# Patient Record
Sex: Female | Born: 1940 | Race: White | Hispanic: No | Marital: Married | Smoking: Never smoker
Health system: Southern US, Community
[De-identification: ages and names within clinical notes are randomized; demographics above are authoritative.]

## PROBLEM LIST (undated history)

## (undated) DIAGNOSIS — K635 Polyp of colon: Secondary | ICD-10-CM

## (undated) DIAGNOSIS — R030 Elevated blood-pressure reading, without diagnosis of hypertension: Secondary | ICD-10-CM

## (undated) DIAGNOSIS — M6530 Trigger finger, unspecified finger: Secondary | ICD-10-CM

## (undated) DIAGNOSIS — D369 Benign neoplasm, unspecified site: Secondary | ICD-10-CM

## (undated) DIAGNOSIS — IMO0001 Reserved for inherently not codable concepts without codable children: Secondary | ICD-10-CM

## (undated) DIAGNOSIS — K5790 Diverticulosis of intestine, part unspecified, without perforation or abscess without bleeding: Secondary | ICD-10-CM

## (undated) DIAGNOSIS — N390 Urinary tract infection, site not specified: Secondary | ICD-10-CM

## (undated) DIAGNOSIS — N6019 Diffuse cystic mastopathy of unspecified breast: Secondary | ICD-10-CM

## (undated) DIAGNOSIS — J189 Pneumonia, unspecified organism: Secondary | ICD-10-CM

## (undated) HISTORY — DX: Elevated blood-pressure reading, without diagnosis of hypertension: R03.0

## (undated) HISTORY — DX: Pneumonia, unspecified organism: J18.9

## (undated) HISTORY — DX: Diverticulosis of intestine, part unspecified, without perforation or abscess without bleeding: K57.90

## (undated) HISTORY — DX: Urinary tract infection, site not specified: N39.0

## (undated) HISTORY — PX: REFRACTIVE SURGERY: SHX103

## (undated) HISTORY — DX: Polyp of colon: K63.5

## (undated) HISTORY — PX: TUBAL LIGATION: SHX77

## (undated) HISTORY — DX: Trigger finger, unspecified finger: M65.30

## (undated) HISTORY — DX: Benign neoplasm, unspecified site: D36.9

## (undated) HISTORY — DX: Diffuse cystic mastopathy of unspecified breast: N60.19

## (undated) HISTORY — PX: APPENDECTOMY: SHX54

## (undated) HISTORY — PX: CATARACT EXTRACTION: SUR2

## (undated) HISTORY — DX: Reserved for inherently not codable concepts without codable children: IMO0001

---
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
## 1979-03-23 HISTORY — PX: WISDOM TOOTH EXTRACTION: SHX21

## 1998-10-06 ENCOUNTER — Ambulatory Visit (HOSPITAL_COMMUNITY): Admission: RE | Admit: 1998-10-06 | Discharge: 1998-10-06 | Payer: Self-pay | Admitting: Internal Medicine

## 1998-10-06 ENCOUNTER — Encounter: Payer: Self-pay | Admitting: Internal Medicine

## 1999-04-15 ENCOUNTER — Encounter: Admission: RE | Admit: 1999-04-15 | Discharge: 1999-04-15 | Payer: Self-pay | Admitting: Internal Medicine

## 1999-04-15 ENCOUNTER — Encounter: Payer: Self-pay | Admitting: Internal Medicine

## 2000-04-15 ENCOUNTER — Encounter: Payer: Self-pay | Admitting: Internal Medicine

## 2000-04-15 ENCOUNTER — Encounter: Admission: RE | Admit: 2000-04-15 | Discharge: 2000-04-15 | Payer: Self-pay | Admitting: Internal Medicine

## 2000-04-18 ENCOUNTER — Other Ambulatory Visit: Admission: RE | Admit: 2000-04-18 | Discharge: 2000-04-18 | Payer: Self-pay | Admitting: Obstetrics and Gynecology

## 2000-04-19 ENCOUNTER — Encounter: Payer: Self-pay | Admitting: Internal Medicine

## 2000-04-19 ENCOUNTER — Encounter: Admission: RE | Admit: 2000-04-19 | Discharge: 2000-04-19 | Payer: Self-pay | Admitting: Internal Medicine

## 2001-01-19 ENCOUNTER — Emergency Department (HOSPITAL_COMMUNITY): Admission: EM | Admit: 2001-01-19 | Discharge: 2001-01-20 | Payer: Self-pay | Admitting: Emergency Medicine

## 2002-07-12 ENCOUNTER — Other Ambulatory Visit: Admission: RE | Admit: 2002-07-12 | Discharge: 2002-07-12 | Payer: Self-pay | Admitting: Gynecology

## 2002-08-21 DIAGNOSIS — K635 Polyp of colon: Secondary | ICD-10-CM

## 2002-08-21 HISTORY — DX: Polyp of colon: K63.5

## 2003-08-07 ENCOUNTER — Other Ambulatory Visit: Admission: RE | Admit: 2003-08-07 | Discharge: 2003-08-07 | Payer: Self-pay | Admitting: Gynecology

## 2004-03-22 HISTORY — PX: EXPLORATORY LAPAROTOMY: SUR591

## 2004-07-07 ENCOUNTER — Ambulatory Visit: Payer: Self-pay | Admitting: Internal Medicine

## 2004-07-14 ENCOUNTER — Ambulatory Visit: Payer: Self-pay | Admitting: Internal Medicine

## 2004-08-11 ENCOUNTER — Other Ambulatory Visit: Admission: RE | Admit: 2004-08-11 | Discharge: 2004-08-11 | Payer: Self-pay | Admitting: Gynecology

## 2005-01-13 ENCOUNTER — Ambulatory Visit: Payer: Self-pay | Admitting: Internal Medicine

## 2005-01-22 ENCOUNTER — Ambulatory Visit: Payer: Self-pay | Admitting: Internal Medicine

## 2005-01-24 ENCOUNTER — Ambulatory Visit: Payer: Self-pay | Admitting: Physical Medicine & Rehabilitation

## 2005-01-24 ENCOUNTER — Inpatient Hospital Stay (HOSPITAL_COMMUNITY): Admission: EM | Admit: 2005-01-24 | Discharge: 2005-01-29 | Payer: Self-pay | Admitting: Emergency Medicine

## 2005-01-29 ENCOUNTER — Encounter (HOSPITAL_COMMUNITY)
Admission: RE | Admit: 2005-01-29 | Discharge: 2005-02-03 | Payer: Self-pay | Admitting: Physical Medicine & Rehabilitation

## 2005-01-29 ENCOUNTER — Inpatient Hospital Stay
Admission: RE | Admit: 2005-01-29 | Discharge: 2005-02-09 | Payer: Self-pay | Admitting: Physical Medicine & Rehabilitation

## 2005-03-25 ENCOUNTER — Ambulatory Visit: Payer: Self-pay | Admitting: Internal Medicine

## 2005-06-14 ENCOUNTER — Ambulatory Visit: Payer: Self-pay | Admitting: Internal Medicine

## 2005-08-12 ENCOUNTER — Other Ambulatory Visit: Admission: RE | Admit: 2005-08-12 | Discharge: 2005-08-12 | Payer: Self-pay | Admitting: Gynecology

## 2005-12-14 ENCOUNTER — Ambulatory Visit: Payer: Self-pay | Admitting: Internal Medicine

## 2006-03-04 ENCOUNTER — Ambulatory Visit: Payer: Self-pay | Admitting: Internal Medicine

## 2006-03-10 ENCOUNTER — Ambulatory Visit: Payer: Self-pay | Admitting: Internal Medicine

## 2006-03-22 HISTORY — PX: COLONOSCOPY: SHX174

## 2006-08-03 IMAGING — CT CT ABDOMEN W/O CM
2 of 4 series · 17 of 46 positions shown, 19 images · IV contrast (agent unspecified)
Comparison: 01/24/05.

CLINICAL DATA: Follow-up right lower quadrant pain.  Hematoma.  
 ABDOMEN CT WITHOUT CONTRAST:
TECHNIQUE: Multidetector CT imaging of the abdomen was performed following the standard protocol without IV contrast.
TECHNIQUE: Multidetector CT imaging of the pelvis was performed following the standard protocol without IV contrast.

[Series 2: abd/pelv w/o 5.0 b31f st · axial · non-contrast · 0.78mm/px · z∈[-430,-45]mm · 14 of 85 slices shown, 16 images]
[im 4/85  soft-tissue]
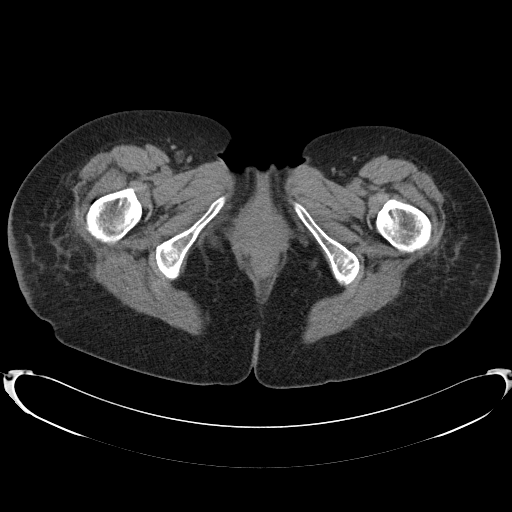
[im 4/85  bone]
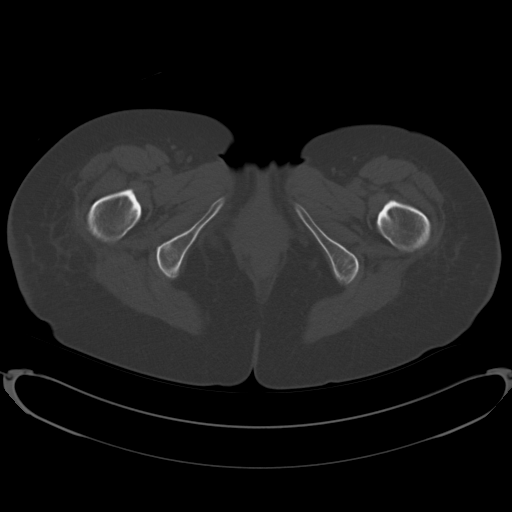
[im 10/85  soft-tissue]
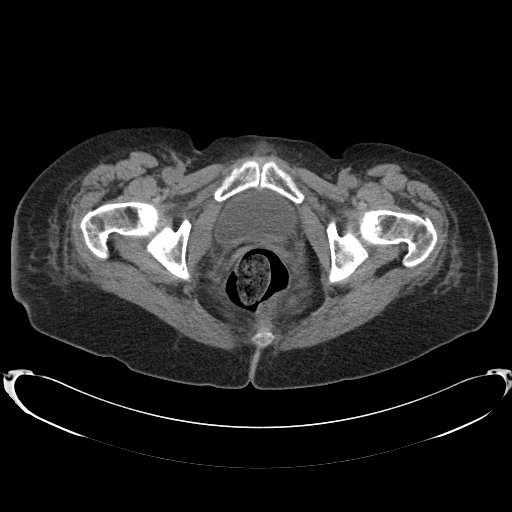
[im 16/85  soft-tissue]
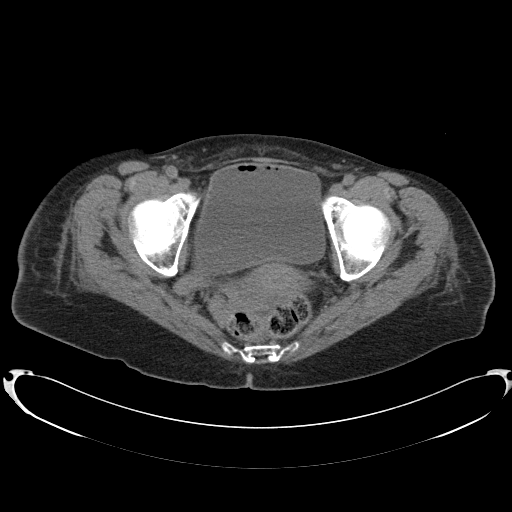
[im 22/85  soft-tissue]
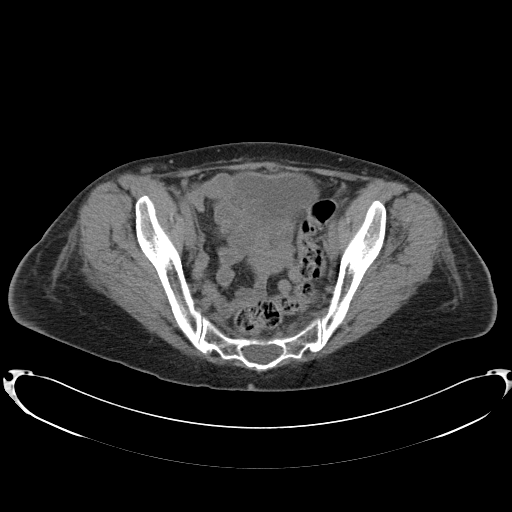
[im 29/85  soft-tissue]
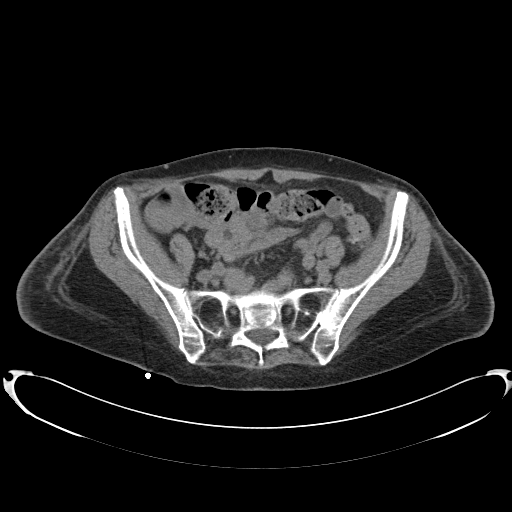
[im 35/85  soft-tissue]
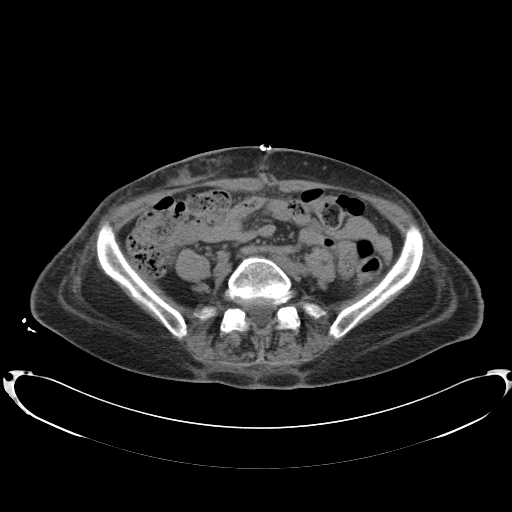
[im 41/85  soft-tissue]
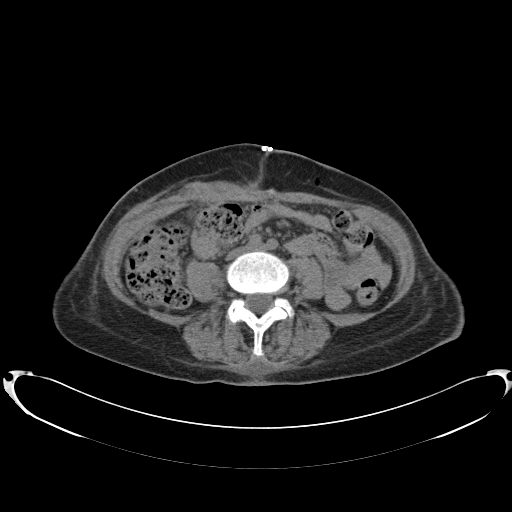
[im 44/85  soft-tissue]
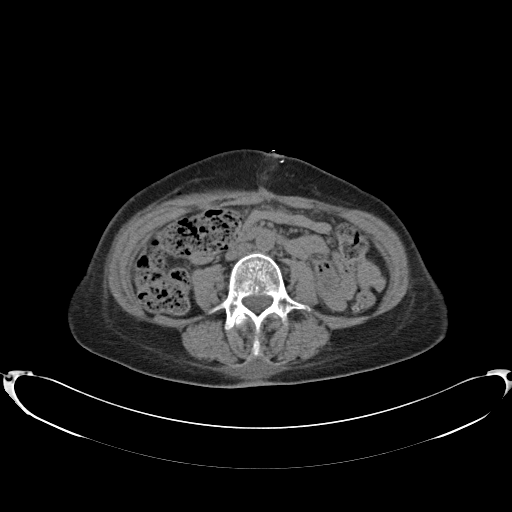
[im 50/85  soft-tissue]
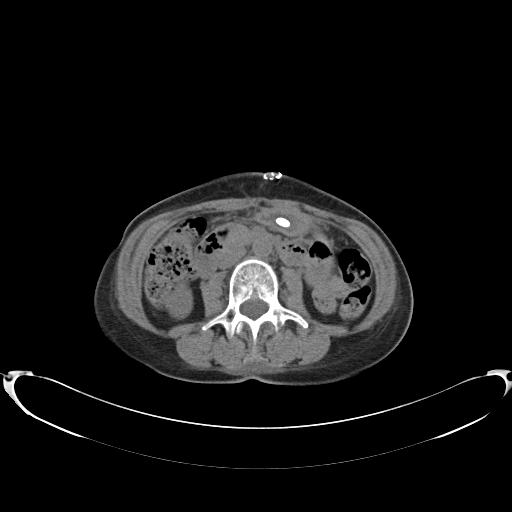
[im 50/85  bone]
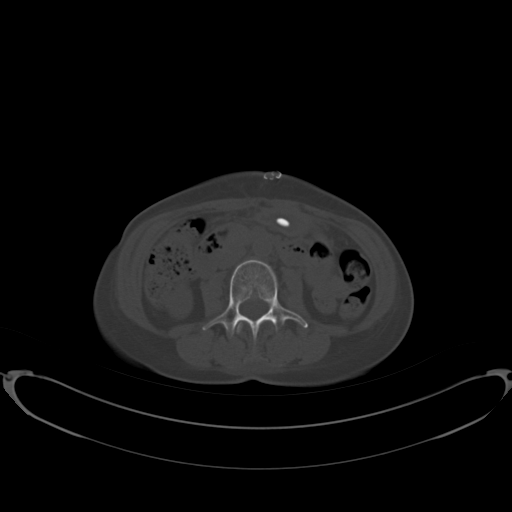
[im 57/85  soft-tissue]
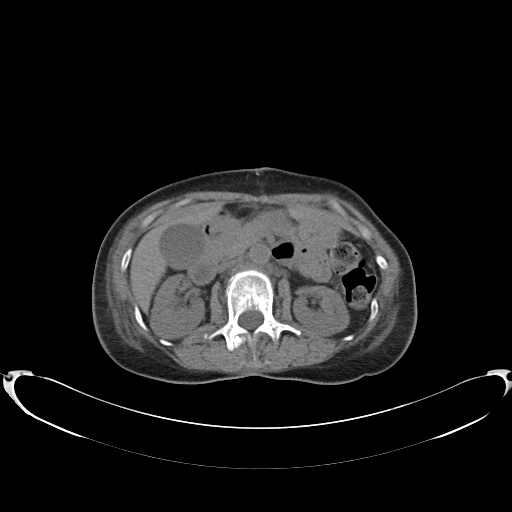
[im 63/85  soft-tissue]
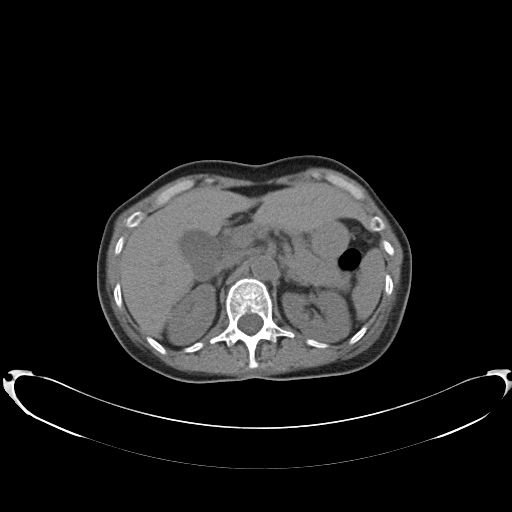
[im 69/85  soft-tissue]
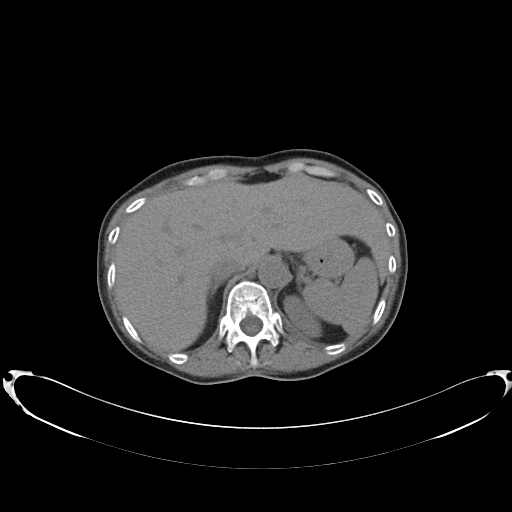
[im 75/85  soft-tissue]
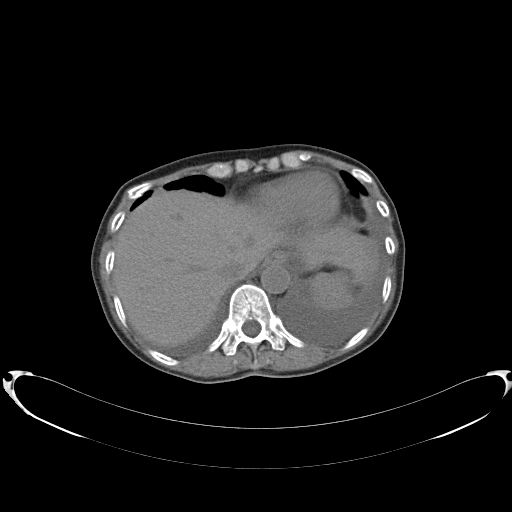
[im 81/85  soft-tissue]
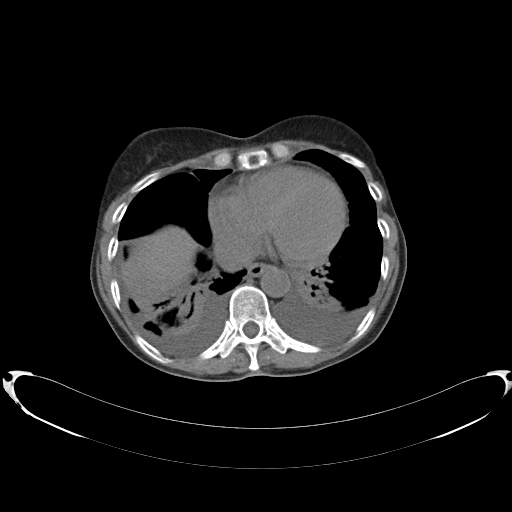

[Series 4: abd/pelv w/o 2.0 spo cor st · coronal · non-contrast · 0.83mm/px · 3 of 84 slices shown]
[im 28/84  soft-tissue]
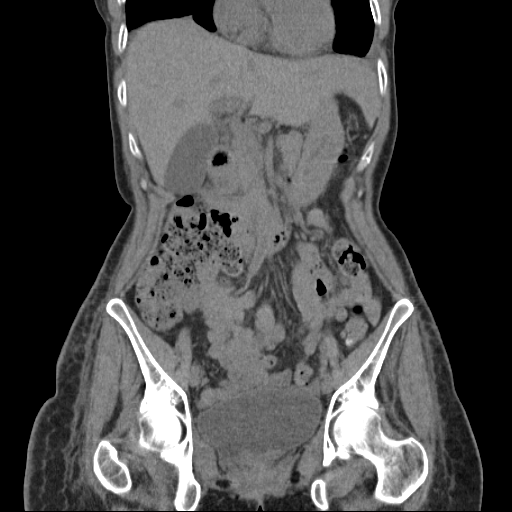
[im 37/84  soft-tissue]
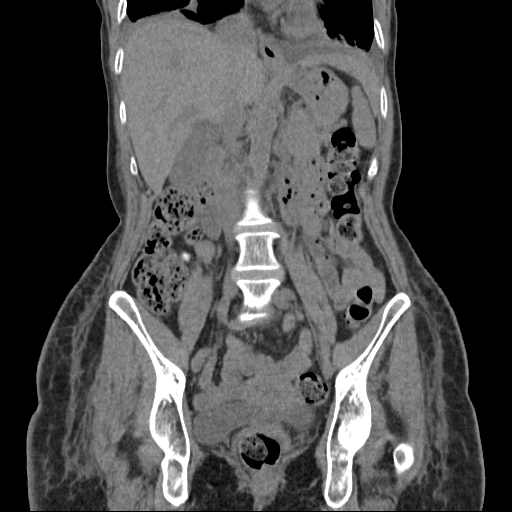
[im 47/84  soft-tissue]
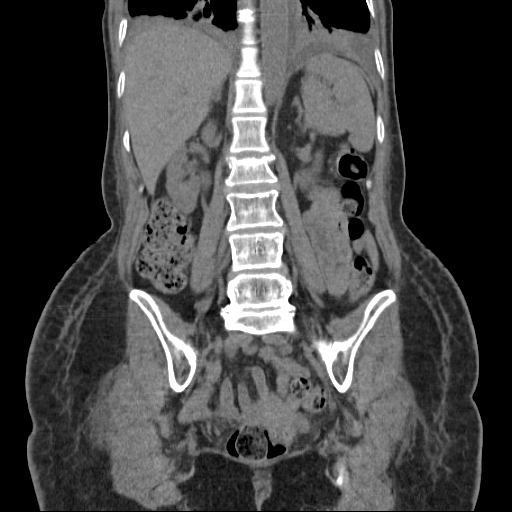

[17 of 46 positions shown; findings below may reference images not displayed]

FINDINGS: Bibasilar atelectasis and bilateral pleural effusions are present.  The right middle lobe nodule on image 4 is stable.  
 There is a small cyst in the anterior right lobe of the liver that is unchanged.  The liver is otherwise unremarkable.  The spleen, adrenal glands, gallbladder, pancreas are within normal limits.  A small hypodensity in the medial left kidney on image 26 is stable.  In the abdomen there is no evidence of free fluid.  There is no free intraperitoneal gas.  Postoperative changes at the midline are present.  Thickening of right abdominal wall musculature is present compatible with contusion.  Overlying subcutaneous hematoma has improved.
IMPRESSION: 1.  Right abdominal wall muscular contusion is again present.  Overlying hematoma has improved.  
 2.  No free intraperitoneal fluid or air.  
 3.  Postoperative changes. 
 4.  Bilateral effusions. 
 5.  Stable right middle lobe nodule.  
 PELVIS CT WITHOUT CONTRAST:
FINDINGS: Negative free fluid.  Negative abnormal adenopathy.  The uterus and adnexa are within normal limits.  Air is present in the bladder.  Hematoma overlying the right lower quadrant abdominal wall musculature has improved.  Dilatation of the CSF space in the sacrum is present.  This is probably a variant of Tarlov cyst.
IMPRESSION: Improving hematoma overlying the right lower quadrant abdominal wall.

## 2006-08-09 ENCOUNTER — Ambulatory Visit: Payer: Self-pay | Admitting: Internal Medicine

## 2006-08-11 ENCOUNTER — Ambulatory Visit: Payer: Self-pay | Admitting: Gastroenterology

## 2006-08-30 ENCOUNTER — Ambulatory Visit: Payer: Self-pay | Admitting: Gastroenterology

## 2006-08-30 DIAGNOSIS — K5790 Diverticulosis of intestine, part unspecified, without perforation or abscess without bleeding: Secondary | ICD-10-CM

## 2006-08-30 HISTORY — DX: Diverticulosis of intestine, part unspecified, without perforation or abscess without bleeding: K57.90

## 2006-09-01 ENCOUNTER — Ambulatory Visit: Payer: Self-pay | Admitting: Internal Medicine

## 2006-12-22 ENCOUNTER — Ambulatory Visit: Payer: Self-pay | Admitting: Internal Medicine

## 2006-12-22 LAB — CONVERTED CEMR LAB
Bilirubin Urine: NEGATIVE
Crystals: NEGATIVE
Hemoglobin, Urine: NEGATIVE
Ketones, ur: NEGATIVE mg/dL
Mucus, UA: NEGATIVE
Nitrite: POSITIVE — AB
Specific Gravity, Urine: <=1.005 (ref 1.000–1.03)
Total Protein, Urine: NEGATIVE mg/dL
Urine Glucose: NEGATIVE mg/dL
Urobilinogen, UA: 0.2 (ref 0.0–1.0)
pH: 7 (ref 5.0–8.0)

## 2007-01-04 ENCOUNTER — Encounter: Payer: Self-pay | Admitting: Internal Medicine

## 2007-01-04 DIAGNOSIS — M810 Age-related osteoporosis without current pathological fracture: Secondary | ICD-10-CM | POA: Insufficient documentation

## 2007-01-26 ENCOUNTER — Encounter: Payer: Self-pay | Admitting: Internal Medicine

## 2007-01-26 ENCOUNTER — Ambulatory Visit: Payer: Self-pay | Admitting: Family Medicine

## 2007-02-06 ENCOUNTER — Ambulatory Visit: Payer: Self-pay | Admitting: Internal Medicine

## 2007-02-06 DIAGNOSIS — N3090 Cystitis, unspecified without hematuria: Secondary | ICD-10-CM | POA: Insufficient documentation

## 2007-02-06 DIAGNOSIS — D485 Neoplasm of uncertain behavior of skin: Secondary | ICD-10-CM

## 2007-02-06 DIAGNOSIS — M545 Low back pain, unspecified: Secondary | ICD-10-CM | POA: Insufficient documentation

## 2007-02-27 ENCOUNTER — Ambulatory Visit: Payer: Self-pay | Admitting: Internal Medicine

## 2007-04-04 ENCOUNTER — Ambulatory Visit: Payer: Self-pay | Admitting: Internal Medicine

## 2007-04-05 LAB — CONVERTED CEMR LAB
Bilirubin Urine: NEGATIVE
Hemoglobin, Urine: NEGATIVE
Ketones, ur: NEGATIVE mg/dL
Leukocytes, UA: NEGATIVE
Nitrite: NEGATIVE
Specific Gravity, Urine: <=1.005 (ref 1.000–1.03)
Total Protein, Urine: NEGATIVE mg/dL
Urine Glucose: NEGATIVE mg/dL
Urobilinogen, UA: 0.2 (ref 0.0–1.0)
pH: 6 (ref 5.0–8.0)

## 2007-06-27 ENCOUNTER — Ambulatory Visit: Payer: Self-pay | Admitting: Internal Medicine

## 2007-08-08 ENCOUNTER — Ambulatory Visit: Payer: Self-pay | Admitting: Internal Medicine

## 2007-08-08 DIAGNOSIS — R030 Elevated blood-pressure reading, without diagnosis of hypertension: Secondary | ICD-10-CM

## 2007-08-15 ENCOUNTER — Other Ambulatory Visit: Admission: RE | Admit: 2007-08-15 | Discharge: 2007-08-15 | Payer: Self-pay | Admitting: Gynecology

## 2007-08-15 ENCOUNTER — Encounter: Payer: Self-pay | Admitting: Internal Medicine

## 2008-01-30 ENCOUNTER — Ambulatory Visit: Payer: Self-pay | Admitting: Internal Medicine

## 2008-01-30 LAB — CONVERTED CEMR LAB
ALT: 19 units/L (ref 0–35)
AST: 22 units/L (ref 0–37)
Albumin: 4 g/dL (ref 3.5–5.2)
Alkaline Phosphatase: 45 units/L (ref 39–117)
BUN: 11 mg/dL (ref 6–23)
Bacteria, UA: NEGATIVE
Basophils Absolute: 0 10*3/uL (ref 0.0–0.1)
Basophils Relative: 0.5 % (ref 0.0–3.0)
Bilirubin Urine: NEGATIVE
Bilirubin, Direct: 0.1 mg/dL (ref 0.0–0.3)
CO2: 30 meq/L (ref 19–32)
Calcium: 9.3 mg/dL (ref 8.4–10.5)
Chloride: 106 meq/L (ref 96–112)
Cholesterol: 155 mg/dL (ref 0–200)
Creatinine, Ser: 0.7 mg/dL (ref 0.4–1.2)
Crystals: NEGATIVE
Eosinophils Absolute: 0.1 10*3/uL (ref 0.0–0.7)
Eosinophils Relative: 1.7 % (ref 0.0–5.0)
GFR calc Af Amer: 107 mL/min
GFR calc non Af Amer: 89 mL/min
Glucose, Bld: 88 mg/dL (ref 70–99)
HCT: 39.7 % (ref 36.0–46.0)
HDL: 63.9 mg/dL (ref >39.0)
Hemoglobin, Urine: NEGATIVE
Hemoglobin: 13.7 g/dL (ref 12.0–15.0)
Ketones, ur: NEGATIVE mg/dL
LDL Cholesterol: 81 mg/dL (ref 0–99)
Lymphocytes Relative: 30 % (ref 12.0–46.0)
MCHC: 34.4 g/dL (ref 30.0–36.0)
MCV: 89.5 fL (ref 78.0–100.0)
Monocytes Absolute: 0.6 10*3/uL (ref 0.1–1.0)
Monocytes Relative: 10.7 % (ref 3.0–12.0)
Mucus, UA: NEGATIVE
Neutro Abs: 3 10*3/uL (ref 1.4–7.7)
Neutrophils Relative %: 57.1 % (ref 43.0–77.0)
Nitrite: NEGATIVE
Platelets: 244 10*3/uL (ref 150–400)
Potassium: 4.5 meq/L (ref 3.5–5.1)
RBC / HPF: NONE SEEN
RBC: 4.43 M/uL (ref 3.87–5.11)
RDW: 11.9 % (ref 11.5–14.6)
Sodium: 144 meq/L (ref 135–145)
Specific Gravity, Urine: <=1.005 (ref 1.000–1.03)
TSH: 3.12 microintl units/mL (ref 0.35–5.50)
Total Bilirubin: 0.8 mg/dL (ref 0.3–1.2)
Total CHOL/HDL Ratio: 2.4
Total Protein, Urine: NEGATIVE mg/dL
Total Protein: 6.3 g/dL (ref 6.0–8.3)
Triglycerides: 52 mg/dL (ref 0–149)
Urine Glucose: NEGATIVE mg/dL
Urobilinogen, UA: 0.2 (ref 0.0–1.0)
VLDL: 10 mg/dL (ref 0–40)
WBC: 5.3 10*3/uL (ref 4.5–10.5)
pH: 6.5 (ref 5.0–8.0)

## 2008-02-06 ENCOUNTER — Ambulatory Visit: Payer: Self-pay | Admitting: Internal Medicine

## 2008-06-20 ENCOUNTER — Encounter: Payer: Self-pay | Admitting: Internal Medicine

## 2008-08-06 ENCOUNTER — Ambulatory Visit: Payer: Self-pay | Admitting: Internal Medicine

## 2008-08-26 ENCOUNTER — Encounter: Payer: Self-pay | Admitting: Internal Medicine

## 2009-02-04 ENCOUNTER — Ambulatory Visit: Payer: Self-pay | Admitting: Internal Medicine

## 2009-03-04 ENCOUNTER — Ambulatory Visit: Payer: Self-pay | Admitting: Internal Medicine

## 2009-03-04 ENCOUNTER — Encounter: Payer: Self-pay | Admitting: Internal Medicine

## 2009-08-04 ENCOUNTER — Ambulatory Visit: Payer: Self-pay | Admitting: Internal Medicine

## 2009-08-04 LAB — CONVERTED CEMR LAB
ALT: 23 units/L (ref 0–35)
AST: 26 units/L (ref 0–37)
Albumin: 4.2 g/dL (ref 3.5–5.2)
Alkaline Phosphatase: 55 units/L (ref 39–117)
BUN: 13 mg/dL (ref 6–23)
Basophils Absolute: 0 10*3/uL (ref 0.0–0.1)
Basophils Relative: 0.4 % (ref 0.0–3.0)
Bilirubin Urine: NEGATIVE
Bilirubin, Direct: 0.1 mg/dL (ref 0.0–0.3)
CO2: 32 meq/L (ref 19–32)
Calcium: 9.4 mg/dL (ref 8.4–10.5)
Chloride: 103 meq/L (ref 96–112)
Cholesterol: 187 mg/dL (ref 0–200)
Creatinine, Ser: 0.7 mg/dL (ref 0.4–1.2)
Eosinophils Absolute: 0.2 10*3/uL (ref 0.0–0.7)
Eosinophils Relative: 2.3 % (ref 0.0–5.0)
GFR calc non Af Amer: 88.19 mL/min (ref >60)
Glucose, Bld: 94 mg/dL (ref 70–99)
HCT: 41.5 % (ref 36.0–46.0)
Hemoglobin, Urine: NEGATIVE
Ketones, ur: NEGATIVE mg/dL
LDL Cholesterol: 92 mg/dL (ref 0–99)
Lymphocytes Relative: 27.7 % (ref 12.0–46.0)
Lymphs Abs: 2 10*3/uL (ref 0.7–4.0)
MCHC: 34 g/dL (ref 30.0–36.0)
MCV: 89.8 fL (ref 78.0–100.0)
Monocytes Absolute: 0.7 10*3/uL (ref 0.1–1.0)
Monocytes Relative: 10.2 % (ref 3.0–12.0)
Neutro Abs: 4.2 10*3/uL (ref 1.4–7.7)
Neutrophils Relative %: 59.4 % (ref 43.0–77.0)
Nitrite: NEGATIVE
Platelets: 255 10*3/uL (ref 150.0–400.0)
Potassium: 4.4 meq/L (ref 3.5–5.1)
RBC: 4.63 M/uL (ref 3.87–5.11)
RDW: 13.7 % (ref 11.5–14.6)
Sodium: 142 meq/L (ref 135–145)
Specific Gravity, Urine: 1.01 (ref 1.000–1.030)
TSH: 4.41 microintl units/mL (ref 0.35–5.50)
Total Bilirubin: 0.7 mg/dL (ref 0.3–1.2)
Total CHOL/HDL Ratio: 2
Total Protein: 6.5 g/dL (ref 6.0–8.3)
Triglycerides: 79 mg/dL (ref 0.0–149.0)
Urine Glucose: NEGATIVE mg/dL
Urobilinogen, UA: 0.2 (ref 0.0–1.0)
VLDL: 15.8 mg/dL (ref 0.0–40.0)
Vitamin B-12: 846 pg/mL (ref 211–911)
WBC: 7.1 10*3/uL (ref 4.5–10.5)
pH: 7 (ref 5.0–8.0)

## 2009-08-11 ENCOUNTER — Ambulatory Visit: Payer: Self-pay | Admitting: Internal Medicine

## 2009-08-26 ENCOUNTER — Encounter: Payer: Self-pay | Admitting: Internal Medicine

## 2009-08-26 ENCOUNTER — Ambulatory Visit: Payer: Self-pay | Admitting: Gynecology

## 2009-08-26 ENCOUNTER — Other Ambulatory Visit: Admission: RE | Admit: 2009-08-26 | Discharge: 2009-08-26 | Payer: Self-pay | Admitting: Gynecology

## 2009-08-29 ENCOUNTER — Encounter: Payer: Self-pay | Admitting: Internal Medicine

## 2010-02-03 ENCOUNTER — Ambulatory Visit: Payer: Self-pay | Admitting: Internal Medicine

## 2010-02-04 LAB — CONVERTED CEMR LAB
ALT: 21 units/L (ref 0–35)
AST: 24 units/L (ref 0–37)
Albumin: 4.1 g/dL (ref 3.5–5.2)
Alkaline Phosphatase: 54 units/L (ref 39–117)
BUN: 14 mg/dL (ref 6–23)
Basophils Absolute: 0 10*3/uL (ref 0.0–0.1)
Basophils Relative: 0.8 % (ref 0.0–3.0)
Bilirubin Urine: NEGATIVE
Bilirubin, Direct: 0.1 mg/dL (ref 0.0–0.3)
CO2: 32 meq/L (ref 19–32)
Calcium: 9.7 mg/dL (ref 8.4–10.5)
Chloride: 104 meq/L (ref 96–112)
Cholesterol: 185 mg/dL (ref 0–200)
Creatinine, Ser: 0.7 mg/dL (ref 0.4–1.2)
Eosinophils Absolute: 0.2 10*3/uL (ref 0.0–0.7)
Eosinophils Relative: 3 % (ref 0.0–5.0)
GFR calc non Af Amer: 82.59 mL/min (ref >60)
Glucose, Bld: 99 mg/dL (ref 70–99)
HCT: 41.8 % (ref 36.0–46.0)
HDL: 72.9 mg/dL (ref >39.00)
Hemoglobin, Urine: NEGATIVE
Hemoglobin: 14.3 g/dL (ref 12.0–15.0)
Ketones, ur: NEGATIVE mg/dL
LDL Cholesterol: 98 mg/dL (ref 0–99)
Leukocytes, UA: NEGATIVE
Lymphocytes Relative: 32.6 % (ref 12.0–46.0)
Lymphs Abs: 1.7 10*3/uL (ref 0.7–4.0)
MCHC: 34.3 g/dL (ref 30.0–36.0)
MCV: 91 fL (ref 78.0–100.0)
Monocytes Absolute: 0.5 10*3/uL (ref 0.1–1.0)
Monocytes Relative: 9.5 % (ref 3.0–12.0)
Neutro Abs: 2.8 10*3/uL (ref 1.4–7.7)
Neutrophils Relative %: 54.1 % (ref 43.0–77.0)
Nitrite: NEGATIVE
Platelets: 265 10*3/uL (ref 150.0–400.0)
Potassium: 6 meq/L — ABNORMAL HIGH (ref 3.5–5.1)
RBC: 4.59 M/uL (ref 3.87–5.11)
RDW: 12.9 % (ref 11.5–14.6)
Sodium: 142 meq/L (ref 135–145)
Specific Gravity, Urine: <=1.005 (ref 1.000–1.030)
TSH: 3.95 microintl units/mL (ref 0.35–5.50)
Total Bilirubin: 0.7 mg/dL (ref 0.3–1.2)
Total CHOL/HDL Ratio: 3
Total Protein, Urine: NEGATIVE mg/dL
Total Protein: 6.3 g/dL (ref 6.0–8.3)
Triglycerides: 71 mg/dL (ref 0.0–149.0)
Urine Glucose: NEGATIVE mg/dL
Urobilinogen, UA: 0.2 (ref 0.0–1.0)
VLDL: 14.2 mg/dL (ref 0.0–40.0)
WBC: 5.3 10*3/uL (ref 4.5–10.5)
pH: 7 (ref 5.0–8.0)

## 2010-02-17 ENCOUNTER — Ambulatory Visit: Payer: Self-pay | Admitting: Internal Medicine

## 2010-04-19 LAB — CONVERTED CEMR LAB
Bilirubin Urine: NEGATIVE
Crystals: NEGATIVE
Ketones, ur: NEGATIVE mg/dL
Mucus, UA: NEGATIVE
Nitrite: POSITIVE — AB
Specific Gravity, Urine: 1.01 (ref 1.000–1.03)
Total Protein, Urine: NEGATIVE mg/dL
Urine Glucose: NEGATIVE mg/dL
Urobilinogen, UA: 0.2 (ref 0.0–1.0)
pH: 6 (ref 5.0–8.0)
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
## 2010-04-21 NOTE — Letter (Signed)
Summary: [facility]   Imported By: Sherian Rein 09/24/2009 11:29:47  _____________________________________________________________________  External Attachment:    Type:   Image     Comment:   External Document
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
## 2010-06-24 ENCOUNTER — Ambulatory Visit: Payer: Self-pay | Admitting: Internal Medicine

## 2010-08-04 NOTE — Assessment & Plan Note (Signed)
[facility]                           PRIMARY CARE OFFICE NOTE   NAME:Staffa, DULA HAVLIK                      MRN:          [id number]  DATE:09/01/2006                            DOB:          11/30/1940    PROCEDURE:  Skin biopsy.   INDICATIONS FOR PROCEDURE:  Moles with irregular color on the left  anterior shin distally.   The risks including incomplete procedure, scar formation, infection,  bleeding as well as the benefits were explained to the patient in  detail. She agreed to proceed. She was placed in the decubitus position.   Mole #1 measuring 11 x 9 mm was prepped with dye and alcohol and  injected with 1 mL of 2% lidocaine with epinephrine. The margins were  marked with a skin marker. Shave biopsy was performed. Specimens sent to  the lab. The wound was aggressively treated with the hyfrecator beyond  the markings, tolerated well, complications none. A Band-Aid with  antibiotic ointment applied. Wound instructions provided. Specimen sent  to the lab.   Mole #2 measuring 11 x 3 mm below mole #1 was removed in identical  fashion, tolerated well, complications none. Specimen sent to the lab.     Georgina Quint. Plotnikov, MD  Electronically Signed    AVP/MedQ  DD: 09/02/2006  DT: 09/02/2006  Job #: [id number]
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
## 2010-08-18 ENCOUNTER — Other Ambulatory Visit (INDEPENDENT_AMBULATORY_CARE_PROVIDER_SITE_OTHER): Payer: 59

## 2010-08-18 ENCOUNTER — Other Ambulatory Visit: Payer: Self-pay | Admitting: Internal Medicine

## 2010-08-18 DIAGNOSIS — Z0000 Encounter for general adult medical examination without abnormal findings: Secondary | ICD-10-CM

## 2010-08-18 LAB — URINALYSIS
Ketones, ur: NEGATIVE
Specific Gravity, Urine: <=1.005 (ref 1.000–1.030)
Total Protein, Urine: NEGATIVE
Urine Glucose: NEGATIVE
pH: 5.5 (ref 5.0–8.0)

## 2010-08-18 LAB — CBC WITH DIFFERENTIAL/PLATELET
Basophils Relative: 0.4 % (ref 0.0–3.0)
Eosinophils Absolute: 0.2 10*3/uL (ref 0.0–0.7)
MCHC: 34 g/dL (ref 30.0–36.0)
MCV: 91.1 fl (ref 78.0–100.0)
Monocytes Absolute: 0.5 10*3/uL (ref 0.1–1.0)
Neutrophils Relative %: 64.1 % (ref 43.0–77.0)
Platelets: 240 10*3/uL (ref 150.0–400.0)

## 2010-08-18 LAB — HEPATIC FUNCTION PANEL
ALT: 20 U/L (ref 0–35)
Alkaline Phosphatase: 53 U/L (ref 39–117)
Bilirubin, Direct: 0.1 mg/dL (ref 0.0–0.3)
Total Protein: 6 g/dL (ref 6.0–8.3)

## 2010-08-18 LAB — BASIC METABOLIC PANEL
Calcium: 9.4 mg/dL (ref 8.4–10.5)
Chloride: 105 mEq/L (ref 96–112)
Creatinine, Ser: 0.7 mg/dL (ref 0.4–1.2)

## 2010-08-18 LAB — LIPID PANEL
Cholesterol: 159 mg/dL (ref 0–200)
Triglycerides: 64 mg/dL (ref 0.0–149.0)

## 2010-08-21 ENCOUNTER — Encounter: Payer: Self-pay | Admitting: Internal Medicine

## 2010-08-24 ENCOUNTER — Encounter: Payer: Self-pay | Admitting: Internal Medicine

## 2010-08-24 ENCOUNTER — Ambulatory Visit (INDEPENDENT_AMBULATORY_CARE_PROVIDER_SITE_OTHER): Payer: 59 | Admitting: Internal Medicine

## 2010-08-24 VITALS — BP 158/98 | HR 92 | Temp 98.6°F | Resp 16 | Ht 61.5 in | Wt 99.0 lb

## 2010-08-24 DIAGNOSIS — L570 Actinic keratosis: Secondary | ICD-10-CM | POA: Insufficient documentation

## 2010-08-24 DIAGNOSIS — Z0000 Encounter for general adult medical examination without abnormal findings: Secondary | ICD-10-CM

## 2010-08-24 DIAGNOSIS — Z136 Encounter for screening for cardiovascular disorders: Secondary | ICD-10-CM

## 2010-08-24 MED ORDER — RALOXIFENE HCL 60 MG PO TABS: 60.0000 mg | ORAL_TABLET | Freq: Every day | ORAL | Status: DC

## 2010-08-24 NOTE — Assessment & Plan Note (Addendum)
The patient is here for annual Medicare wellness examination and management of other chronic and acute problems.   The risk factors are reflected in the social history.  The roster of all physicians providing medical care to patient - is listed in the Snapshot section of the chart.  Activities of daily living:  The patient is 100% inedpendent in all ADLs: dressing, toileting, feeding as well as independent mobility  Home safety : The patient has smoke detectors in the home. They wear seatbelts.No firearms at home ( firearms are present in the home, kept in a safe fashion). There is no violence in the home.   There is no risks for hepatitis, STDs or HIV. There is no   history of blood transfusion. They have no travel history to infectious disease endemic areas of the world.  The patient has (has not) seen their dentist in the last six month. They have (not) seen their eye doctor in the last year. They deny (admit to) any hearing difficulty and have not had audiologic testing in the last year.  They do not  have excessive sun exposure. Discussed the need for sun protection: hats, long sleeves and use of sunscreen if there is significant sun exposure.   Diet: the importance of a healthy diet is discussed. They do have a healthy (unhealthy-high fat/fast food) diet.  The patient has a regular exercise program: _____20__ , _min___duration, __3___per week.  The benefits of regular aerobic exercise were discussed.  Depression screen: there are no signs or vegative symptoms of depression- irritability, change in appetite, anhedonia, sadness/tearfullness.  Cognitive assessment: the patient manages all their financial and personal affairs and is actively engaged. They could relate day,date,year and events; recalled 3/3 objects at 3 minutes; performed clock-face test normally.  The following portions of the patient's history were reviewed and updated as appropriate: allergies, current medications, past  family history, past medical history,  past surgical history, past social history  and problem list.  Vision, hearing, body mass index were assessed and reviewed.   During the course of the visit the patient was educated and counseled about appropriate screening and preventive services including : fall prevention , diabetes screening, nutrition counseling, colorectal cancer screening, and recommended immunizations.

## 2010-08-24 NOTE — Progress Notes (Signed)
  Subjective:    Patient ID: Joy Thompson, female    DOB: 1940/08/29, 70 y.o.   MRN: [id number]  HPI   The patient is here for a wellness exam. The patient has been doing well overall without major physical or psychological issues going on lately. Review of Systems  Constitutional: Negative.  Negative for fever, chills, diaphoresis, activity change, appetite change, fatigue and unexpected weight change.  HENT: Negative for hearing loss, ear pain, nosebleeds, congestion, sore throat, facial swelling, rhinorrhea, sneezing, mouth sores, trouble swallowing, neck pain, neck stiffness, postnasal drip, sinus pressure and tinnitus.   Eyes: Negative for pain, discharge, redness, itching and visual disturbance.  Respiratory: Negative for cough, chest tightness, shortness of breath, wheezing and stridor.   Cardiovascular: Negative for chest pain, palpitations and leg swelling.  Gastrointestinal: Negative for nausea, diarrhea, constipation, blood in stool, abdominal distention, anal bleeding and rectal pain.  Genitourinary: Negative for dysuria, urgency, frequency, hematuria, flank pain, vaginal bleeding, vaginal discharge, difficulty urinating, genital sores and pelvic pain.  Musculoskeletal: Negative for back pain, joint swelling, arthralgias and gait problem.  Skin: Negative.  Negative for rash.  Neurological: Negative for dizziness, tremors, seizures, syncope, speech difficulty, weakness, numbness and headaches.  Hematological: Negative for adenopathy. Does not bruise/bleed easily.  Psychiatric/Behavioral: Negative for suicidal ideas, behavioral problems, sleep disturbance, dysphoric mood and decreased concentration. The patient is not nervous/anxious.        Objective:   Physical Exam  Constitutional: She appears well-developed and well-nourished. No distress.       Thin, in NAD  HENT:  Head: Normocephalic.  Right Ear: External ear normal.  Left Ear: External ear normal.  Nose: Nose normal.   Mouth/Throat: Oropharynx is clear and moist.  Eyes: Conjunctivae are normal. Pupils are equal, round, and reactive to light. Right eye exhibits no discharge. Left eye exhibits no discharge.  Neck: Normal range of motion. Neck supple. No JVD present. No tracheal deviation present. No thyromegaly present.  Cardiovascular: Normal rate, regular rhythm and normal heart sounds.   Pulmonary/Chest: No stridor. No respiratory distress. She has no wheezes.  Abdominal: Soft. Bowel sounds are normal. She exhibits no distension and no mass. There is no tenderness. There is no rebound and no guarding.  Musculoskeletal: She exhibits no edema and no tenderness.  Lymphadenopathy:    She has no cervical adenopathy.  Neurological: She displays normal reflexes. No cranial nerve deficit. She exhibits normal muscle tone. Coordination normal.  Skin: No rash noted. No erythema.  Psychiatric: She has a normal mood and affect. Her behavior is normal. Judgment and thought content normal.  AK on chest and R thigh and r wrist      Lab Results  Component Value Date   WBC 5.9 08/18/2010   HGB 13.4 08/18/2010   HCT 39.5 08/18/2010   PLT 240.0 08/18/2010   CHOL 159 08/18/2010   TRIG 64.0 08/18/2010   HDL 72.60 08/18/2010   ALT 20 08/18/2010   AST 24 08/18/2010   NA 142 08/18/2010   K 4.3 08/18/2010   CL 105 08/18/2010   CREATININE 0.7 08/18/2010   BUN 12 08/18/2010   CO2 32 08/18/2010   TSH 3.47 08/18/2010     Assessment & Plan:

## 2010-09-09 ENCOUNTER — Encounter: Payer: Self-pay | Admitting: Internal Medicine

## 2010-09-13 ENCOUNTER — Encounter: Payer: Self-pay | Admitting: Internal Medicine

## 2011-02-23 ENCOUNTER — Encounter: Payer: Self-pay | Admitting: Internal Medicine

## 2011-02-23 ENCOUNTER — Ambulatory Visit (INDEPENDENT_AMBULATORY_CARE_PROVIDER_SITE_OTHER): Payer: 59 | Admitting: Internal Medicine

## 2011-02-23 DIAGNOSIS — L570 Actinic keratosis: Secondary | ICD-10-CM

## 2011-02-23 DIAGNOSIS — R030 Elevated blood-pressure reading, without diagnosis of hypertension: Secondary | ICD-10-CM

## 2011-02-23 DIAGNOSIS — M24549 Contracture, unspecified hand: Secondary | ICD-10-CM | POA: Insufficient documentation

## 2011-02-23 NOTE — Progress Notes (Signed)
  Subjective:    Patient ID: Joy Thompson, female    DOB: Mar 30, 1940, 70 y.o.   MRN: [id number]  HPI  F/u osteoporosis C/o R 5th finger bent and lumpy C/o a lesion R arm  Review of Systems  Constitutional: Negative for chills, activity change, appetite change, fatigue and unexpected weight change.  HENT: Negative for congestion, mouth sores and sinus pressure.   Eyes: Negative for visual disturbance.  Respiratory: Negative for cough and chest tightness.   Gastrointestinal: Negative for nausea and abdominal pain.  Genitourinary: Negative for frequency, difficulty urinating and vaginal pain.  Musculoskeletal: Negative for back pain and gait problem.  Skin: Negative for pallor and rash.       A spot R arm  Neurological: Negative for dizziness, tremors, weakness, numbness and headaches.  Psychiatric/Behavioral: Negative for confusion and sleep disturbance.       Objective:   Physical Exam  Constitutional: She appears well-developed and well-nourished. No distress.  HENT:  Head: Normocephalic.  Right Ear: External ear normal.  Left Ear: External ear normal.  Nose: Nose normal.  Mouth/Throat: Oropharynx is clear and moist.  Eyes: Conjunctivae are normal. Pupils are equal, round, and reactive to light. Right eye exhibits no discharge. Left eye exhibits no discharge.  Neck: Normal range of motion. Neck supple. No JVD present. No tracheal deviation present. No thyromegaly present.  Cardiovascular: Normal rate, regular rhythm and normal heart sounds.   Pulmonary/Chest: No stridor. No respiratory distress. She has no wheezes.  Abdominal: Soft. Bowel sounds are normal. She exhibits no distension and no mass. There is no tenderness. There is no rebound and no guarding.  Musculoskeletal: She exhibits no edema and no tenderness.       Contracture 5th digit  Lymphadenopathy:    She has no cervical adenopathy.  Neurological: She displays normal reflexes. No cranial nerve deficit. She  exhibits normal muscle tone. Coordination normal.  Skin: Rash (AK R arm) noted. No erythema.  Psychiatric: She has a normal mood and affect. Her behavior is normal. Judgment and thought content normal.     Procedure Note :     Procedure : Cryosurgery   Indication:  Actinic keratosis(es)   Risks including unsuccessful procedure , bleeding, infection, bruising, scar, a need for a repeat  procedure and others were explained to the patient in detail as well as the benefits. Informed consent was obtained verbally.    1 lesion(s)  on  R arm  was/were treated with liquid nitrogen on a Q-tip in a usual fasion . Band-Aid was applied and antibiotic ointment was given for a later use.   Tolerated well. Complications none.   Postprocedure instructions :     Keep the wounds clean. You can wash them with liquid soap and water. Pat dry with gauze or a Kleenex tissue  Before applying antibiotic ointment and a Band-Aid.   You need to report immediately  if  any signs of infection develop.          Assessment & Plan:

## 2011-02-23 NOTE — Patient Instructions (Signed)
Postprocedure instructions :     Keep the wounds clean. You can wash them with liquid soap and water. Pat dry with gauze or a Kleenex tissue  Before applying antibiotic ointment and a Band-Aid.   You need to report immediately  if  any signs of infection develop.    Can try Vit E 400 u a day

## 2011-02-23 NOTE — Assessment & Plan Note (Signed)
Procedure Note :     Procedure : Cryosurgery   Indication:    Actinic keratosis(es) R arm x 1   Risks including unsuccessful procedure , bleeding, infection, bruising, scar, a need for a repeat  procedure and others were explained to the patient in detail as well as the benefits. Informed consent was obtained verbally.    1 lesion(s)  on    R arm was/were treated with liquid nitrogen on a Q-tip in a usual fasion . Band-Aid was applied and antibiotic ointment was given for a later use.   Tolerated well. Complications none.   Postprocedure instructions :     Keep the wounds clean. You can wash them with liquid soap and water. Pat dry with gauze or a Kleenex tissue  Before applying antibiotic ointment and a Band-Aid.   You need to report immediately  if  any signs of infection develop.

## 2011-02-24 NOTE — Assessment & Plan Note (Signed)
Will inject if worse

## 2011-02-24 NOTE — Assessment & Plan Note (Signed)
Normal BP at home.

## 2011-08-11 ENCOUNTER — Other Ambulatory Visit (INDEPENDENT_AMBULATORY_CARE_PROVIDER_SITE_OTHER): Payer: 59

## 2011-08-11 ENCOUNTER — Other Ambulatory Visit: Payer: Self-pay | Admitting: *Deleted

## 2011-08-11 DIAGNOSIS — Z0000 Encounter for general adult medical examination without abnormal findings: Secondary | ICD-10-CM

## 2011-08-11 LAB — LIPID PANEL
HDL: 68.5 mg/dL (ref >39.00)
VLDL: 11 mg/dL (ref 0.0–40.0)

## 2011-08-11 LAB — HEPATIC FUNCTION PANEL
AST: 23 U/L (ref 0–37)
Alkaline Phosphatase: 47 U/L (ref 39–117)
Bilirubin, Direct: 0.1 mg/dL (ref 0.0–0.3)
Total Bilirubin: 0.5 mg/dL (ref 0.3–1.2)

## 2011-08-11 LAB — CBC WITH DIFFERENTIAL/PLATELET
Basophils Absolute: 0 10*3/uL (ref 0.0–0.1)
Eosinophils Absolute: 0.1 10*3/uL (ref 0.0–0.7)
Eosinophils Relative: 2.5 % (ref 0.0–5.0)
MCV: 90.6 fl (ref 78.0–100.0)
Monocytes Absolute: 0.6 10*3/uL (ref 0.1–1.0)
Neutrophils Relative %: 59.1 % (ref 43.0–77.0)
Platelets: 251 10*3/uL (ref 150.0–400.0)
WBC: 5.6 10*3/uL (ref 4.5–10.5)

## 2011-08-11 LAB — URINALYSIS, ROUTINE W REFLEX MICROSCOPIC
Bilirubin Urine: NEGATIVE
Ketones, ur: NEGATIVE
Total Protein, Urine: NEGATIVE
pH: 6.5 (ref 5.0–8.0)

## 2011-08-11 LAB — BASIC METABOLIC PANEL
GFR: 102.77 mL/min (ref >60.00)
Potassium: 4.2 mEq/L (ref 3.5–5.1)
Sodium: 142 mEq/L (ref 135–145)

## 2011-08-11 LAB — TSH: TSH: 4.52 u[IU]/mL (ref 0.35–5.50)

## 2011-08-24 ENCOUNTER — Ambulatory Visit (INDEPENDENT_AMBULATORY_CARE_PROVIDER_SITE_OTHER): Payer: 59 | Admitting: Internal Medicine

## 2011-08-24 ENCOUNTER — Encounter: Payer: Self-pay | Admitting: Internal Medicine

## 2011-08-24 VITALS — BP 160/90 | HR 88 | Temp 98.3°F | Resp 16 | Ht 62.0 in | Wt 102.0 lb

## 2011-08-24 DIAGNOSIS — Z0000 Encounter for general adult medical examination without abnormal findings: Secondary | ICD-10-CM

## 2011-08-24 DIAGNOSIS — M810 Age-related osteoporosis without current pathological fracture: Secondary | ICD-10-CM

## 2011-08-24 DIAGNOSIS — Z136 Encounter for screening for cardiovascular disorders: Secondary | ICD-10-CM

## 2011-08-24 MED ORDER — RALOXIFENE HCL 60 MG PO TABS: 60.0000 mg | ORAL_TABLET | Freq: Every day | ORAL | Status: DC

## 2011-08-24 NOTE — Assessment & Plan Note (Addendum)
Continue with current prescription therapy as reflected on the Med list. BDS next year

## 2011-08-24 NOTE — Assessment & Plan Note (Signed)

## 2011-08-24 NOTE — Progress Notes (Signed)
Patient ID: Joy Thompson, female   DOB: [birth year], 71 y.o.   MRN: [id number]  Subjective:    Patient ID: Joy Thompson, female    DOB: 19-Oct-1940, 71 y.o.   MRN: [id number]  HPI   The patient is here for a wellness exam. The patient has been doing well overall without major physical or psychological issues going on lately.  BP Readings from Last 3 Encounters:  08/24/11 160/90  02/23/11 150/90  08/24/10 158/98   Wt Readings from Last 3 Encounters:  08/24/11 102 lb (46.267 kg)  02/23/11 102 lb (46.267 kg)  08/24/10 99 lb (44.906 kg)      Review of Systems  Constitutional: Negative.  Negative for fever, chills, diaphoresis, activity change, appetite change, fatigue and unexpected weight change.  HENT: Negative for hearing loss, ear pain, nosebleeds, congestion, sore throat, facial swelling, rhinorrhea, sneezing, mouth sores, trouble swallowing, neck pain, neck stiffness, postnasal drip, sinus pressure and tinnitus.   Eyes: Negative for pain, discharge, redness, itching and visual disturbance.  Respiratory: Negative for cough, chest tightness, shortness of breath, wheezing and stridor.   Cardiovascular: Negative for chest pain, palpitations and leg swelling.  Gastrointestinal: Negative for nausea, diarrhea, constipation, blood in stool, abdominal distention, anal bleeding and rectal pain.  Genitourinary: Negative for dysuria, urgency, frequency, hematuria, flank pain, vaginal bleeding, vaginal discharge, difficulty urinating, genital sores and pelvic pain.  Musculoskeletal: Negative for back pain, joint swelling, arthralgias and gait problem.  Skin: Negative.  Negative for rash.  Neurological: Negative for dizziness, tremors, seizures, syncope, speech difficulty, weakness, numbness and headaches.  Hematological: Negative for adenopathy. Does not bruise/bleed easily.  Psychiatric/Behavioral: Negative for suicidal ideas, behavioral problems, sleep disturbance, dysphoric mood and  decreased concentration. The patient is not nervous/anxious.        Objective:   Physical Exam  Constitutional: She appears well-developed and well-nourished. No distress.       Thin, in NAD  HENT:  Head: Normocephalic.  Right Ear: External ear normal.  Left Ear: External ear normal.  Nose: Nose normal.  Mouth/Throat: Oropharynx is clear and moist.  Eyes: Conjunctivae are normal. Pupils are equal, round, and reactive to light. Right eye exhibits no discharge. Left eye exhibits no discharge.  Neck: Normal range of motion. Neck supple. No JVD present. No tracheal deviation present. No thyromegaly present.  Cardiovascular: Normal rate, regular rhythm and normal heart sounds.   Pulmonary/Chest: No stridor. No respiratory distress. She has no wheezes.  Abdominal: Soft. Bowel sounds are normal. She exhibits no distension and no mass. There is no tenderness. There is no rebound and no guarding.  Musculoskeletal: She exhibits no edema and no tenderness.  Lymphadenopathy:    She has no cervical adenopathy.  Neurological: She displays normal reflexes. No cranial nerve deficit. She exhibits normal muscle tone. Coordination normal.  Skin: No rash noted. No erythema.  Psychiatric: She has a normal mood and affect. Her behavior is normal. Judgment and thought content normal.  AK on chest and R thigh and r wrist      Lab Results  Component Value Date   WBC 5.6 08/11/2011   HGB 13.5 08/11/2011   HCT 40.8 08/11/2011   PLT 251.0 08/11/2011   CHOL 163 08/11/2011   TRIG 55.0 08/11/2011   HDL 68.50 08/11/2011   ALT 20 08/11/2011   AST 23 08/11/2011   NA 142 08/11/2011   K 4.2 08/11/2011   CL 104 08/11/2011   CREATININE 0.6 08/11/2011   BUN 14 08/11/2011  CO2 30 08/11/2011   TSH 4.52 08/11/2011     Assessment & Plan:

## 2011-08-24 NOTE — Patient Instructions (Signed)
Use over-the-counter  "cold" medicines  such as  "Advil cold",  "Mucinex" or" Mucinex D"  for cough and congestion.    "Afrin" nasal spray for nasal congestion as directed instead. Use" Delsym" or" Robitussin" cough syrup varietis for cough.    "Common cold" symptoms are usually triggered by a virus.  The antibiotics are usually not necessary. On average, a" viral cold" illness would take 4-7 days to resolve.

## 2011-09-15 ENCOUNTER — Encounter: Payer: Self-pay | Admitting: Internal Medicine

## 2011-12-01 ENCOUNTER — Telehealth: Payer: Self-pay | Admitting: *Deleted

## 2011-12-01 ENCOUNTER — Encounter: Payer: Self-pay | Admitting: Gynecology

## 2011-12-01 ENCOUNTER — Ambulatory Visit (INDEPENDENT_AMBULATORY_CARE_PROVIDER_SITE_OTHER): Payer: Medicare Other | Admitting: Gynecology

## 2011-12-01 VITALS — BP 142/90 | Ht 61.5 in | Wt 101.0 lb

## 2011-12-01 DIAGNOSIS — Z780 Asymptomatic menopausal state: Secondary | ICD-10-CM

## 2011-12-01 DIAGNOSIS — M810 Age-related osteoporosis without current pathological fracture: Secondary | ICD-10-CM

## 2011-12-01 DIAGNOSIS — R011 Cardiac murmur, unspecified: Secondary | ICD-10-CM

## 2011-12-01 NOTE — Patient Instructions (Signed)
Will call you with appointment with cardiologist. Remember you need bone density study this year. Please send [place] the hemocult stool cards in the mail.
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
## 2011-12-01 NOTE — Telephone Encounter (Signed)
Appointment with Dr.Lean 12/24/11 @ 10:45 am. Left message for pt to call.

## 2011-12-01 NOTE — Telephone Encounter (Signed)
Message copied by Aura Camps on Wed Dec 01, 2011 11:16 AM ------      Message from: Ok Edwards      Created: Wed Dec 01, 2011 11:04 AM       Victorino Dike, this patient needs an appointment with [facility] cardiology as a result of incidental finding at time of physical exam today whereby she was found to have a right parasternal systolic ejection murmur grade 2/6 with splitting of the second heart sound. Thank you

## 2011-12-01 NOTE — Telephone Encounter (Signed)
Pt informed with the below note. Dr.McLean is correct name of doctor.

## 2011-12-02 ENCOUNTER — Telehealth: Payer: Self-pay | Admitting: *Deleted

## 2011-12-02 NOTE — Telephone Encounter (Signed)
Pt was seen yesterday for her annual and no pap was done, new Pap smear screening guidelines discussed. Pt would like to come back and have pap done, pt said insurance will pay for it if coded correctly. She also was given a hemoccult card, pt asked if certain foods should be avoided prior to taking sample? Please advise

## 2011-12-02 NOTE — Telephone Encounter (Signed)
Pt informed with the below note. 

## 2011-12-02 NOTE — Telephone Encounter (Signed)
Explain  to patient that these are the guidelines but if she wants to come in we can do her Pap smear

## 2011-12-06 ENCOUNTER — Ambulatory Visit (INDEPENDENT_AMBULATORY_CARE_PROVIDER_SITE_OTHER): Payer: Medicare Other | Admitting: Gynecology

## 2011-12-06 ENCOUNTER — Encounter: Payer: Self-pay | Admitting: Gynecology

## 2011-12-06 ENCOUNTER — Other Ambulatory Visit (HOSPITAL_COMMUNITY)
Admission: RE | Admit: 2011-12-06 | Discharge: 2011-12-06 | Disposition: A | Discharge status: Home / Self Care | Payer: Medicare Other | Source: Ambulatory Visit | Attending: Gynecology | Admitting: Gynecology

## 2011-12-06 DIAGNOSIS — Z124 Encounter for screening for malignant neoplasm of cervix: Secondary | ICD-10-CM

## 2011-12-06 DIAGNOSIS — Z1151 Encounter for screening for human papillomavirus (HPV): Secondary | ICD-10-CM | POA: Insufficient documentation

## 2011-12-06 DIAGNOSIS — Z01419 Encounter for gynecological examination (general) (routine) without abnormal findings: Secondary | ICD-10-CM | POA: Insufficient documentation

## 2011-12-06 NOTE — Progress Notes (Signed)
The patient is a 71 year old who was seen the office on September 11 for gynecological exam see previous entry outlining specific details. Patient with normal gynecological examination with the exception of mild vaginal atrophy for which patient is asymptomatic. We had discussed the new Pap smear screening guidelines that states that after the age of 25 patient did not need Pap smear providing that it has been 20 years of no prior history of high-grade dysplasia. She follows into these guidelines but presented to the office today for Pap smear regardless and states that she has to pay for that she will do so. Pap smear was done today. Her last Pap smear was normal in 2011.

## 2011-12-08 ENCOUNTER — Other Ambulatory Visit: Payer: Self-pay | Admitting: Anesthesiology

## 2011-12-08 DIAGNOSIS — Z1211 Encounter for screening for malignant neoplasm of colon: Secondary | ICD-10-CM

## 2011-12-13 ENCOUNTER — Other Ambulatory Visit: Payer: Self-pay | Admitting: Gynecology

## 2011-12-13 DIAGNOSIS — Z1211 Encounter for screening for malignant neoplasm of colon: Secondary | ICD-10-CM

## 2011-12-24 ENCOUNTER — Encounter: Payer: Self-pay | Admitting: Cardiology

## 2011-12-24 ENCOUNTER — Ambulatory Visit (INDEPENDENT_AMBULATORY_CARE_PROVIDER_SITE_OTHER): Payer: Medicare Other | Admitting: Cardiology

## 2011-12-24 VITALS — BP 173/84 | HR 78 | Ht 62.0 in | Wt 102.5 lb

## 2011-12-24 DIAGNOSIS — R030 Elevated blood-pressure reading, without diagnosis of hypertension: Secondary | ICD-10-CM

## 2011-12-24 DIAGNOSIS — R011 Cardiac murmur, unspecified: Secondary | ICD-10-CM

## 2011-12-24 NOTE — Patient Instructions (Addendum)
Your physician recommends that you schedule a follow-up appointment  As needed with Dr. Shirlee Latch  Your physician has requested that you have an echocardiogram. Echocardiography is a painless test that uses sound waves to create images of your heart. It provides your doctor with information about the size and shape of your heart and how well your heart's chambers and valves are working. This procedure takes approximately one hour. There are no restrictions for this procedure.

## 2011-12-27 DIAGNOSIS — R011 Cardiac murmur, unspecified: Secondary | ICD-10-CM | POA: Insufficient documentation

## 2011-12-27 NOTE — Progress Notes (Signed)
Patient ID: Joy Thompson, female   DOB: 1941/01/04, 71 y.o.   MRN: [id number] PCP: Dr. Posey Rea  71 yo with minimal past medical history was referred for evaluation of murmur.  She has no known cardiac problems.  She did have a stress nuclear in 2002 for atypical chest pain that was normal per her report.  She denies chest pain.  She walks 30-60 minutes a day for exercise and denies exertional dyspnea.  No syncope or lightheadedness.  No palpitations.  She was told by her OB-GYN that she had a heart murmur recently.  This had not been heard in the past.  Also of note, her BP is elevated today but runs in the 100s-120s when she checks on her home cuff.   ECG: NSR, short PR interval, LAFB, Qs in V1 and V2  Labs (5/13): TSH normal, LDL 84, HDL 69  PMH: 1. Low back pain 2. Osteoporosis 3. Nuclear stress test in 2002 was normal per her report.  4. Murmur  SH: Married, nonsmoker, homemaker.   FH: Mother with HTN.   No CAD or congenital heart disease.   ROS: All systems reviewed and negative except as per HPI.   Current Outpatient Prescriptions  Medication Sig Dispense Refill  . aspirin 81 MG EC tablet Take 81 mg by mouth daily.        . calcium-vitamin D (OSCAL WITH D 500-200) 500-200 MG-UNIT per tablet Take 1.5 tablets by mouth.       . Cholecalciferol 1000 UNITS tablet Take 1,000 Units by mouth daily.        . fish oil-omega-3 fatty acids 1000 MG capsule Take 1 g by mouth daily.        . Multiple Vitamins-Minerals (MULTIVITAMIN,TX-MINERALS) tablet Take 1 tablet by mouth daily.        . raloxifene (EVISTA) 60 MG tablet Take 1 tablet (60 mg total) by mouth daily.  90 tablet  3    BP 173/84  Pulse 78  Ht 5\' 2"  (1.575 m)  Wt 102 lb 8 oz (46.494 kg)  BMI 18.75 kg/m2 General: NAD Neck: No JVD, no thyromegaly or thyroid nodule.  Lungs: Clear to auscultation bilaterally with normal respiratory effort. CV: Nondisplaced PMI.  Heart regular S1/S2, +S4, 1/6 systolic murmur RUSB and LLSB.  No  peripheral edema.  No carotid bruit.  Normal pedal pulses.  Abdomen: Soft, nontender, no hepatosplenomegaly, no distention.  Skin: Intact without lesions or rashes.  Neurologic: Alert and oriented x 3.  Psych: Normal affect. Extremities: No clubbing or cyanosis.  HEENT: Normal.   Assessment/Plan:  1. Murmur: Patient has an outflow-type, very soft systolic murmur.  This may be a benign flow murmur or perhaps some aortic sclerosis. I will get an echocardiogram to formerly evaluate.  2. High blood pressure: BP is high today.  This may be due to anxiety but she does appear to have an S4.  I have asked her to keep track of her BP at home for Dr. Posey Rea and to bring her BP monitor in for calibration.   Rockland Kotarski [employer]

## 2012-01-05 ENCOUNTER — Ambulatory Visit (HOSPITAL_COMMUNITY): Payer: Medicare Other | Attending: Cardiology

## 2012-01-05 DIAGNOSIS — R011 Cardiac murmur, unspecified: Secondary | ICD-10-CM | POA: Insufficient documentation

## 2012-01-05 DIAGNOSIS — I080 Rheumatic disorders of both mitral and aortic valves: Secondary | ICD-10-CM | POA: Insufficient documentation

## 2012-01-05 DIAGNOSIS — I379 Nonrheumatic pulmonary valve disorder, unspecified: Secondary | ICD-10-CM | POA: Insufficient documentation

## 2012-01-05 DIAGNOSIS — I369 Nonrheumatic tricuspid valve disorder, unspecified: Secondary | ICD-10-CM | POA: Insufficient documentation

## 2012-01-05 NOTE — Progress Notes (Signed)
Echocardiogram performed.  

## 2012-02-23 ENCOUNTER — Ambulatory Visit (INDEPENDENT_AMBULATORY_CARE_PROVIDER_SITE_OTHER): Payer: 59 | Admitting: Internal Medicine

## 2012-02-23 ENCOUNTER — Encounter: Payer: Self-pay | Admitting: Internal Medicine

## 2012-02-23 VITALS — BP 170/88 | HR 88 | Temp 99.0°F | Resp 16 | Wt 100.0 lb

## 2012-02-23 DIAGNOSIS — M545 Low back pain: Secondary | ICD-10-CM

## 2012-02-23 DIAGNOSIS — R011 Cardiac murmur, unspecified: Secondary | ICD-10-CM

## 2012-02-23 DIAGNOSIS — R030 Elevated blood-pressure reading, without diagnosis of hypertension: Secondary | ICD-10-CM

## 2012-02-23 DIAGNOSIS — M810 Age-related osteoporosis without current pathological fracture: Secondary | ICD-10-CM

## 2012-02-23 NOTE — Assessment & Plan Note (Signed)
Resolved

## 2012-02-23 NOTE — Progress Notes (Signed)
  Subjective:    HPI  The patient has been doing well overall without major physical or psychological issues going on lately. BP is nl at home  BP Readings from Last 3 Encounters:  02/23/12 170/88  12/24/11 173/84  12/01/11 142/90   Wt Readings from Last 3 Encounters:  02/23/12 100 lb (45.36 kg)  12/24/11 102 lb 8 oz (46.494 kg)  12/01/11 101 lb (45.813 kg)      Review of Systems  Constitutional: Negative.  Negative for fever, chills, diaphoresis, activity change, appetite change, fatigue and unexpected weight change.  HENT: Negative for hearing loss, ear pain, nosebleeds, congestion, sore throat, facial swelling, rhinorrhea, sneezing, mouth sores, trouble swallowing, neck pain, neck stiffness, postnasal drip, sinus pressure and tinnitus.   Eyes: Negative for pain, discharge, redness, itching and visual disturbance.  Respiratory: Negative for cough, chest tightness, shortness of breath, wheezing and stridor.   Cardiovascular: Negative for chest pain, palpitations and leg swelling.  Gastrointestinal: Negative for nausea, diarrhea, constipation, blood in stool, abdominal distention, anal bleeding and rectal pain.  Genitourinary: Negative for dysuria, urgency, frequency, hematuria, flank pain, vaginal bleeding, vaginal discharge, difficulty urinating, genital sores and pelvic pain.  Musculoskeletal: Negative for back pain, joint swelling, arthralgias and gait problem.  Skin: Negative.  Negative for rash.  Neurological: Negative for dizziness, tremors, seizures, syncope, speech difficulty, weakness, numbness and headaches.  Hematological: Negative for adenopathy. Does not bruise/bleed easily.  Psychiatric/Behavioral: Negative for suicidal ideas, behavioral problems, sleep disturbance, dysphoric mood and decreased concentration. The patient is not nervous/anxious.        Objective:   Physical Exam  Constitutional: She appears well-developed and well-nourished. No distress.   Thin, in NAD  HENT:  Head: Normocephalic.  Right Ear: External ear normal.  Left Ear: External ear normal.  Nose: Nose normal.  Mouth/Throat: Oropharynx is clear and moist.  Eyes: Conjunctivae normal are normal. Pupils are equal, round, and reactive to light. Right eye exhibits no discharge. Left eye exhibits no discharge.  Neck: Normal range of motion. Neck supple. No JVD present. No tracheal deviation present. No thyromegaly present.  Cardiovascular: Normal rate, regular rhythm and normal heart sounds.   Pulmonary/Chest: No stridor. No respiratory distress. She has no wheezes.  Abdominal: Soft. Bowel sounds are normal. She exhibits no distension and no mass. There is no tenderness. There is no rebound and no guarding.  Musculoskeletal: She exhibits no edema and no tenderness.  Lymphadenopathy:    She has no cervical adenopathy.  Neurological: She displays normal reflexes. No cranial nerve deficit. She exhibits normal muscle tone. Coordination normal.  Skin: No rash noted. No erythema.  Psychiatric: She has a normal mood and affect. Her behavior is normal. Judgment and thought content normal.        Lab Results  Component Value Date   WBC 5.6 08/11/2011   HGB 13.5 08/11/2011   HCT 40.8 08/11/2011   PLT 251.0 08/11/2011   CHOL 163 08/11/2011   TRIG 55.0 08/11/2011   HDL 68.50 08/11/2011   ALT 20 08/11/2011   AST 23 08/11/2011   NA 142 08/11/2011   K 4.2 08/11/2011   CL 104 08/11/2011   CREATININE 0.6 08/11/2011   BUN 14 08/11/2011   CO2 30 08/11/2011   TSH 4.52 08/11/2011     Assessment & Plan:

## 2012-02-23 NOTE — Assessment & Plan Note (Signed)
Normal BP at home.

## 2012-02-23 NOTE — Assessment & Plan Note (Signed)
Her ECHO was good

## 2012-02-23 NOTE — Assessment & Plan Note (Signed)
On Evista 

## 2012-06-28 ENCOUNTER — Telehealth: Payer: Self-pay | Admitting: Internal Medicine

## 2012-06-28 DIAGNOSIS — E785 Hyperlipidemia, unspecified: Secondary | ICD-10-CM

## 2012-06-28 DIAGNOSIS — Z0000 Encounter for general adult medical examination without abnormal findings: Secondary | ICD-10-CM

## 2012-06-28 NOTE — Telephone Encounter (Signed)
Labs for next appt.

## 2012-08-10 ENCOUNTER — Other Ambulatory Visit (INDEPENDENT_AMBULATORY_CARE_PROVIDER_SITE_OTHER): Payer: 59

## 2012-08-10 DIAGNOSIS — E785 Hyperlipidemia, unspecified: Secondary | ICD-10-CM

## 2012-08-10 DIAGNOSIS — Z0000 Encounter for general adult medical examination without abnormal findings: Secondary | ICD-10-CM

## 2012-08-10 LAB — URINALYSIS
Bilirubin Urine: NEGATIVE
Hgb urine dipstick: NEGATIVE
Total Protein, Urine: NEGATIVE
Urine Glucose: NEGATIVE
Urobilinogen, UA: 0.2 (ref 0.0–1.0)

## 2012-08-10 LAB — CBC WITH DIFFERENTIAL/PLATELET
Basophils Relative: 0.8 % (ref 0.0–3.0)
Eosinophils Relative: 3.6 % (ref 0.0–5.0)
Hemoglobin: 13.3 g/dL (ref 12.0–15.0)
Lymphocytes Relative: 35.4 % (ref 12.0–46.0)
Monocytes Relative: 10.3 % (ref 3.0–12.0)
Neutro Abs: 2.6 10*3/uL (ref 1.4–7.7)
RBC: 4.44 Mil/uL (ref 3.87–5.11)

## 2012-08-10 LAB — LIPID PANEL
HDL: 68.7 mg/dL (ref >39.00)
LDL Cholesterol: 84 mg/dL (ref 0–99)
Total CHOL/HDL Ratio: 2
VLDL: 17.6 mg/dL (ref 0.0–40.0)

## 2012-08-10 LAB — BASIC METABOLIC PANEL
CO2: 30 mEq/L (ref 19–32)
Calcium: 9 mg/dL (ref 8.4–10.5)
GFR: 90.4 mL/min (ref >60.00)
Potassium: 4.2 mEq/L (ref 3.5–5.1)
Sodium: 141 mEq/L (ref 135–145)

## 2012-08-10 LAB — HEPATIC FUNCTION PANEL
ALT: 21 U/L (ref 0–35)
Total Bilirubin: 0.4 mg/dL (ref 0.3–1.2)

## 2012-08-10 LAB — TSH: TSH: 3.9 u[IU]/mL (ref 0.35–5.50)

## 2012-08-23 ENCOUNTER — Ambulatory Visit (INDEPENDENT_AMBULATORY_CARE_PROVIDER_SITE_OTHER): Payer: 59 | Admitting: Internal Medicine

## 2012-08-23 ENCOUNTER — Encounter: Payer: Self-pay | Admitting: Internal Medicine

## 2012-08-23 VITALS — BP 160/90 | HR 80 | Temp 98.4°F | Resp 16 | Wt 98.0 lb

## 2012-08-23 DIAGNOSIS — M810 Age-related osteoporosis without current pathological fracture: Secondary | ICD-10-CM

## 2012-08-23 DIAGNOSIS — E785 Hyperlipidemia, unspecified: Secondary | ICD-10-CM

## 2012-08-23 DIAGNOSIS — R030 Elevated blood-pressure reading, without diagnosis of hypertension: Secondary | ICD-10-CM

## 2012-08-23 MED ORDER — RALOXIFENE HCL 60 MG PO TABS: 60.0000 mg | ORAL_TABLET | Freq: Every day | ORAL | Status: DC

## 2012-08-23 NOTE — Progress Notes (Signed)
   Subjective:    HPI  The patient has been doing well overall without major physical or psychological issues going on lately.   BP is nl at home  BP Readings from Last 3 Encounters:  08/23/12 160/90  02/23/12 170/88  12/24/11 173/84   Wt Readings from Last 3 Encounters:  08/23/12 98 lb (44.453 kg)  02/23/12 100 lb (45.36 kg)  12/24/11 102 lb 8 oz (46.494 kg)      Review of Systems  Constitutional: Negative.  Negative for fever, chills, diaphoresis, activity change, appetite change, fatigue and unexpected weight change.  HENT: Negative for hearing loss, ear pain, nosebleeds, congestion, sore throat, facial swelling, rhinorrhea, sneezing, mouth sores, trouble swallowing, neck pain, neck stiffness, postnasal drip, sinus pressure and tinnitus.   Eyes: Negative for pain, discharge, redness, itching and visual disturbance.  Respiratory: Negative for cough, chest tightness, shortness of breath, wheezing and stridor.   Cardiovascular: Negative for chest pain, palpitations and leg swelling.  Gastrointestinal: Negative for nausea, diarrhea, constipation, blood in stool, abdominal distention, anal bleeding and rectal pain.  Genitourinary: Negative for dysuria, urgency, frequency, hematuria, flank pain, vaginal bleeding, vaginal discharge, difficulty urinating, genital sores and pelvic pain.  Musculoskeletal: Negative for back pain, joint swelling, arthralgias and gait problem.  Skin: Negative.  Negative for rash.  Neurological: Negative for dizziness, tremors, seizures, syncope, speech difficulty, weakness, numbness and headaches.  Hematological: Negative for adenopathy. Does not bruise/bleed easily.  Psychiatric/Behavioral: Negative for suicidal ideas, behavioral problems, sleep disturbance, dysphoric mood and decreased concentration. The patient is not nervous/anxious.        Objective:   Physical Exam  Constitutional: She appears well-developed and well-nourished. No distress.   Thin, in NAD  HENT:  Head: Normocephalic.  Right Ear: External ear normal.  Left Ear: External ear normal.  Nose: Nose normal.  Mouth/Throat: Oropharynx is clear and moist.  Eyes: Conjunctivae are normal. Pupils are equal, round, and reactive to light. Right eye exhibits no discharge. Left eye exhibits no discharge.  Neck: Normal range of motion. Neck supple. No JVD present. No tracheal deviation present. No thyromegaly present.  Cardiovascular: Normal rate, regular rhythm and normal heart sounds.   Pulmonary/Chest: No stridor. No respiratory distress. She has no wheezes.  Abdominal: Soft. Bowel sounds are normal. She exhibits no distension and no mass. There is no tenderness. There is no rebound and no guarding.  Musculoskeletal: She exhibits no edema and no tenderness.  Lymphadenopathy:    She has no cervical adenopathy.  Neurological: She displays normal reflexes. No cranial nerve deficit. She exhibits normal muscle tone. Coordination normal.  Skin: No rash noted. No erythema.  Psychiatric: She has a normal mood and affect. Her behavior is normal. Judgment and thought content normal.        Lab Results  Component Value Date   WBC 5.3 08/10/2012   HGB 13.3 08/10/2012   HCT 39.4 08/10/2012   PLT 246.0 08/10/2012   CHOL 170 08/10/2012   TRIG 88.0 08/10/2012   HDL 68.70 08/10/2012   ALT 21 08/10/2012   AST 19 08/10/2012   NA 141 08/10/2012   K 4.2 08/10/2012   CL 105 08/10/2012   CREATININE 0.7 08/10/2012   BUN 11 08/10/2012   CO2 30 08/10/2012   TSH 3.90 08/10/2012     Assessment & Plan:

## 2012-08-23 NOTE — Assessment & Plan Note (Signed)
Nl BP at home 

## 2012-08-23 NOTE — Assessment & Plan Note (Signed)
Continue with current prescription therapy as reflected on the Med list.  

## 2012-10-06 ENCOUNTER — Encounter: Payer: Self-pay | Admitting: Internal Medicine

## 2013-02-08 ENCOUNTER — Other Ambulatory Visit (INDEPENDENT_AMBULATORY_CARE_PROVIDER_SITE_OTHER): Payer: 59

## 2013-02-08 DIAGNOSIS — R030 Elevated blood-pressure reading, without diagnosis of hypertension: Secondary | ICD-10-CM

## 2013-02-08 DIAGNOSIS — M810 Age-related osteoporosis without current pathological fracture: Secondary | ICD-10-CM

## 2013-02-08 DIAGNOSIS — E785 Hyperlipidemia, unspecified: Secondary | ICD-10-CM

## 2013-02-08 LAB — CBC WITH DIFFERENTIAL/PLATELET
Eosinophils Absolute: 0.2 10*3/uL (ref 0.0–0.7)
Lymphocytes Relative: 32.1 % (ref 12.0–46.0)
MCHC: 33.8 g/dL (ref 30.0–36.0)
MCV: 88.4 fl (ref 78.0–100.0)
Monocytes Absolute: 0.6 10*3/uL (ref 0.1–1.0)
Neutrophils Relative %: 52.5 % (ref 43.0–77.0)
Platelets: 271 10*3/uL (ref 150.0–400.0)
RBC: 4.47 Mil/uL (ref 3.87–5.11)
WBC: 5.3 10*3/uL (ref 4.5–10.5)

## 2013-02-08 LAB — BASIC METABOLIC PANEL
BUN: 6 mg/dL (ref 6–23)
CO2: 30 mEq/L (ref 19–32)
Calcium: 9.3 mg/dL (ref 8.4–10.5)
Chloride: 104 mEq/L (ref 96–112)
Creatinine, Ser: 0.6 mg/dL (ref 0.4–1.2)
GFR: 98.6 mL/min (ref >60.00)
Potassium: 4.2 mEq/L (ref 3.5–5.1)

## 2013-02-08 LAB — URINALYSIS, ROUTINE W REFLEX MICROSCOPIC
Hgb urine dipstick: NEGATIVE
Ketones, ur: NEGATIVE
Nitrite: NEGATIVE
RBC / HPF: NONE SEEN (ref 0–?)
Specific Gravity, Urine: <=1.005 (ref 1.000–1.030)
Urobilinogen, UA: 0.2 (ref 0.0–1.0)

## 2013-02-08 LAB — HEPATIC FUNCTION PANEL
ALT: 18 U/L (ref 0–35)
AST: 24 U/L (ref 0–37)
Albumin: 3.8 g/dL (ref 3.5–5.2)
Alkaline Phosphatase: 48 U/L (ref 39–117)
Total Protein: 6.3 g/dL (ref 6.0–8.3)

## 2013-02-22 ENCOUNTER — Ambulatory Visit (INDEPENDENT_AMBULATORY_CARE_PROVIDER_SITE_OTHER): Payer: Medicare Other | Admitting: Internal Medicine

## 2013-02-22 ENCOUNTER — Encounter: Payer: Self-pay | Admitting: Internal Medicine

## 2013-02-22 VITALS — BP 170/98 | HR 80 | Temp 99.0°F | Resp 16 | Wt 102.0 lb

## 2013-02-22 DIAGNOSIS — M24541 Contracture, right hand: Secondary | ICD-10-CM

## 2013-02-22 DIAGNOSIS — M545 Low back pain: Secondary | ICD-10-CM

## 2013-02-22 DIAGNOSIS — M6530 Trigger finger, unspecified finger: Secondary | ICD-10-CM

## 2013-02-22 DIAGNOSIS — Z23 Encounter for immunization: Secondary | ICD-10-CM

## 2013-02-22 DIAGNOSIS — M24549 Contracture, unspecified hand: Secondary | ICD-10-CM

## 2013-02-22 DIAGNOSIS — M810 Age-related osteoporosis without current pathological fracture: Secondary | ICD-10-CM

## 2013-02-22 NOTE — Assessment & Plan Note (Signed)
Doing well 

## 2013-02-22 NOTE — Assessment & Plan Note (Signed)
Worse Dr Katrinka Blazing

## 2013-02-22 NOTE — Progress Notes (Signed)
Patient ID: Joy Thompson, female   DOB: 04-Jul-1940, 72 y.o.   MRN: [id number]   Subjective:    HPI  The patient has been doing well overall without major physical or psychological issues going on lately.   BP is nl at home  BP Readings from Last 3 Encounters:  02/22/13 170/98  08/23/12 160/90  02/23/12 170/88   Wt Readings from Last 3 Encounters:  02/22/13 102 lb (46.267 kg)  08/23/12 98 lb (44.453 kg)  02/23/12 100 lb (45.36 kg)      Review of Systems  Constitutional: Negative.  Negative for fever, chills, diaphoresis, activity change, appetite change, fatigue and unexpected weight change.  HENT: Negative for congestion, ear pain, facial swelling, hearing loss, mouth sores, nosebleeds, postnasal drip, rhinorrhea, sinus pressure, sneezing, sore throat, tinnitus and trouble swallowing.   Eyes: Negative for pain, discharge, redness, itching and visual disturbance.  Respiratory: Negative for cough, chest tightness, shortness of breath, wheezing and stridor.   Cardiovascular: Negative for chest pain, palpitations and leg swelling.  Gastrointestinal: Negative for nausea, diarrhea, constipation, blood in stool, abdominal distention, anal bleeding and rectal pain.  Genitourinary: Negative for dysuria, urgency, frequency, hematuria, flank pain, vaginal bleeding, vaginal discharge, difficulty urinating, genital sores and pelvic pain.  Musculoskeletal: Negative for arthralgias, back pain, gait problem, joint swelling, neck pain and neck stiffness.  Skin: Negative.  Negative for rash.  Neurological: Negative for dizziness, tremors, seizures, syncope, speech difficulty, weakness, numbness and headaches.  Hematological: Negative for adenopathy. Does not bruise/bleed easily.  Psychiatric/Behavioral: Negative for suicidal ideas, behavioral problems, sleep disturbance, dysphoric mood and decreased concentration. The patient is not nervous/anxious.        Objective:   Physical Exam   Constitutional: She appears well-developed and well-nourished. No distress.  Thin, in NAD  HENT:  Head: Normocephalic.  Right Ear: External ear normal.  Left Ear: External ear normal.  Nose: Nose normal.  Mouth/Throat: Oropharynx is clear and moist.  Eyes: Conjunctivae are normal. Pupils are equal, round, and reactive to light. Right eye exhibits no discharge. Left eye exhibits no discharge.  Neck: Normal range of motion. Neck supple. No JVD present. No tracheal deviation present. No thyromegaly present.  Cardiovascular: Normal rate, regular rhythm and normal heart sounds.   Pulmonary/Chest: No stridor. No respiratory distress. She has no wheezes.  Abdominal: Soft. Bowel sounds are normal. She exhibits no distension and no mass. There is no tenderness. There is no rebound and no guarding.  Musculoskeletal: She exhibits no edema and no tenderness.  Lymphadenopathy:    She has no cervical adenopathy.  Neurological: She displays normal reflexes. No cranial nerve deficit. She exhibits normal muscle tone. Coordination normal.  Skin: No rash noted. No erythema.  Psychiatric: She has a normal mood and affect. Her behavior is normal. Judgment and thought content normal.        Lab Results  Component Value Date   WBC 5.3 02/08/2013   HGB 13.3 02/08/2013   HCT 39.5 02/08/2013   PLT 271.0 02/08/2013   CHOL 170 08/10/2012   TRIG 88.0 08/10/2012   HDL 68.70 08/10/2012   ALT 18 02/08/2013   AST 24 02/08/2013   NA 141 02/08/2013   K 4.2 02/08/2013   CL 104 02/08/2013   CREATININE 0.6 02/08/2013   BUN 6 02/08/2013   CO2 30 02/08/2013   TSH 3.78 02/08/2013     Assessment & Plan:

## 2013-02-22 NOTE — Assessment & Plan Note (Signed)
Continue with current prescription therapy as reflected on the Med list.  

## 2013-02-22 NOTE — Progress Notes (Signed)
Pre visit review using our clinic review tool, if applicable. No additional management support is needed unless otherwise documented below in the visit note. 

## 2013-02-22 NOTE — Assessment & Plan Note (Signed)
12/14 R [address] Katrinka Blazing

## 2013-02-26 ENCOUNTER — Other Ambulatory Visit (INDEPENDENT_AMBULATORY_CARE_PROVIDER_SITE_OTHER): Payer: 59

## 2013-02-26 ENCOUNTER — Ambulatory Visit (INDEPENDENT_AMBULATORY_CARE_PROVIDER_SITE_OTHER): Payer: Medicare Other | Admitting: Family Medicine

## 2013-02-26 ENCOUNTER — Encounter: Payer: Self-pay | Admitting: Family Medicine

## 2013-02-26 VITALS — BP 136/84 | HR 81 | Wt 104.0 lb

## 2013-02-26 DIAGNOSIS — M79609 Pain in unspecified limb: Secondary | ICD-10-CM

## 2013-02-26 DIAGNOSIS — M79641 Pain in right hand: Secondary | ICD-10-CM

## 2013-02-26 DIAGNOSIS — M24541 Contracture, right hand: Secondary | ICD-10-CM

## 2013-02-26 DIAGNOSIS — M24549 Contracture, unspecified hand: Secondary | ICD-10-CM

## 2013-02-26 NOTE — Progress Notes (Signed)
Pre-visit discussion using our clinic review tool. No additional management support is needed unless otherwise documented below in the visit note.  

## 2013-02-26 NOTE — Assessment & Plan Note (Signed)
Patient does have a contracture of the lateral aspect of her pinky finger. I unfortunately do think this is a lateral slip. With patient being in a mallet finger deformity of the PIP. If patient does not improve in the next 2 weeks with this we will consider evaluation by a hand surgeon. Patient will follow up again in 2 weeks. Patient was also given home exercise program.

## 2013-02-26 NOTE — Patient Instructions (Signed)
Good to meet you both.  Do exercises daily.  Ice 20 minutes can help.  I want to see you again in 2 weeks.

## 2013-02-26 NOTE — Progress Notes (Signed)
  I'm seeing this patient by the request  of:  Sonda Primes, MD  CC: right trigger finger.   HPI: Patient is a very pleasant 72 year old female who is coming in with an acquired trigger finger for multiple months possibly years. Patient does not remember any specific injury. Patient states that it seems to be harder and harder to extend her right little finger. Patient denies any pain or any numbness in this area. Patient was sent here for evaluation to see if there is anything else that can be done. Patient does state from time to time she does catch it when she is wearing gloves or reaching for something from time to time but no significant affect on her daily activities. Patient has not tried anything at home for this at the time. Patient states that the frustration is approximately 3/10 in severity.   Past medical, surgical, family and social history reviewed. Medications reviewed all in the electronic medical record.   Review of Systems: No headache, visual changes, nausea, vomiting, diarrhea, constipation, dizziness, abdominal pain, skin rash, fevers, chills, night sweats, weight loss, swollen lymph nodes, body aches, joint swelling, muscle aches, chest pain, shortness of breath, mood changes.   Objective:    Blood pressure 136/84, pulse 81, weight 104 lb (47.174 kg), SpO2 98.00%.   General: No apparent distress alert and oriented x3 mood and affect normal, dressed appropriately.  HEENT: Pupils equal, extraocular movements intact Respiratory: Patient's speak in full sentences and does not appear short of breath Cardiovascular: No lower extremity edema, non tender, no erythema Skin: Warm dry intact with no signs of infection or rash on extremities or on axial skeleton. Abdomen: Soft nontender Neuro: Cranial nerves II through XII are intact, neurovascularly intact in all extremities with 2+ DTRs and 2+ pulses. Lymph: No lymphadenopathy of posterior or anterior cervical chain or axillae  bilaterally.  Gait normal with good balance and coordination.  MSK: Non tender with full range of motion and good stability and symmetric strength and tone of shoulders, elbows, wrist, hip, knee and ankles bilaterally.  HEENT exam shows the patient does have a nodule on the right pinky finger at the PIP on the ulnar aspect. This is mildly laterally displaced. This is fairly firm to touch. Patient can have her finger extended all the way if necessary. Patient cannot do this actively though. Nontender on exam and neurovascularly intact distally.  Limited muscular skeletal ultrasound was performed and interpreted by Antoine Primas, M Limited ultrasound of the right finger shows the patient does have a nodule that appears to be more of the lateral ligament. There is no disruption of the lateral plate the volar plate noted. This appears to be a lateral slip.  Impression: Chronic lateral slip  After verbal consent patient was prepped with alcohol swabs and with a 26-gauge half-inch needle was injected with 0.5 cc of 0.5% Marcaine and 0.5 cc of Kenalog 40 mg/dL. Patient tolerated the procedure well with no blood loss. Patient was prepped with a Band-Aid. Post injection instructions given patient did have some mild increased extension of the finger immediately.  Impression and Recommendations:     This case required medical decision making of moderate complexity.

## 2013-02-28 ENCOUNTER — Ambulatory Visit: Payer: 59 | Admitting: Family Medicine

## 2013-03-12 ENCOUNTER — Encounter: Payer: Self-pay | Admitting: Family Medicine

## 2013-03-12 ENCOUNTER — Ambulatory Visit (INDEPENDENT_AMBULATORY_CARE_PROVIDER_SITE_OTHER): Payer: 59 | Admitting: Family Medicine

## 2013-03-12 VITALS — BP 142/88 | HR 79

## 2013-03-12 DIAGNOSIS — M999 Biomechanical lesion, unspecified: Secondary | ICD-10-CM | POA: Insufficient documentation

## 2013-03-12 DIAGNOSIS — M6530 Trigger finger, unspecified finger: Secondary | ICD-10-CM

## 2013-03-12 DIAGNOSIS — M533 Sacrococcygeal disorders, not elsewhere classified: Secondary | ICD-10-CM

## 2013-03-12 NOTE — Assessment & Plan Note (Signed)
Patient did have injection at last visit and has made some improvement. I would like patient to continue with the exercises and I will see her again in 3 weeks. If she continues to have contracture of this finger we will do another injection. Patient is going to start doing splinting at night which could be also beneficial. Once again see patient in 3 weeks

## 2013-03-12 NOTE — Assessment & Plan Note (Signed)
Decision today to treat with OMT was based on Physical Exam  After verbal consent patient was treated with ME techniques in sacral areas  Patient tolerated the procedure well with improvement in symptoms  Patient given exercises, stretches and lifestyle modifications  See medications in patient instructions if given  Patient will follow up in 3 weeks

## 2013-03-12 NOTE — Progress Notes (Signed)
CC: right trigger finger followup   HPI: Patient is following up for an acquired trigger finger. Patient did have an injection at last visit 3 weeks ago. Patient has been doing some home exercises on a regular basis. There was a concern for a lateral slip. Patient states that she is approximately 80% better. Patient has been doing the exercises on a regular basis. Patient denies any swelling or any numbness. Overall patient is doing significantly better.  Patient though is having a new problem of low back pain after getting out of car 3 days ago. Patient does not range of injury but is having right-sided very constant back pain. Patient rates is about 6/10. Denies any radiation of the leg any numbness. Patient states it hurts more with rotation or with palpation over the right SI joint. There had pain like this before. States that it seems to begin minorly better.  Past medical, surgical, family and social history reviewed. Medications reviewed all in the electronic medical record.   Review of Systems: No headache, visual changes, nausea, vomiting, diarrhea, constipation, dizziness, abdominal pain, skin rash, fevers, chills, night sweats, weight loss, swollen lymph nodes, body aches, joint swelling, muscle aches, chest pain, shortness of breath, mood changes.   Objective:    Blood pressure 142/88, pulse 79, SpO2 92.00%.   General: No apparent distress alert and oriented x3 mood and affect normal, dressed appropriately.  HEENT: Pupils equal, extraocular movements intact Respiratory: Patient's speak in full sentences and does not appear short of breath Cardiovascular: No lower extremity edema, non tender, no erythema Skin: Warm dry intact with no signs of infection or rash on extremities or on axial skeleton. Abdomen: Soft nontender Neuro: Cranial nerves II through XII are intact, neurovascularly intact in all extremities with 2+ DTRs and 2+ pulses. Lymph: No lymphadenopathy of posterior or  anterior cervical chain or axillae bilaterally.  Gait normal with good balance and coordination.  MSK: Non tender with full range of motion and good stability and symmetric strength and tone of shoulders, elbows, wrist, hip, knee and ankles bilaterally.  HEENT exam shows the patient does have a nodule on the right pinky finger at the PIP on the ulnar aspect. This is significantly decreased in size from prior exam. This is mildly laterally displaced. This is fairly firm to touch. Patient can have her finger extended all the way if necessary. Patient has improvement in extension actively. Nontender on exam and neurovascularly intact distally. Back exam shows the patient does have positive Pearlean Brownie test on the right side. She is tender to palpation over the right SI joint. Negative straight leg test and neurovascularly intact of the lower extremities with 2+ DTRs symmetric.  Osteopathic findings sacrum right a right    Impression and Recommendations:     This case required medical decision making of moderate complexity.

## 2013-03-12 NOTE — Assessment & Plan Note (Signed)
Patient did have osteopathic manipulation a day and did do very well. Home exercise program was given. Discussed icing protocol. Patient come back again in 3 weeks for further evaluation. Patient had near complete resolution of pain after manipulation.

## 2013-03-12 NOTE — Patient Instructions (Signed)
Good to see you Keep it up your exercises Try splinting it at night Come back again in 3 weeks and we may do another injection.   Sacroiliac Joint Mobilization and Rehab 1. Work on pretzel stretching, shoulder back and leg draped in front. 3-5 sets, 30 sec.. 2. hip abductor rotations. standing, hip flexion and rotation outward then inward. 3 sets, 15 reps. when can do comfortably, add ankle weights starting at 2 pounds.  3. cross over stretching - shoulder back to ground, same side leg crossover. 3-5 sets for 30 min..  4. rolling up and back knees to chest and rocking. 5. sacral tilt - 5 sets, hold for 5-10 seconds

## 2013-03-12 NOTE — Progress Notes (Signed)
Pre-visit discussion using our clinic review tool. No additional management support is needed unless otherwise documented below in the visit note.  

## 2013-04-09 ENCOUNTER — Ambulatory Visit (INDEPENDENT_AMBULATORY_CARE_PROVIDER_SITE_OTHER): Payer: 59 | Admitting: Family Medicine

## 2013-04-09 ENCOUNTER — Encounter: Payer: Self-pay | Admitting: Family Medicine

## 2013-04-09 VITALS — BP 110/62 | HR 81 | Temp 98.3°F | Resp 16 | Wt 101.1 lb

## 2013-04-09 DIAGNOSIS — M6530 Trigger finger, unspecified finger: Secondary | ICD-10-CM

## 2013-04-09 NOTE — Assessment & Plan Note (Signed)
Continues to improve at this time. Patient given a new brace to wear at night. Patient will try this intervention as well as massage for the next 4 weeks. Patient returns having any more of the nodule I would like to do an injection.

## 2013-04-09 NOTE — Progress Notes (Signed)
CC: right trigger finger followup   HPI: Patient is following up for an acquired trigger finger. Patient did have an injection at last visit 6 weeks ago. Patient continues to improve. Patient states that the bump is still there and his triggering that the pain is much better. Patient denies any numbness and states that she is having more increase strength. Patient is happy with the results so far. Patient is splinting at night. Patient would like some mild more increased range of motion.   Past medical, surgical, family and social history reviewed. Medications reviewed all in the electronic medical record.   Review of Systems: No headache, visual changes, nausea, vomiting, diarrhea, constipation, dizziness, abdominal pain, skin rash, fevers, chills, night sweats, weight loss, swollen lymph nodes, body aches, joint swelling, muscle aches, chest pain, shortness of breath, mood changes.   Objective:    Blood pressure 110/62, pulse 81, temperature 98.3 F (36.8 C), temperature source Oral, resp. rate 16, weight 101 lb 1.9 oz (45.868 kg), SpO2 94.00%.   General: No apparent distress alert and oriented x3 mood and affect normal, dressed appropriately.  HEENT: Pupils equal, extraocular movements intact Respiratory: Patient's speak in full sentences and does not appear short of breath Cardiovascular: No lower extremity edema, non tender, no erythema Skin: Warm dry intact with no signs of infection or rash on extremities or on axial skeleton. Abdomen: Soft nontender Neuro: Cranial nerves II through XII are intact, neurovascularly intact in all extremities with 2+ DTRs and 2+ pulses. Lymph: No lymphadenopathy of posterior or anterior cervical chain or axillae bilaterally.  Gait normal with good balance and coordination.  MSK: Non tender with full range of motion and good stability and symmetric strength and tone of shoulders, elbows, wrist, hip, knee and ankles bilaterally.  Hand exam shows the patient  does have a nodule on the right pinky finger at the PIP on the ulnar aspect. -sized nodule is approximately the same as previous exam.  This is mildly laterally displaced. This is fairly firm to touch. Patient can have her finger extended all the way if necessary. Patient has improvement in extension actively.   Impression and Recommendations:     This case required medical decision making of moderate complexity.

## 2013-04-09 NOTE — Patient Instructions (Signed)
Very good to see you Continue your exercsies 3 times a  week.  Wear the brace at night.  Lets have you come back in 3 weeks.  If not better we will DEFINITELY do the injection.

## 2013-04-09 NOTE — Progress Notes (Signed)
Pre-visit discussion using our clinic review tool. No additional management support is needed unless otherwise documented below in the visit note.  

## 2013-04-30 ENCOUNTER — Ambulatory Visit: Payer: 59 | Admitting: Family Medicine

## 2013-05-01 ENCOUNTER — Ambulatory Visit (INDEPENDENT_AMBULATORY_CARE_PROVIDER_SITE_OTHER): Payer: 59 | Admitting: Family Medicine

## 2013-05-01 ENCOUNTER — Encounter: Payer: Self-pay | Admitting: Family Medicine

## 2013-05-01 VITALS — BP 116/64 | HR 78 | Temp 98.7°F | Resp 16 | Wt 101.2 lb

## 2013-05-01 DIAGNOSIS — M6530 Trigger finger, unspecified finger: Secondary | ICD-10-CM

## 2013-05-01 NOTE — Progress Notes (Signed)
Pre-visit discussion using our clinic review tool. No additional management support is needed unless otherwise documented below in the visit note.  

## 2013-05-01 NOTE — Progress Notes (Signed)
CC: right trigger finger followup   HPI: Patient is following up for an acquired trigger finger. Patient did have an injection at last visit 10 weeks ago. Patient states that the bump still remains but does have some mild triggering of the finger. Patient like to be able to extend her finger fully. Patient is not completely satisfied that is significantly better than she was prior to intervention. She continues to do the exercises fairly well.   Past medical, surgical, family and social history reviewed. Medications reviewed all in the electronic medical record.   Review of Systems: No headache, visual changes, nausea, vomiting, diarrhea, constipation, dizziness, abdominal pain, skin rash, fevers, chills, night sweats, weight loss, swollen lymph nodes, body aches, joint swelling, muscle aches, chest pain, shortness of breath, mood changes.   Objective:    Blood pressure 116/64, pulse 78, temperature 98.7 F (37.1 C), temperature source Oral, resp. rate 16, weight 101 lb 3.2 oz (45.904 kg), SpO2 98.00%.   General: No apparent distress alert and oriented x3 mood and affect normal, dressed appropriately.  HEENT: Pupils equal, extraocular movements intact Respiratory: Patient's speak in full sentences and does not appear short of breath Cardiovascular: No lower extremity edema, non tender, no erythema Skin: Warm dry intact with no signs of infection or rash on extremities or on axial skeleton. Abdomen: Soft nontender Neuro: Cranial nerves II through XII are intact, neurovascularly intact in all extremities with 2+ DTRs and 2+ pulses. Lymph: No lymphadenopathy of posterior or anterior cervical chain or axillae bilaterally.  Gait normal with good balance and coordination.  MSK: Non tender with full range of motion and good stability and symmetric strength and tone of shoulders, elbows, wrist, hip, knee and ankles bilaterally.  Hand exam shows the patient does have a nodule on the right pinky finger  at the PIP on the ulnar aspect. -sized nodule is approximately the same as previous exam. Patient does still lacking the last 10 of extension of the PIP joint the  This is mildly laterally displaced. This is fairly firm to touch. Patient can have her finger extended all the way if necessary.   After verbal consent patient was prepped with alcohol swabs and with a 25-gauge 1 inch needle patient was injected with 0.5 cc of 0.5% Marcaine as well as 0.5 cc of 40 mg/dL Kenalog. Patient tolerated the procedure well and did have some mild manipulation to allow for further extension and it was put into brace. Post injection instructions given.     Impression and Recommendations:     This case required medical decision making of moderate complexity. Spent greater than 25 minutes with patient face-to-face and had greater than 50% of counseling including as described above in assessment and plan.

## 2013-05-01 NOTE — Assessment & Plan Note (Signed)
Patient did have another injection today. We will have her wear the brace for the next 48 hours and then where the extension brace at night for another week. Patient will come back and see me again in 3 weeks for further evaluation. Discussed icing and continue the exercises on a fairly regular basis.

## 2013-05-01 NOTE — Patient Instructions (Signed)
Good to see you Wear brace next 48 hours.  Then wear brace only at night for next week Continue exercises starting in 2 days Come back again in 3 weeks.

## 2013-05-14 ENCOUNTER — Encounter: Payer: Self-pay | Admitting: Internal Medicine

## 2013-05-14 ENCOUNTER — Ambulatory Visit (INDEPENDENT_AMBULATORY_CARE_PROVIDER_SITE_OTHER): Payer: 59 | Admitting: Internal Medicine

## 2013-05-14 ENCOUNTER — Ambulatory Visit (INDEPENDENT_AMBULATORY_CARE_PROVIDER_SITE_OTHER)
Admission: RE | Admit: 2013-05-14 | Discharge: 2013-05-14 | Disposition: A | Discharge status: Home / Self Care | Payer: 59 | Source: Ambulatory Visit | Attending: Internal Medicine | Admitting: Internal Medicine

## 2013-05-14 VITALS — BP 130/88 | HR 88 | Temp 98.1°F | Resp 16 | Ht 62.0 in | Wt 99.5 lb

## 2013-05-14 DIAGNOSIS — R059 Cough, unspecified: Secondary | ICD-10-CM | POA: Insufficient documentation

## 2013-05-14 DIAGNOSIS — J209 Acute bronchitis, unspecified: Secondary | ICD-10-CM

## 2013-05-14 DIAGNOSIS — R05 Cough: Secondary | ICD-10-CM

## 2013-05-14 DIAGNOSIS — J189 Pneumonia, unspecified organism: Secondary | ICD-10-CM

## 2013-05-14 DIAGNOSIS — H6990 Unspecified Eustachian tube disorder, unspecified ear: Secondary | ICD-10-CM

## 2013-05-14 HISTORY — DX: Pneumonia, unspecified organism: J18.9

## 2013-05-14 MED ORDER — METHYLPREDNISOLONE 4 MG PO KIT
PACK | ORAL | Status: DC
Start: 2013-05-14 — End: 2013-08-23

## 2013-05-14 MED ORDER — PROMETHAZINE-DM 6.25-15 MG/5ML PO SYRP: 5.0000 mL | ORAL_SOLUTION | Freq: Four times a day (QID) | ORAL | Status: DC | PRN

## 2013-05-14 MED ORDER — CEFUROXIME AXETIL 500 MG PO TABS: 500.0000 mg | ORAL_TABLET | Freq: Two times a day (BID) | ORAL | Status: DC

## 2013-05-14 NOTE — Patient Instructions (Signed)
Acute Bronchitis Bronchitis is inflammation of the airways that extend from the windpipe into the lungs (bronchi). The inflammation often causes mucus to develop. This leads to a cough, which is the most common symptom of bronchitis.  In acute bronchitis, the condition usually develops suddenly and goes away over time, usually in a couple weeks. Smoking, allergies, and asthma can make bronchitis worse. Repeated episodes of bronchitis may cause further lung problems.  CAUSES Acute bronchitis is most often caused by the same virus that causes a cold. The virus can spread from person to person (contagious).  SIGNS AND SYMPTOMS   Cough.   Fever.   Coughing up mucus.   Body aches.   Chest congestion.   Chills.   Shortness of breath.   Sore throat.  DIAGNOSIS  Acute bronchitis is usually diagnosed through a physical exam. Tests, such as chest X-rays, are sometimes done to rule out other conditions.  TREATMENT  Acute bronchitis usually goes away in a couple weeks. Often times, no medical treatment is necessary. Medicines are sometimes given for relief of fever or cough. Antibiotics are usually not needed but may be prescribed in certain situations. In some cases, an inhaler may be recommended to help reduce shortness of breath and control the cough. A cool mist vaporizer may also be used to help thin bronchial secretions and make it easier to clear the chest.  HOME CARE INSTRUCTIONS  Get plenty of rest.   Drink enough fluids to keep your urine clear or pale yellow (unless you have a medical condition that requires fluid restriction). Increasing fluids may help thin your secretions and will prevent dehydration.   Only take over-the-counter or prescription medicines as directed by your health care provider.   Avoid smoking and secondhand smoke. Exposure to cigarette smoke or irritating chemicals will make bronchitis worse. If you are a smoker, consider using nicotine gum or skin  patches to help control withdrawal symptoms. Quitting smoking will help your lungs heal faster.   Reduce the chances of another bout of acute bronchitis by washing your hands frequently, avoiding people with cold symptoms, and trying not to touch your hands to your mouth, nose, or eyes.   Follow up with your health care provider as directed.  SEEK MEDICAL CARE IF: Your symptoms do not improve after 1 week of treatment.  SEEK IMMEDIATE MEDICAL CARE IF:  You develop an increased fever or chills.   You have chest pain.   You have severe shortness of breath.  You have bloody sputum.   You develop dehydration.  You develop fainting.  You develop repeated vomiting.  You develop a severe headache. MAKE SURE YOU:   Understand these instructions.  Will watch your condition.  Will get help right away if you are not doing well or get worse. Document Released: 04/15/2004 Document Revised: 11/08/2012 Document Reviewed: 08/29/2012 [employer] Patient Information 2014 [employer]  

## 2013-05-14 NOTE — Progress Notes (Signed)
Pre visit review using our clinic review tool, if applicable. No additional management support is needed unless otherwise documented below in the visit note. 

## 2013-05-15 NOTE — Assessment & Plan Note (Signed)
CXR shows a nodule She will take ceftin and will recheck in 2-3 weeks Will consider getting a CT done depending on the response to ceftin

## 2013-05-15 NOTE — Progress Notes (Signed)
Subjective:    Patient ID: Joy Thompson, female    DOB: [birth year], 73 y.o.   MRN: [id number]  Cough This is a recurrent problem. Episode onset: for 2 weeks. The problem has been gradually worsening. The cough is productive of purulent sputum. Associated symptoms include chills, ear congestion, ear pain ("popping and pain in both ears"), nasal congestion, postnasal drip, rhinorrhea and sweats. Pertinent negatives include no chest pain, fever, headaches, heartburn, hemoptysis, myalgias, rash, sore throat, shortness of breath, weight loss or wheezing. Nothing aggravates the symptoms. She has tried OTC cough suppressant for the symptoms. The treatment provided no relief. Her past medical history is significant for bronchitis. There is no history of asthma, bronchiectasis, COPD, emphysema, environmental allergies or pneumonia.      Review of Systems  Constitutional: Positive for chills. Negative for fever, weight loss, diaphoresis, activity change, appetite change, fatigue and unexpected weight change.  HENT: Positive for ear pain ("popping and pain in both ears"), postnasal drip and rhinorrhea. Negative for facial swelling, nosebleeds, sinus pressure, sore throat and trouble swallowing.   Eyes: Negative.   Respiratory: Positive for cough. Negative for apnea, hemoptysis, choking, chest tightness, shortness of breath and wheezing.   Cardiovascular: Negative.  Negative for chest pain, palpitations and leg swelling.  Gastrointestinal: Negative.  Negative for heartburn, nausea, vomiting, abdominal pain, diarrhea and constipation.  Endocrine: Negative.   Genitourinary: Negative.   Musculoskeletal: Negative.  Negative for myalgias.  Skin: Negative.  Negative for rash.  Allergic/Immunologic: Negative.  Negative for environmental allergies.  Neurological: Negative.  Negative for headaches.  Hematological: Negative.  Negative for adenopathy. Does not bruise/bleed easily.  Psychiatric/Behavioral:  Negative.        Objective:   Physical Exam  Vitals reviewed. Constitutional: She is oriented to person, place, and time. She appears well-developed and well-nourished.  Non-toxic appearance. She does not have a sickly appearance. She does not appear ill. No distress.  HENT:  Right Ear: Hearing, tympanic membrane, external ear and ear canal normal.  Left Ear: Hearing, tympanic membrane, external ear and ear canal normal.  Nose: Mucosal edema and rhinorrhea present. No nose lacerations, sinus tenderness, nasal deformity, septal deviation or nasal septal hematoma. No epistaxis.  No foreign bodies. Right sinus exhibits no maxillary sinus tenderness and no frontal sinus tenderness. Left sinus exhibits no maxillary sinus tenderness and no frontal sinus tenderness.  Mouth/Throat: Oropharynx is clear and moist and mucous membranes are normal. Mucous membranes are not pale, not dry and not cyanotic. No oral lesions. No trismus in the jaw. No uvula swelling. No oropharyngeal exudate, posterior oropharyngeal edema, posterior oropharyngeal erythema or tonsillar abscesses.  Eyes: Conjunctivae are normal. Right eye exhibits no discharge. Left eye exhibits no discharge. No scleral icterus.  Neck: Normal range of motion. Neck supple. No JVD present. No tracheal deviation present. No thyromegaly present.  Cardiovascular: Normal rate, regular rhythm, normal heart sounds and intact distal pulses.  Exam reveals no gallop and no friction rub.   No murmur heard. Pulmonary/Chest: Effort normal. No accessory muscle usage or stridor. Not tachypneic. No respiratory distress. She has no decreased breath sounds. She has no wheezes. She has no rhonchi. She has rales in the right lower field and the left lower field.  There are soft dry rales in both bases  Abdominal: Soft. Bowel sounds are normal. She exhibits no distension and no mass. There is no tenderness. There is no rebound and no guarding.  Musculoskeletal: Normal  range of motion. She exhibits no  edema and no tenderness.  Lymphadenopathy:    She has no cervical adenopathy.  Neurological: She is oriented to person, place, and time.  Skin: Skin is warm and dry. No rash noted. She is not diaphoretic. No erythema. No pallor.          Assessment & Plan:

## 2013-05-15 NOTE — Assessment & Plan Note (Signed)
Her CXR shows a nodule that could infectious so I have asked to take ceftin She will try phen-dm for the cough

## 2013-05-15 NOTE — Assessment & Plan Note (Signed)
She has a lot of symptoms related to this so I have asked her to take a medrol dose pak

## 2013-05-22 ENCOUNTER — Encounter: Payer: Self-pay | Admitting: Family Medicine

## 2013-05-22 ENCOUNTER — Ambulatory Visit (INDEPENDENT_AMBULATORY_CARE_PROVIDER_SITE_OTHER): Payer: 59 | Admitting: Family Medicine

## 2013-05-22 VITALS — BP 110/66 | HR 79 | Temp 97.8°F | Resp 12 | Wt 101.1 lb

## 2013-05-22 DIAGNOSIS — M6530 Trigger finger, unspecified finger: Secondary | ICD-10-CM

## 2013-05-22 NOTE — Progress Notes (Signed)
CC: right trigger finger followup   HPI: Patient is following up for an acquired trigger finger. Patient did have another injection at last followup. Patient states that she did get another bout 25% better. Patient states that she is able to wear a glove without any discomfort and is able to get her hand in and out of her pocket without any catching. Overall she is happy with results and is not having any pain. Patient also can move it a significant amount more which she is excited about. No new symptoms.  Past medical, surgical, family and social history reviewed. Medications reviewed all in the electronic medical record.   Review of Systems: No headache, visual changes, nausea, vomiting, diarrhea, constipation, dizziness, abdominal pain, skin rash, fevers, chills, night sweats, weight loss, swollen lymph nodes, body aches, joint swelling, muscle aches, chest pain, shortness of breath, mood changes.   Objective:    Blood pressure 110/66, pulse 79, temperature 97.8 F (36.6 C), temperature source Oral, resp. rate 12, weight 101 lb 1.3 oz (45.85 kg), SpO2 98.00%.   General: No apparent distress alert and oriented x3 mood and affect normal, dressed appropriately.  HEENT: Pupils equal, extraocular movements intact Respiratory: Patient's speak in full sentences and does not appear short of breath Cardiovascular: No lower extremity edema, non tender, no erythema Skin: Warm dry intact with no signs of infection or rash on extremities or on axial skeleton. Abdomen: Soft nontender Neuro: Cranial nerves II through XII are intact, neurovascularly intact in all extremities with 2+ DTRs and 2+ pulses. Lymph: No lymphadenopathy of posterior or anterior cervical chain or axillae bilaterally.  Gait normal with good balance and coordination.  MSK: Non tender with full range of motion and good stability and symmetric strength and tone of shoulders, elbows, wrist, hip, knee and ankles bilaterally.  Hand exam  shows the patient does have a nodule on the right pinky finger at the PIP on the ulnar aspect.nodule has decreased in size again. Patient does still lacking the last 8 of extension of the PIP joint.. Patient can have her finger extended all the way if necessary.      Impression and Recommendations:

## 2013-05-22 NOTE — Assessment & Plan Note (Signed)
Patient is happy with the results now been another 3 weeks out from last injection. Patient will no longer wear the splint but has any discomfort she'll go back to be conservative approach with a home exercises and splinting at night. Patient can followup on an as-needed basis.

## 2013-05-22 NOTE — Progress Notes (Signed)
Pre visit review using our clinic review tool, if applicable. No additional management support is needed unless otherwise documented below in the visit note. 

## 2013-05-22 NOTE — Patient Instructions (Signed)
Good to see you You are doing great Come back when you need me.  Verbal instructions given.

## 2013-05-24 ENCOUNTER — Encounter: Payer: Self-pay | Admitting: Internal Medicine

## 2013-05-24 ENCOUNTER — Ambulatory Visit (INDEPENDENT_AMBULATORY_CARE_PROVIDER_SITE_OTHER): Payer: 59 | Admitting: Internal Medicine

## 2013-05-24 VITALS — BP 148/82 | HR 80 | Temp 98.5°F | Resp 16 | Wt 102.0 lb

## 2013-05-24 DIAGNOSIS — R918 Other nonspecific abnormal finding of lung field: Secondary | ICD-10-CM

## 2013-05-24 DIAGNOSIS — J209 Acute bronchitis, unspecified: Secondary | ICD-10-CM

## 2013-05-24 DIAGNOSIS — R9389 Abnormal findings on diagnostic imaging of other specified body structures: Secondary | ICD-10-CM

## 2013-05-24 DIAGNOSIS — Z0000 Encounter for general adult medical examination without abnormal findings: Secondary | ICD-10-CM

## 2013-05-24 NOTE — Progress Notes (Signed)
   Subjective:    HPI  F/u URI - better and abn CXR The patient has been doing well overall without major physical or psychological issues going on lately.   BP is nl at home  BP Readings from Last 3 Encounters:  05/24/13 148/82  05/22/13 110/66  05/14/13 130/88   Wt Readings from Last 3 Encounters:  05/24/13 102 lb (46.267 kg)  05/22/13 101 lb 1.3 oz (45.85 kg)  05/14/13 99 lb 8 oz (45.133 kg)      Review of Systems  Constitutional: Negative.  Negative for fever, chills, diaphoresis, activity change, appetite change, fatigue and unexpected weight change.  HENT: Negative for congestion, ear pain, facial swelling, hearing loss, mouth sores, nosebleeds, postnasal drip, rhinorrhea, sinus pressure, sneezing, sore throat, tinnitus and trouble swallowing.   Eyes: Negative for pain, discharge, redness, itching and visual disturbance.  Respiratory: Negative for cough, chest tightness, shortness of breath, wheezing and stridor.   Cardiovascular: Negative for chest pain, palpitations and leg swelling.  Gastrointestinal: Negative for nausea, diarrhea, constipation, blood in stool, abdominal distention, anal bleeding and rectal pain.  Genitourinary: Negative for dysuria, urgency, frequency, hematuria, flank pain, vaginal bleeding, vaginal discharge, difficulty urinating, genital sores and pelvic pain.  Musculoskeletal: Negative for arthralgias, back pain, gait problem, joint swelling, neck pain and neck stiffness.  Skin: Negative.  Negative for rash.  Neurological: Negative for dizziness, tremors, seizures, syncope, speech difficulty, weakness, numbness and headaches.  Hematological: Negative for adenopathy. Does not bruise/bleed easily.  Psychiatric/Behavioral: Negative for suicidal ideas, behavioral problems, sleep disturbance, dysphoric mood and decreased concentration. The patient is not nervous/anxious.        Objective:   Physical Exam  Constitutional: She appears well-developed  and well-nourished. No distress.  Thin, in NAD  HENT:  Head: Normocephalic.  Right Ear: External ear normal.  Left Ear: External ear normal.  Nose: Nose normal.  Mouth/Throat: Oropharynx is clear and moist.  Eyes: Conjunctivae are normal. Pupils are equal, round, and reactive to light. Right eye exhibits no discharge. Left eye exhibits no discharge.  Neck: Normal range of motion. Neck supple. No JVD present. No tracheal deviation present. No thyromegaly present.  Cardiovascular: Normal rate, regular rhythm and normal heart sounds.   Pulmonary/Chest: No stridor. No respiratory distress. She has no wheezes.  Abdominal: Soft. Bowel sounds are normal. She exhibits no distension and no mass. There is no tenderness. There is no rebound and no guarding.  Musculoskeletal: She exhibits no edema and no tenderness.  Lymphadenopathy:    She has no cervical adenopathy.  Neurological: She displays normal reflexes. No cranial nerve deficit. She exhibits normal muscle tone. Coordination normal.  Skin: No rash noted. No erythema.  Psychiatric: She has a normal mood and affect. Her behavior is normal. Judgment and thought content normal.  LLL - rhonchi  CXR     Lab Results  Component Value Date   WBC 5.3 02/08/2013   HGB 13.3 02/08/2013   HCT 39.5 02/08/2013   PLT 271.0 02/08/2013   CHOL 170 08/10/2012   TRIG 88.0 08/10/2012   HDL 68.70 08/10/2012   ALT 18 02/08/2013   AST 24 02/08/2013   NA 141 02/08/2013   K 4.2 02/08/2013   CL 104 02/08/2013   CREATININE 0.6 02/08/2013   BUN 6 02/08/2013   CO2 30 02/08/2013   TSH 3.78 02/08/2013     Assessment & Plan:

## 2013-05-24 NOTE — Assessment & Plan Note (Signed)
05/14/12 CXR IMPRESSION:  Nodular density within the right lung base differential  considerations are confluence of normal structures, atelectasis or  possibly possibly an infiltrate, a pulmonary nodule cannot be  excluded. Considering the patient's history repeat surveillance  evaluation status post appropriate therapeutic regimen is  recommended.  Electronically Signed  By: Margaree Mackintosh M.D.  On: 05/14/2013 16:06  CXR in 6 wks

## 2013-05-24 NOTE — Assessment & Plan Note (Signed)
Better  Finish Rx 

## 2013-05-24 NOTE — Progress Notes (Signed)
Pre visit review using our clinic review tool, if applicable. No additional management support is needed unless otherwise documented below in the visit note. 

## 2013-06-11 ENCOUNTER — Ambulatory Visit (INDEPENDENT_AMBULATORY_CARE_PROVIDER_SITE_OTHER)
Admission: RE | Admit: 2013-06-11 | Discharge: 2013-06-11 | Disposition: A | Discharge status: Home / Self Care | Payer: 59 | Source: Ambulatory Visit | Attending: Internal Medicine | Admitting: Internal Medicine

## 2013-06-11 ENCOUNTER — Other Ambulatory Visit: Payer: Self-pay | Admitting: Internal Medicine

## 2013-06-11 ENCOUNTER — Other Ambulatory Visit (INDEPENDENT_AMBULATORY_CARE_PROVIDER_SITE_OTHER): Payer: Medicare Other

## 2013-06-11 DIAGNOSIS — N390 Urinary tract infection, site not specified: Secondary | ICD-10-CM

## 2013-06-11 DIAGNOSIS — R918 Other nonspecific abnormal finding of lung field: Secondary | ICD-10-CM

## 2013-06-11 DIAGNOSIS — R9389 Abnormal findings on diagnostic imaging of other specified body structures: Secondary | ICD-10-CM

## 2013-06-11 DIAGNOSIS — Z0000 Encounter for general adult medical examination without abnormal findings: Secondary | ICD-10-CM

## 2013-06-11 DIAGNOSIS — E785 Hyperlipidemia, unspecified: Secondary | ICD-10-CM

## 2013-06-11 DIAGNOSIS — J209 Acute bronchitis, unspecified: Secondary | ICD-10-CM

## 2013-06-11 HISTORY — DX: Urinary tract infection, site not specified: N39.0

## 2013-06-11 LAB — URINALYSIS, ROUTINE W REFLEX MICROSCOPIC
Bilirubin Urine: NEGATIVE
HGB URINE DIPSTICK: NEGATIVE
Ketones, ur: NEGATIVE
Nitrite: POSITIVE — AB
Specific Gravity, Urine: <=1.005 — AB (ref 1.000–1.030)
Total Protein, Urine: NEGATIVE
UROBILINOGEN UA: 0.2 (ref 0.0–1.0)
Urine Glucose: NEGATIVE
pH: 6 (ref 5.0–8.0)

## 2013-06-11 MED ORDER — CIPROFLOXACIN HCL 250 MG PO TABS: 250.0000 mg | ORAL_TABLET | Freq: Two times a day (BID) | ORAL | Status: DC

## 2013-08-07 ENCOUNTER — Telehealth: Payer: Self-pay | Admitting: Internal Medicine

## 2013-08-07 ENCOUNTER — Other Ambulatory Visit (INDEPENDENT_AMBULATORY_CARE_PROVIDER_SITE_OTHER): Payer: Medicare Other

## 2013-08-07 DIAGNOSIS — R7989 Other specified abnormal findings of blood chemistry: Secondary | ICD-10-CM

## 2013-08-07 DIAGNOSIS — E785 Hyperlipidemia, unspecified: Secondary | ICD-10-CM

## 2013-08-07 DIAGNOSIS — J209 Acute bronchitis, unspecified: Secondary | ICD-10-CM

## 2013-08-07 DIAGNOSIS — Z0000 Encounter for general adult medical examination without abnormal findings: Secondary | ICD-10-CM

## 2013-08-07 DIAGNOSIS — R9389 Abnormal findings on diagnostic imaging of other specified body structures: Secondary | ICD-10-CM

## 2013-08-07 DIAGNOSIS — R918 Other nonspecific abnormal finding of lung field: Secondary | ICD-10-CM

## 2013-08-07 LAB — URINALYSIS, ROUTINE W REFLEX MICROSCOPIC
Bilirubin Urine: NEGATIVE
Hgb urine dipstick: NEGATIVE
Ketones, ur: NEGATIVE
Nitrite: NEGATIVE
PH: 7 (ref 5.0–8.0)
RBC / HPF: NONE SEEN (ref 0–?)
Specific Gravity, Urine: <=1.005 — AB (ref 1.000–1.030)
Total Protein, Urine: NEGATIVE
Urine Glucose: NEGATIVE
Urobilinogen, UA: 0.2 (ref 0.0–1.0)

## 2013-08-07 LAB — CBC WITH DIFFERENTIAL/PLATELET
BASOS ABS: 0 10*3/uL (ref 0.0–0.1)
Basophils Relative: 0.7 % (ref 0.0–3.0)
EOS ABS: 0.2 10*3/uL (ref 0.0–0.7)
Eosinophils Relative: 3.1 % (ref 0.0–5.0)
HCT: 40.3 % (ref 36.0–46.0)
Hemoglobin: 13.4 g/dL (ref 12.0–15.0)
LYMPHS PCT: 30.4 % (ref 12.0–46.0)
Lymphs Abs: 1.6 10*3/uL (ref 0.7–4.0)
MCHC: 33.3 g/dL (ref 30.0–36.0)
MCV: 89.5 fl (ref 78.0–100.0)
Monocytes Absolute: 0.4 10*3/uL (ref 0.1–1.0)
Monocytes Relative: 8.5 % (ref 3.0–12.0)
NEUTROS PCT: 57.3 % (ref 43.0–77.0)
Neutro Abs: 2.9 10*3/uL (ref 1.4–7.7)
Platelets: 264 10*3/uL (ref 150.0–400.0)
RBC: 4.5 Mil/uL (ref 3.87–5.11)
RDW: 13.9 % (ref 11.5–15.5)
WBC: 5.1 10*3/uL (ref 4.0–10.5)

## 2013-08-07 LAB — HEPATIC FUNCTION PANEL
ALT: 19 U/L (ref 0–35)
AST: 24 U/L (ref 0–37)
Albumin: 4 g/dL (ref 3.5–5.2)
Alkaline Phosphatase: 47 U/L (ref 39–117)
BILIRUBIN DIRECT: 0.1 mg/dL (ref 0.0–0.3)
BILIRUBIN TOTAL: 0.8 mg/dL (ref 0.2–1.2)
TOTAL PROTEIN: 6.9 g/dL (ref 6.0–8.3)

## 2013-08-07 LAB — LIPID PANEL
CHOL/HDL RATIO: 2
Cholesterol: 183 mg/dL (ref 0–200)
HDL: 76.1 mg/dL (ref >39.00)
LDL CALC: 92 mg/dL (ref 0–99)
Triglycerides: 74 mg/dL (ref 0.0–149.0)
VLDL: 14.8 mg/dL (ref 0.0–40.0)

## 2013-08-07 LAB — BASIC METABOLIC PANEL
BUN: 9 mg/dL (ref 6–23)
CALCIUM: 9.1 mg/dL (ref 8.4–10.5)
CO2: 30 meq/L (ref 19–32)
CREATININE: 0.7 mg/dL (ref 0.4–1.2)
Chloride: 104 mEq/L (ref 96–112)
GFR: 90.15 mL/min (ref >60.00)
GLUCOSE: 92 mg/dL (ref 70–99)
Potassium: 3.5 mEq/L (ref 3.5–5.1)
Sodium: 141 mEq/L (ref 135–145)

## 2013-08-07 LAB — TSH: TSH: 4.8 u[IU]/mL — ABNORMAL HIGH (ref 0.35–4.50)

## 2013-08-07 NOTE — Telephone Encounter (Signed)
Patient went down stairs to have lab work.  Lab needs an order put in for urine test.

## 2013-08-07 NOTE — Telephone Encounter (Signed)
UA order entered

## 2013-08-23 ENCOUNTER — Encounter: Payer: Self-pay | Admitting: Internal Medicine

## 2013-08-23 ENCOUNTER — Ambulatory Visit (INDEPENDENT_AMBULATORY_CARE_PROVIDER_SITE_OTHER): Payer: Medicare Other | Admitting: Internal Medicine

## 2013-08-23 VITALS — BP 180/82 | HR 72 | Temp 98.5°F | Resp 16 | Wt 103.0 lb

## 2013-08-23 DIAGNOSIS — Z0000 Encounter for general adult medical examination without abnormal findings: Secondary | ICD-10-CM

## 2013-08-23 DIAGNOSIS — E785 Hyperlipidemia, unspecified: Secondary | ICD-10-CM

## 2013-08-23 DIAGNOSIS — R918 Other nonspecific abnormal finding of lung field: Secondary | ICD-10-CM

## 2013-08-23 DIAGNOSIS — M810 Age-related osteoporosis without current pathological fracture: Secondary | ICD-10-CM

## 2013-08-23 DIAGNOSIS — R030 Elevated blood-pressure reading, without diagnosis of hypertension: Secondary | ICD-10-CM

## 2013-08-23 DIAGNOSIS — R7989 Other specified abnormal findings of blood chemistry: Secondary | ICD-10-CM | POA: Insufficient documentation

## 2013-08-23 DIAGNOSIS — M545 Low back pain, unspecified: Secondary | ICD-10-CM

## 2013-08-23 DIAGNOSIS — R9389 Abnormal findings on diagnostic imaging of other specified body structures: Secondary | ICD-10-CM

## 2013-08-23 DIAGNOSIS — R946 Abnormal results of thyroid function studies: Secondary | ICD-10-CM

## 2013-08-23 MED ORDER — RALOXIFENE HCL 60 MG PO TABS: 60.0000 mg | ORAL_TABLET | Freq: Every day | ORAL | Status: DC

## 2013-08-23 NOTE — Assessment & Plan Note (Signed)
Abn resolved

## 2013-08-23 NOTE — Assessment & Plan Note (Signed)
Resolved

## 2013-08-23 NOTE — Patient Instructions (Signed)
Normal BP<130/85 

## 2013-08-23 NOTE — Assessment & Plan Note (Signed)
Normal BP at home.

## 2013-08-23 NOTE — Progress Notes (Signed)
Subjective:    HPI  F/u  abn CXR The patient has been doing well overall without major physical or psychological issues going on lately.   BP is nl at home - 127/71  BP Readings from Last 3 Encounters:  08/23/13 180/82  05/24/13 148/82  05/22/13 110/66   Wt Readings from Last 3 Encounters:  08/23/13 103 lb (46.72 kg)  05/24/13 102 lb (46.267 kg)  05/22/13 101 lb 1.3 oz (45.85 kg)      Review of Systems  Constitutional: Negative.  Negative for fever, chills, diaphoresis, activity change, appetite change, fatigue and unexpected weight change.  HENT: Negative for congestion, ear pain, facial swelling, hearing loss, mouth sores, nosebleeds, postnasal drip, rhinorrhea, sinus pressure, sneezing, sore throat, tinnitus and trouble swallowing.   Eyes: Negative for pain, discharge, redness, itching and visual disturbance.  Respiratory: Negative for cough, chest tightness, shortness of breath, wheezing and stridor.   Cardiovascular: Negative for chest pain, palpitations and leg swelling.  Gastrointestinal: Negative for nausea, diarrhea, constipation, blood in stool, abdominal distention, anal bleeding and rectal pain.  Genitourinary: Negative for dysuria, urgency, frequency, hematuria, flank pain, vaginal bleeding, vaginal discharge, difficulty urinating, genital sores and pelvic pain.  Musculoskeletal: Negative for arthralgias, back pain, gait problem, joint swelling, neck pain and neck stiffness.  Skin: Negative.  Negative for rash.  Neurological: Negative for dizziness, tremors, seizures, syncope, speech difficulty, weakness, numbness and headaches.  Hematological: Negative for adenopathy. Does not bruise/bleed easily.  Psychiatric/Behavioral: Negative for suicidal ideas, behavioral problems, sleep disturbance, dysphoric mood and decreased concentration. The patient is not nervous/anxious.        Objective:   Physical Exam  Constitutional: She appears well-developed and  well-nourished. No distress.  Thin, in NAD  HENT:  Head: Normocephalic.  Right Ear: External ear normal.  Left Ear: External ear normal.  Nose: Nose normal.  Mouth/Throat: Oropharynx is clear and moist.  Eyes: Conjunctivae are normal. Pupils are equal, round, and reactive to light. Right eye exhibits no discharge. Left eye exhibits no discharge.  Neck: Normal range of motion. Neck supple. No JVD present. No tracheal deviation present. No thyromegaly present.  Cardiovascular: Normal rate, regular rhythm and normal heart sounds.   Pulmonary/Chest: No stridor. No respiratory distress. She has no wheezes.  Abdominal: Soft. Bowel sounds are normal. She exhibits no distension and no mass. There is no tenderness. There is no rebound and no guarding.  Musculoskeletal: She exhibits no edema and no tenderness.  Lymphadenopathy:    She has no cervical adenopathy.  Neurological: She displays normal reflexes. No cranial nerve deficit. She exhibits normal muscle tone. Coordination normal.  Skin: No rash noted. No erythema.  Psychiatric: She has a normal mood and affect. Her behavior is normal. Judgment and thought content normal.    CXR:  IMPRESSION:  No acute cardiopulmonary findings.  The nodular density in the right lung base is no longer identified.  New vague density in the left lung is likely a focus of atelectasis.  Electronically Signed  By: Kalman Jewels M.D.  On: 06/11/2013 09:05      Lab Results  Component Value Date   WBC 5.1 08/07/2013   HGB 13.4 08/07/2013   HCT 40.3 08/07/2013   PLT 264.0 08/07/2013   CHOL 183 08/07/2013   TRIG 74.0 08/07/2013   HDL 76.10 08/07/2013   ALT 19 08/07/2013   AST 24 08/07/2013   NA 141 08/07/2013   K 3.5 08/07/2013   CL 104 08/07/2013   CREATININE 0.7  08/07/2013   BUN 9 08/07/2013   CO2 30 08/07/2013   TSH 4.80* 08/07/2013     Assessment & Plan:

## 2013-08-23 NOTE — Assessment & Plan Note (Signed)
Repeat TSH and FT4

## 2013-08-23 NOTE — Assessment & Plan Note (Signed)

## 2013-08-23 NOTE — Progress Notes (Signed)
Pre visit review using our clinic review tool, if applicable. No additional management support is needed unless otherwise documented below in the visit note. 

## 2013-08-29 LAB — COLOGUARD

## 2013-09-17 ENCOUNTER — Encounter: Payer: Self-pay | Admitting: Internal Medicine

## 2013-09-25 ENCOUNTER — Other Ambulatory Visit (INDEPENDENT_AMBULATORY_CARE_PROVIDER_SITE_OTHER): Payer: Medicare Other

## 2013-09-25 DIAGNOSIS — R7989 Other specified abnormal findings of blood chemistry: Secondary | ICD-10-CM

## 2013-09-25 DIAGNOSIS — E785 Hyperlipidemia, unspecified: Secondary | ICD-10-CM

## 2013-09-25 LAB — TSH: TSH: 5.37 u[IU]/mL — AB (ref 0.35–4.50)

## 2013-09-25 LAB — T4, FREE: Free T4: 0.83 ng/dL (ref 0.60–1.60)

## 2013-09-26 ENCOUNTER — Encounter: Payer: Self-pay | Admitting: Internal Medicine

## 2013-10-25 ENCOUNTER — Encounter: Payer: Self-pay | Admitting: Internal Medicine

## 2013-11-23 ENCOUNTER — Encounter: Payer: Self-pay | Admitting: Internal Medicine

## 2013-11-23 ENCOUNTER — Ambulatory Visit (INDEPENDENT_AMBULATORY_CARE_PROVIDER_SITE_OTHER): Payer: Self-pay | Admitting: Internal Medicine

## 2013-11-23 VITALS — BP 148/82 | HR 84 | Ht 61.5 in | Wt 101.2 lb

## 2013-11-23 DIAGNOSIS — Z1211 Encounter for screening for malignant neoplasm of colon: Secondary | ICD-10-CM

## 2013-11-23 NOTE — Progress Notes (Signed)
Patient ID: Joy Thompson, female   DOB: Sep 04, 1940, 73 y.o.   MRN: [id number]       The patient is here with her husband discuss repeating a colonoscopy. She had a negative colonoscopy 2004 she had a right-sided colon polyp removed but it turned out to be a benign colonic mucosa polyp. She had repeat colonoscopy June 2008 with no polyps. Her prior gastroenterologist recommended a repeat colonoscopy in 7 years which is around now. She has no active symptoms. She actually did a Cologuard test in June but there was insufficient quantity.  1. Special screening for malignant neoplasms, colon      She does not technically need a colonoscopy at this point based upon her history. She had a Cologuard test done, we will reactivate that and she will resubmit it is reasonable to do one of those at this point. If positive would proceed with a diagnostic colonoscopy. If that is negative we will consider repeating a colonoscopy or Cologuard in 3 years, a June 2018 colonoscopy recall will be placed. All this was explained to the patient and she and her husband were satisfied with the explanation plans.  The patient was not charged for this visit to discuss screening.  I appreciate the opportunity to care for this patient. CC: Walker Kehr, MD

## 2013-11-23 NOTE — Patient Instructions (Addendum)
The cologuard  Kit will be re-shipped to you in 3-7 days via [employer].  I just reordered it from where Dr. Alain Marion had done it originally and she didn't send in enough stool.  You are in our system for a colonoscopy recall for June 2018.  I appreciate the opportunity to care for you.

## 2013-12-04 LAB — COLOGUARD

## 2013-12-13 ENCOUNTER — Telehealth: Payer: Self-pay | Admitting: *Deleted

## 2013-12-13 NOTE — Telephone Encounter (Signed)
Did she re-do the test? I do not have the 2nd one Thx

## 2013-12-13 NOTE — Telephone Encounter (Signed)
Left msg on triage requesting colo-guard screening results...Joy Thompson

## 2013-12-14 NOTE — Telephone Encounter (Signed)
Called pt no answer LMOM RTC.../lmb 

## 2013-12-14 NOTE — Telephone Encounter (Signed)
Called pt again she stated she did have the test done a second time on 12/03/13. Called [facility] spoke with rep the fax went to the wrong fax #. Gave her md correct fax #. She also gave me a verbal stating the results was NEGATIVE, but also faxing copy of the report to md for his records. Called pt back inform her the results was NEGATIVE.../LMB

## 2013-12-17 ENCOUNTER — Telehealth: Payer: Self-pay | Admitting: Internal Medicine

## 2013-12-17 NOTE — Telephone Encounter (Signed)
Received 2 pages from [employer], sent to Dr. Alain Marion. 12/17/13/ss.

## 2013-12-24 NOTE — Telephone Encounter (Signed)
I do not have the new report. Do we have it?  Thx

## 2013-12-24 NOTE — Telephone Encounter (Signed)
Yes was faxed on 12/14/13 was on [place] desk will place on md desk...Joy Thompson

## 2013-12-24 NOTE — Telephone Encounter (Signed)
It is negative (good!). Thx

## 2013-12-31 ENCOUNTER — Encounter: Payer: Self-pay | Admitting: Internal Medicine

## 2014-01-21 ENCOUNTER — Encounter: Payer: Self-pay | Admitting: Internal Medicine

## 2014-02-12 ENCOUNTER — Encounter: Payer: Self-pay | Admitting: Gynecology

## 2014-02-12 ENCOUNTER — Other Ambulatory Visit (HOSPITAL_COMMUNITY)
Admission: RE | Admit: 2014-02-12 | Discharge: 2014-02-12 | Disposition: A | Discharge status: Home / Self Care | Payer: Medicare Other | Source: Ambulatory Visit | Attending: Gynecology | Admitting: Gynecology

## 2014-02-12 ENCOUNTER — Ambulatory Visit (INDEPENDENT_AMBULATORY_CARE_PROVIDER_SITE_OTHER): Payer: Medicare Other | Admitting: Gynecology

## 2014-02-12 VITALS — BP 142/92 | Ht 61.25 in | Wt 103.0 lb

## 2014-02-12 DIAGNOSIS — Z124 Encounter for screening for malignant neoplasm of cervix: Secondary | ICD-10-CM | POA: Insufficient documentation

## 2014-02-12 DIAGNOSIS — N950 Postmenopausal bleeding: Secondary | ICD-10-CM

## 2014-02-12 DIAGNOSIS — M8580 Other specified disorders of bone density and structure, unspecified site: Secondary | ICD-10-CM

## 2014-02-12 DIAGNOSIS — N952 Postmenopausal atrophic vaginitis: Secondary | ICD-10-CM | POA: Insufficient documentation
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
## 2014-02-18 LAB — CYTOLOGY - PAP

## 2014-02-22 ENCOUNTER — Encounter: Payer: Self-pay | Admitting: Internal Medicine

## 2014-02-22 ENCOUNTER — Ambulatory Visit (INDEPENDENT_AMBULATORY_CARE_PROVIDER_SITE_OTHER): Payer: Medicare Other | Admitting: Internal Medicine

## 2014-02-22 ENCOUNTER — Other Ambulatory Visit (INDEPENDENT_AMBULATORY_CARE_PROVIDER_SITE_OTHER): Payer: Medicare Other

## 2014-02-22 ENCOUNTER — Telehealth: Payer: Self-pay | Admitting: Internal Medicine

## 2014-02-22 VITALS — BP 160/94 | HR 101 | Temp 98.7°F | Wt 103.0 lb

## 2014-02-22 DIAGNOSIS — R9389 Abnormal findings on diagnostic imaging of other specified body structures: Secondary | ICD-10-CM

## 2014-02-22 DIAGNOSIS — M810 Age-related osteoporosis without current pathological fracture: Secondary | ICD-10-CM

## 2014-02-22 DIAGNOSIS — R938 Abnormal findings on diagnostic imaging of other specified body structures: Secondary | ICD-10-CM

## 2014-02-22 DIAGNOSIS — R7989 Other specified abnormal findings of blood chemistry: Secondary | ICD-10-CM

## 2014-02-22 DIAGNOSIS — R030 Elevated blood-pressure reading, without diagnosis of hypertension: Secondary | ICD-10-CM

## 2014-02-22 DIAGNOSIS — Z79899 Other long term (current) drug therapy: Secondary | ICD-10-CM

## 2014-02-22 NOTE — Assessment & Plan Note (Signed)
Continue with current prescription therapy as reflected on the Med list.  

## 2014-02-22 NOTE — Telephone Encounter (Signed)
Pt needs orders for labs in June 2016 wellness , pt states insurance will pay.

## 2014-02-22 NOTE — Assessment & Plan Note (Signed)
TSH 

## 2014-02-22 NOTE — Progress Notes (Signed)
Subjective:    HPI  F/u  abn CXR The patient has been doing well overall without major physical or psychological issues going on lately.   BP is nl at home - 127/71  BP Readings from Last 3 Encounters:  02/22/14 160/94  02/12/14 142/92  11/23/13 148/82   Wt Readings from Last 3 Encounters:  02/22/14 103 lb (46.72 kg)  02/12/14 103 lb (46.72 kg)  11/23/13 101 lb 4 oz (45.927 kg)      Review of Systems  Constitutional: Negative.  Negative for fever, chills, diaphoresis, activity change, appetite change, fatigue and unexpected weight change.  HENT: Negative for congestion, ear pain, facial swelling, hearing loss, mouth sores, nosebleeds, postnasal drip, rhinorrhea, sinus pressure, sneezing, sore throat, tinnitus and trouble swallowing.   Eyes: Negative for pain, discharge, redness, itching and visual disturbance.  Respiratory: Negative for cough, chest tightness, shortness of breath, wheezing and stridor.   Cardiovascular: Negative for chest pain, palpitations and leg swelling.  Gastrointestinal: Negative for nausea, diarrhea, constipation, blood in stool, abdominal distention, anal bleeding and rectal pain.  Genitourinary: Negative for dysuria, urgency, frequency, hematuria, flank pain, vaginal bleeding, vaginal discharge, difficulty urinating, genital sores and pelvic pain.  Musculoskeletal: Negative for back pain, joint swelling, arthralgias, gait problem, neck pain and neck stiffness.  Skin: Negative.  Negative for rash.  Neurological: Negative for dizziness, tremors, seizures, syncope, speech difficulty, weakness, numbness and headaches.  Hematological: Negative for adenopathy. Does not bruise/bleed easily.  Psychiatric/Behavioral: Negative for suicidal ideas, behavioral problems, sleep disturbance, dysphoric mood and decreased concentration. The patient is not nervous/anxious.        Objective:   Physical Exam  Constitutional: She appears well-developed. No distress.   HENT:  Head: Normocephalic.  Right Ear: External ear normal.  Left Ear: External ear normal.  Nose: Nose normal.  Mouth/Throat: Oropharynx is clear and moist.  Eyes: Conjunctivae are normal. Pupils are equal, round, and reactive to light. Right eye exhibits no discharge. Left eye exhibits no discharge.  Neck: Normal range of motion. Neck supple. No JVD present. No tracheal deviation present. No thyromegaly present.  Cardiovascular: Normal rate, regular rhythm and normal heart sounds.   Pulmonary/Chest: No stridor. No respiratory distress. She has no wheezes.  Abdominal: Soft. Bowel sounds are normal. She exhibits no distension and no mass. There is no tenderness. There is no rebound and no guarding.  Musculoskeletal: She exhibits no edema or tenderness.  Lymphadenopathy:    She has no cervical adenopathy.  Neurological: She displays normal reflexes. No cranial nerve deficit. She exhibits normal muscle tone. Coordination normal.  Skin: No rash noted. No erythema.  Psychiatric: She has a normal mood and affect. Her behavior is normal. Judgment and thought content normal.    CXR:  IMPRESSION:  No acute cardiopulmonary findings.  The nodular density in the right lung base is no longer identified.  New vague density in the left lung is likely a focus of atelectasis.  Electronically Signed  By: Kalman Jewels M.D.  On: 06/11/2013 09:05   CXR 06/11/13 IMPRESSION: No acute cardiopulmonary findings.     Lab Results  Component Value Date   WBC 5.1 08/07/2013   HGB 13.4 08/07/2013   HCT 40.3 08/07/2013   PLT 264.0 08/07/2013   CHOL 183 08/07/2013   TRIG 74.0 08/07/2013   HDL 76.10 08/07/2013   ALT 19 08/07/2013   AST 24 08/07/2013   NA 141 08/07/2013   K 3.5 08/07/2013   CL 104 08/07/2013   CREATININE  0.7 08/07/2013   BUN 9 08/07/2013   CO2 30 08/07/2013   TSH 5.37* 09/25/2013     Assessment & Plan:

## 2014-02-22 NOTE — Assessment & Plan Note (Signed)
BP is good at home 

## 2014-02-22 NOTE — Progress Notes (Signed)
Pre visit review using our clinic review tool, if applicable. No additional management support is needed unless otherwise documented below in the visit note. 

## 2014-02-22 NOTE — Assessment & Plan Note (Signed)
The last CXR was nl  IMPRESSION: No acute cardiopulmonary findings.

## 2014-02-23 LAB — T4, FREE: Free T4: 1.3 ng/dL (ref 0.60–1.60)

## 2014-02-23 LAB — VITAMIN D 25 HYDROXY (VIT D DEFICIENCY, FRACTURES): VITD: 60.22 ng/mL (ref 30.00–100.00)

## 2014-02-23 LAB — TSH: TSH: 4.97 u[IU]/mL — AB (ref 0.35–4.50)

## 2014-03-06 ENCOUNTER — Other Ambulatory Visit: Payer: Medicare Other

## 2014-03-06 ENCOUNTER — Ambulatory Visit: Payer: Medicare Other | Admitting: Gynecology

## 2014-03-13 ENCOUNTER — Other Ambulatory Visit: Payer: Self-pay | Admitting: Gynecology

## 2014-03-13 ENCOUNTER — Encounter: Payer: Self-pay | Admitting: Gynecology

## 2014-03-13 ENCOUNTER — Ambulatory Visit (INDEPENDENT_AMBULATORY_CARE_PROVIDER_SITE_OTHER): Payer: Medicare Other

## 2014-03-13 ENCOUNTER — Ambulatory Visit (INDEPENDENT_AMBULATORY_CARE_PROVIDER_SITE_OTHER): Payer: Medicare Other | Admitting: Gynecology

## 2014-03-13 VITALS — BP 132/78

## 2014-03-13 DIAGNOSIS — M810 Age-related osteoporosis without current pathological fracture: Secondary | ICD-10-CM

## 2014-03-13 DIAGNOSIS — N950 Postmenopausal bleeding: Secondary | ICD-10-CM

## 2014-03-13 DIAGNOSIS — R339 Retention of urine, unspecified: Secondary | ICD-10-CM

## 2014-03-13 DIAGNOSIS — N83319 Acquired atrophy of ovary, unspecified side: Secondary | ICD-10-CM

## 2014-03-13 DIAGNOSIS — N8331 Acquired atrophy of ovary: Secondary | ICD-10-CM

## 2014-03-13 NOTE — Progress Notes (Signed)
    Patient presented to the office today discuss her ultrasound report. Patient was seen in the office on 02/12/2014 for annual gynecological examination. During that time she had reported that a few weeks prior that while wiping she noticed a slight streak of blood in her tissue but has not occurred again. Patient is on no hormone replacement therapy. She does have history of osteoporosis when in the past she had been on Actonel and was switched over to Evista which she currently takes 60 mg daily. Patient's last bone density study was in 2010 with her lowest T score the AP spine of -2.8. She is taking calcium and vitamin D and has an active lifestyle. Her records also indicated that her last colonoscopy was normal in 2008 but she does have history of colonic polyps in 2004.Patient with no prior history of abnormal Pap smears. She's currently being followed by her primary physician Dr. Alain Marion Who is doing her blood work.  Her Pap smear last month was normal.   on that last office visit we had discussed that I want her to come in for an ultrasound to determine to her endometrial stripe thickness the endometrial stripe is greater than 5 mm we'll proceed with an endometrial biopsy. I believe the isolated event of spotting may have been from vaginal atrophy and irritation from what I saw on the exam  On last visit.   Ultrasound today: Uterus measured 5.4 x 5.4 x 3.1 centimeters endometrial stripe 4.6 mm. An endometrial stripe was avascular. Right ovary atrophic normal echo pattern. Left ovary was not seen. Excessive bowel shadowing was noted. No apparent adnexal masses were noted. 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 No recurrence. Endometrial stripe less than 5 mm. We'll continue to observe. Patient will return if she has a recurrence otherwise we'll see her in one year or when necessary.   Patient scheduled to see her PCP in  March she was reminded to  Discussed with him about scheduling her bone density study since her last was in 2010.

## 2014-05-27 ENCOUNTER — Other Ambulatory Visit: Payer: Self-pay

## 2014-05-27 ENCOUNTER — Other Ambulatory Visit (INDEPENDENT_AMBULATORY_CARE_PROVIDER_SITE_OTHER): Payer: Medicare Other

## 2014-05-27 DIAGNOSIS — M810 Age-related osteoporosis without current pathological fracture: Secondary | ICD-10-CM

## 2014-05-27 DIAGNOSIS — Z79899 Other long term (current) drug therapy: Secondary | ICD-10-CM | POA: Diagnosis not present

## 2014-05-27 DIAGNOSIS — R7989 Other specified abnormal findings of blood chemistry: Secondary | ICD-10-CM

## 2014-05-27 LAB — TSH: TSH: 5.48 u[IU]/mL — AB (ref 0.35–4.50)

## 2014-05-27 LAB — T4, FREE: FREE T4: 1.22 ng/dL (ref 0.60–1.60)

## 2014-05-28 LAB — VITAMIN D 25 HYDROXY (VIT D DEFICIENCY, FRACTURES): VITD: 57.47 ng/mL (ref 30.00–100.00)

## 2014-08-14 ENCOUNTER — Other Ambulatory Visit (INDEPENDENT_AMBULATORY_CARE_PROVIDER_SITE_OTHER): Payer: Medicare Other

## 2014-08-14 ENCOUNTER — Other Ambulatory Visit: Payer: Self-pay | Admitting: *Deleted

## 2014-08-14 DIAGNOSIS — I10 Essential (primary) hypertension: Secondary | ICD-10-CM

## 2014-08-14 DIAGNOSIS — Z79899 Other long term (current) drug therapy: Secondary | ICD-10-CM | POA: Diagnosis not present

## 2014-08-14 DIAGNOSIS — Z0000 Encounter for general adult medical examination without abnormal findings: Secondary | ICD-10-CM

## 2014-08-14 LAB — LIPID PANEL
CHOL/HDL RATIO: 2
CHOLESTEROL: 168 mg/dL (ref 0–200)
HDL: 76.3 mg/dL (ref >39.00)
LDL Cholesterol: 80 mg/dL (ref 0–99)
NonHDL: 91.7
TRIGLYCERIDES: 61 mg/dL (ref 0.0–149.0)
VLDL: 12.2 mg/dL (ref 0.0–40.0)

## 2014-08-14 LAB — CBC WITH DIFFERENTIAL/PLATELET
BASOS PCT: 0.3 % (ref 0.0–3.0)
Basophils Absolute: 0 10*3/uL (ref 0.0–0.1)
Eosinophils Absolute: 0.1 10*3/uL (ref 0.0–0.7)
Eosinophils Relative: 2.8 % (ref 0.0–5.0)
HCT: 41.7 % (ref 36.0–46.0)
HEMOGLOBIN: 14.1 g/dL (ref 12.0–15.0)
Lymphocytes Relative: 28.9 % (ref 12.0–46.0)
Lymphs Abs: 1.4 10*3/uL (ref 0.7–4.0)
MCHC: 33.8 g/dL (ref 30.0–36.0)
MCV: 88 fl (ref 78.0–100.0)
Monocytes Absolute: 0.6 10*3/uL (ref 0.1–1.0)
Monocytes Relative: 12 % (ref 3.0–12.0)
Neutro Abs: 2.8 10*3/uL (ref 1.4–7.7)
Neutrophils Relative %: 56 % (ref 43.0–77.0)
PLATELETS: 266 10*3/uL (ref 150.0–400.0)
RBC: 4.74 Mil/uL (ref 3.87–5.11)
RDW: 13.8 % (ref 11.5–15.5)
WBC: 5 10*3/uL (ref 4.0–10.5)

## 2014-08-14 LAB — HEPATIC FUNCTION PANEL
ALT: 15 U/L (ref 0–35)
AST: 22 U/L (ref 0–37)
Albumin: 4.2 g/dL (ref 3.5–5.2)
Alkaline Phosphatase: 63 U/L (ref 39–117)
Bilirubin, Direct: 0.1 mg/dL (ref 0.0–0.3)
TOTAL PROTEIN: 6.8 g/dL (ref 6.0–8.3)
Total Bilirubin: 0.5 mg/dL (ref 0.2–1.2)

## 2014-08-14 LAB — URINALYSIS, ROUTINE W REFLEX MICROSCOPIC
BILIRUBIN URINE: NEGATIVE
HGB URINE DIPSTICK: NEGATIVE
KETONES UR: NEGATIVE
Leukocytes, UA: NEGATIVE
Nitrite: NEGATIVE
Specific Gravity, Urine: <=1.005 — AB (ref 1.000–1.030)
Total Protein, Urine: NEGATIVE
URINE GLUCOSE: NEGATIVE
UROBILINOGEN UA: 0.2 (ref 0.0–1.0)
pH: 7 (ref 5.0–8.0)

## 2014-08-14 LAB — BASIC METABOLIC PANEL
BUN: 12 mg/dL (ref 6–23)
CHLORIDE: 104 meq/L (ref 96–112)
CO2: 33 mEq/L — ABNORMAL HIGH (ref 19–32)
Calcium: 10.1 mg/dL (ref 8.4–10.5)
Creatinine, Ser: 0.75 mg/dL (ref 0.40–1.20)
GFR: 80.29 mL/min (ref >60.00)
GLUCOSE: 96 mg/dL (ref 70–99)
POTASSIUM: 4.3 meq/L (ref 3.5–5.1)
Sodium: 142 mEq/L (ref 135–145)

## 2014-08-14 LAB — TSH: TSH: 4.18 u[IU]/mL (ref 0.35–4.50)

## 2014-08-28 ENCOUNTER — Encounter: Payer: Self-pay | Admitting: Internal Medicine

## 2014-08-28 ENCOUNTER — Ambulatory Visit (INDEPENDENT_AMBULATORY_CARE_PROVIDER_SITE_OTHER): Payer: Medicare Other | Admitting: Internal Medicine

## 2014-08-28 ENCOUNTER — Ambulatory Visit (INDEPENDENT_AMBULATORY_CARE_PROVIDER_SITE_OTHER)
Admission: RE | Admit: 2014-08-28 | Discharge: 2014-08-28 | Disposition: A | Discharge status: Home / Self Care | Payer: Medicare Other | Source: Ambulatory Visit | Attending: Internal Medicine | Admitting: Internal Medicine

## 2014-08-28 VITALS — BP 160/100 | HR 76 | Ht 61.5 in | Wt 102.0 lb

## 2014-08-28 DIAGNOSIS — Z23 Encounter for immunization: Secondary | ICD-10-CM

## 2014-08-28 DIAGNOSIS — Z0000 Encounter for general adult medical examination without abnormal findings: Secondary | ICD-10-CM | POA: Diagnosis not present

## 2014-08-28 DIAGNOSIS — M810 Age-related osteoporosis without current pathological fracture: Secondary | ICD-10-CM | POA: Diagnosis not present

## 2014-08-28 DIAGNOSIS — E785 Hyperlipidemia, unspecified: Secondary | ICD-10-CM

## 2014-08-28 DIAGNOSIS — R030 Elevated blood-pressure reading, without diagnosis of hypertension: Secondary | ICD-10-CM

## 2014-08-28 MED ORDER — RALOXIFENE HCL 60 MG PO TABS: 60.0000 mg | ORAL_TABLET | Freq: Every day | ORAL | Status: DC

## 2014-08-28 NOTE — Progress Notes (Signed)
Pre visit review using our clinic review tool, if applicable. No additional management support is needed unless otherwise documented below in the visit note. 

## 2014-08-28 NOTE — Assessment & Plan Note (Signed)
Dexa scan Evista

## 2014-08-28 NOTE — Assessment & Plan Note (Signed)
Here for medicare wellness/physical  Diet: heart healthy  Physical activity: not sedentary  Depression/mood screen: negative  Hearing: intact to whispered voice  Visual acuity: grossly normal, performs annual eye exam  ADLs: capable  Fall risk: none  Home safety: good  Cognitive evaluation: intact to orientation, naming, recall and repetition  EOL planning: adv directives, full code/ I agree  I have personally reviewed and have noted  1. The patient's medical, surgical and social history  2. Their use of alcohol, tobacco or illicit drugs  3. Their current medications and supplements  4. The patient's functional ability including ADL's, fall risks, home safety risks and hearing or visual impairment.  5. Diet and physical activities  6. Evidence for depression or mood disorders 7. The roster of all physicians providing medical care to patient - is listed in the Snapshot section of the chart and reviewed today.    Today patient counseled on age appropriate routine health concerns for screening and prevention, each reviewed and up to date or declined. Immunizations reviewed and up to date or declined. Labs ordered and reviewed. Risk factors for depression reviewed and negative. Hearing function and visual acuity are intact. ADLs screened and addressed as needed. Functional ability and level of safety reviewed and appropriate. Education, counseling and referrals performed based on assessed risks today. Patient provided with a copy of personalized plan for preventive services.     DT, DEXA

## 2014-08-28 NOTE — Progress Notes (Signed)
Subjective:    HPI  The patient is here for a wellness exam. The patient has been doing well overall without major physical or psychological issues going on lately.   The patient has been doing well overall without major physical or psychological issues going on lately.   BP is nl at home - 127/71  BP Readings from Last 3 Encounters:  08/28/14 160/100  03/13/14 132/78  02/22/14 160/94   Wt Readings from Last 3 Encounters:  08/28/14 102 lb (46.267 kg)  02/22/14 103 lb (46.72 kg)  02/12/14 103 lb (46.72 kg)      Review of Systems  Constitutional: Negative.  Negative for fever, chills, diaphoresis, activity change, appetite change, fatigue and unexpected weight change.  HENT: Negative for congestion, ear pain, facial swelling, hearing loss, mouth sores, nosebleeds, postnasal drip, rhinorrhea, sinus pressure, sneezing, sore throat, tinnitus and trouble swallowing.   Eyes: Negative for pain, discharge, redness, itching and visual disturbance.  Respiratory: Negative for cough, chest tightness, shortness of breath, wheezing and stridor.   Cardiovascular: Negative for chest pain, palpitations and leg swelling.  Gastrointestinal: Negative for nausea, diarrhea, constipation, blood in stool, abdominal distention, anal bleeding and rectal pain.  Genitourinary: Negative for dysuria, urgency, frequency, hematuria, flank pain, vaginal bleeding, vaginal discharge, difficulty urinating, genital sores and pelvic pain.  Musculoskeletal: Negative for back pain, joint swelling, arthralgias, gait problem, neck pain and neck stiffness.  Skin: Negative.  Negative for rash.  Neurological: Negative for dizziness, tremors, seizures, syncope, speech difficulty, weakness, numbness and headaches.  Hematological: Negative for adenopathy. Does not bruise/bleed easily.  Psychiatric/Behavioral: Negative for suicidal ideas, behavioral problems, sleep disturbance, dysphoric mood and decreased concentration. The  patient is not nervous/anxious.        Objective:   Physical Exam  Constitutional: She appears well-developed and well-nourished. No distress.  Thin, in NAD  HENT:  Head: Normocephalic.  Right Ear: External ear normal.  Left Ear: External ear normal.  Nose: Nose normal.  Mouth/Throat: Oropharynx is clear and moist.  Eyes: Conjunctivae are normal. Pupils are equal, round, and reactive to light. Right eye exhibits no discharge. Left eye exhibits no discharge.  Neck: Normal range of motion. Neck supple. No JVD present. No tracheal deviation present. No thyromegaly present.  Cardiovascular: Normal rate, regular rhythm and normal heart sounds.   Pulmonary/Chest: No stridor. No respiratory distress. She has no wheezes.  Abdominal: Soft. Bowel sounds are normal. She exhibits no distension and no mass. There is no tenderness. There is no rebound and no guarding.  Musculoskeletal: She exhibits no edema or tenderness.  Lymphadenopathy:    She has no cervical adenopathy.  Neurological: She displays normal reflexes. No cranial nerve deficit. She exhibits normal muscle tone. Coordination normal.  Skin: No rash noted. No erythema.  Psychiatric: She has a normal mood and affect. Her behavior is normal. Judgment and thought content normal.    CXR:  IMPRESSION:  No acute cardiopulmonary findings.  The nodular density in the right lung base is no longer identified.  New vague density in the left lung is likely a focus of atelectasis.  Electronically Signed  By: Kalman Jewels M.D.  On: 06/11/2013 09:05      Lab Results  Component Value Date   WBC 5.0 08/14/2014   HGB 14.1 08/14/2014   HCT 41.7 08/14/2014   PLT 266.0 08/14/2014   CHOL 168 08/14/2014   TRIG 61.0 08/14/2014   HDL 76.30 08/14/2014   ALT 15 08/14/2014   AST 22 08/14/2014  NA 142 08/14/2014   K 4.3 08/14/2014   CL 104 08/14/2014   CREATININE 0.75 08/14/2014   BUN 12 08/14/2014   CO2 33* 08/14/2014   TSH 4.18  08/14/2014     Assessment & Plan:

## 2014-08-28 NOTE — Patient Instructions (Signed)
Preventive Care for Adults A healthy lifestyle and preventive care can promote health and wellness. Preventive health guidelines for women include the following key practices.  A routine yearly physical is a good way to check with your health care provider about your health and preventive screening. It is a chance to share any concerns and updates on your health and to receive a thorough exam.  Visit your dentist for a routine exam and preventive care every 6 months. Brush your teeth twice a day and floss once a day. Good oral hygiene prevents tooth decay and gum disease.  The frequency of eye exams is based on your age, health, family medical history, use of contact lenses, and other factors. Follow your health care provider's recommendations for frequency of eye exams.  Eat a healthy diet. Foods like vegetables, fruits, whole grains, low-fat dairy products, and lean protein foods contain the nutrients you need without too many calories. Decrease your intake of foods high in solid fats, added sugars, and salt. Eat the right amount of calories for you.Get information about a proper diet from your health care provider, if necessary.  Regular physical exercise is one of the most important things you can do for your health. Most adults should get at least 150 minutes of moderate-intensity exercise (any activity that increases your heart rate and causes you to sweat) each week. In addition, most adults need muscle-strengthening exercises on 2 or more days a week.  Maintain a healthy weight. The body mass index (BMI) is a screening tool to identify possible weight problems. It provides an estimate of body fat based on height and weight. Your health care provider can find your BMI and can help you achieve or maintain a healthy weight.For adults 20 years and older:  A BMI below 18.5 is considered underweight.  A BMI of 18.5 to 24.9 is normal.  A BMI of 25 to 29.9 is considered overweight.  A BMI of  30 and above is considered obese.  Maintain normal blood lipids and cholesterol levels by exercising and minimizing your intake of saturated fat. Eat a balanced diet with plenty of fruit and vegetables. Blood tests for lipids and cholesterol should begin at age 76 and be repeated every 5 years. If your lipid or cholesterol levels are high, you are over 50, or you are at high risk for heart disease, you may need your cholesterol levels checked more frequently.Ongoing high lipid and cholesterol levels should be treated with medicines if diet and exercise are not working.  If you smoke, find out from your health care provider how to quit. If you do not use tobacco, do not start.  Lung cancer screening is recommended for adults aged 22-80 years who are at high risk for developing lung cancer because of a history of smoking. A yearly low-dose CT scan of the lungs is recommended for people who have at least a 30-pack-year history of smoking and are a current smoker or have quit within the past 15 years. A pack year of smoking is smoking an average of 1 pack of cigarettes a day for 1 year (for example: 1 pack a day for 30 years or 2 packs a day for 15 years). Yearly screening should continue until the smoker has stopped smoking for at least 15 years. Yearly screening should be stopped for people who develop a health problem that would prevent them from having lung cancer treatment.  If you are pregnant, do not drink alcohol. If you are breastfeeding,  be very cautious about drinking alcohol. If you are not pregnant and choose to drink alcohol, do not have more than 1 drink per day. One drink is considered to be 12 ounces (355 mL) of beer, 5 ounces (148 mL) of wine, or 1.5 ounces (44 mL) of liquor.  Avoid use of street drugs. Do not share needles with anyone. Ask for help if you need support or instructions about stopping the use of drugs.  High blood pressure causes heart disease and increases the risk of  stroke. Your blood pressure should be checked at least every 1 to 2 years. Ongoing high blood pressure should be treated with medicines if weight loss and exercise do not work.  If you are 80-37 years old, ask your health care provider if you should take aspirin to prevent strokes.  Diabetes screening involves taking a blood sample to check your fasting blood sugar level. This should be done once every 3 years, after age 38, if you are within normal weight and without risk factors for diabetes. Testing should be considered at a younger age or be carried out more frequently if you are overweight and have at least 1 risk factor for diabetes.  Breast cancer screening is essential preventive care for women. You should practice "breast self-awareness." This means understanding the normal appearance and feel of your breasts and may include breast self-examination. Any changes detected, no matter how small, should be reported to a health care provider. Women in their 33s and 30s should have a clinical breast exam (CBE) by a health care provider as part of a regular health exam every 1 to 3 years. After age 73, women should have a CBE every year. Starting at age 20, women should consider having a mammogram (breast X-ray test) every year. Women who have a family history of breast cancer should talk to their health care provider about genetic screening. Women at a high risk of breast cancer should talk to their health care providers about having an MRI and a mammogram every year.  Breast cancer gene (BRCA)-related cancer risk assessment is recommended for women who have family members with BRCA-related cancers. BRCA-related cancers include breast, ovarian, tubal, and peritoneal cancers. Having family members with these cancers may be associated with an increased risk for harmful changes (mutations) in the breast cancer genes BRCA1 and BRCA2. Results of the assessment will determine the need for genetic counseling and  BRCA1 and BRCA2 testing.  Routine pelvic exams to screen for cancer are no longer recommended for nonpregnant women who are considered low risk for cancer of the pelvic organs (ovaries, uterus, and vagina) and who do not have symptoms. Ask your health care provider if a screening pelvic exam is right for you.  If you have had past treatment for cervical cancer or a condition that could lead to cancer, you need Pap tests and screening for cancer for at least 20 years after your treatment. If Pap tests have been discontinued, your risk factors (such as having a new sexual partner) need to be reassessed to determine if screening should be resumed. Some women have medical problems that increase the chance of getting cervical cancer. In these cases, your health care provider may recommend more frequent screening and Pap tests.  The HPV test is an additional test that may be used for cervical cancer screening. The HPV test looks for the virus that can cause the cell changes on the cervix. The cells collected during the Pap test can be  tested for HPV. The HPV test could be used to screen women aged 30 years and older, and should be used in women of any age who have unclear Pap test results. After the age of 30, women should have HPV testing at the same frequency as a Pap test.  Colorectal cancer can be detected and often prevented. Most routine colorectal cancer screening begins at the age of 50 years and continues through age 75 years. However, your health care provider may recommend screening at an earlier age if you have risk factors for colon cancer. On a yearly basis, your health care provider may provide home test kits to check for hidden blood in the stool. Use of a small camera at the end of a tube, to directly examine the colon (sigmoidoscopy or colonoscopy), can detect the earliest forms of colorectal cancer. Talk to your health care provider about this at age 50, when routine screening begins. Direct  exam of the colon should be repeated every 5-10 years through age 75 years, unless early forms of pre-cancerous polyps or small growths are found.  People who are at an increased risk for hepatitis B should be screened for this virus. You are considered at high risk for hepatitis B if:  You were born in a country where hepatitis B occurs often. Talk with your health care provider about which countries are considered high risk.  Your parents were born in a high-risk country and you have not received a shot to protect against hepatitis B (hepatitis B vaccine).  You have HIV or AIDS.  You use needles to inject street drugs.  You live with, or have sex with, someone who has hepatitis B.  You get hemodialysis treatment.  You take certain medicines for conditions like cancer, organ transplantation, and autoimmune conditions.  Hepatitis C blood testing is recommended for all people born from 1945 through 1965 and any individual with known risks for hepatitis C.  Practice safe sex. Use condoms and avoid high-risk sexual practices to reduce the spread of sexually transmitted infections (STIs). STIs include gonorrhea, chlamydia, syphilis, trichomonas, herpes, HPV, and human immunodeficiency virus (HIV). Herpes, HIV, and HPV are viral illnesses that have no cure. They can result in disability, cancer, and death.  You should be screened for sexually transmitted illnesses (STIs) including gonorrhea and chlamydia if:  You are sexually active and are younger than 24 years.  You are older than 24 years and your health care provider tells you that you are at risk for this type of infection.  Your sexual activity has changed since you were last screened and you are at an increased risk for chlamydia or gonorrhea. Ask your health care provider if you are at risk.  If you are at risk of being infected with HIV, it is recommended that you take a prescription medicine daily to prevent HIV infection. This is  called preexposure prophylaxis (PrEP). You are considered at risk if:  You are a heterosexual woman, are sexually active, and are at increased risk for HIV infection.  You take drugs by injection.  You are sexually active with a partner who has HIV.  Talk with your health care provider about whether you are at high risk of being infected with HIV. If you choose to begin PrEP, you should first be tested for HIV. You should then be tested every 3 months for as long as you are taking PrEP.  Osteoporosis is a disease in which the bones lose minerals and strength   with aging. This can result in serious bone fractures or breaks. The risk of osteoporosis can be identified using a bone density scan. Women ages 59 years and over and women at risk for fractures or osteoporosis should discuss screening with their health care providers. Ask your health care provider whether you should take a calcium supplement or vitamin D to reduce the rate of osteoporosis.  Menopause can be associated with physical symptoms and risks. Hormone replacement therapy is available to decrease symptoms and risks. You should talk to your health care provider about whether hormone replacement therapy is right for you.  Use sunscreen. Apply sunscreen liberally and repeatedly throughout the day. You should seek shade when your shadow is shorter than you. Protect yourself by wearing long sleeves, pants, a wide-brimmed hat, and sunglasses year round, whenever you are outdoors.  Once a month, do a whole body skin exam, using a mirror to look at the skin on your back. Tell your health care provider of new moles, moles that have irregular borders, moles that are larger than a pencil eraser, or moles that have changed in shape or color.  Stay current with required vaccines (immunizations).  Influenza vaccine. All adults should be immunized every year.  Tetanus, diphtheria, and acellular pertussis (Td, Tdap) vaccine. Pregnant women should  receive 1 dose of Tdap vaccine during each pregnancy. The dose should be obtained regardless of the length of time since the last dose. Immunization is preferred during the 27th-36th week of gestation. An adult who has not previously received Tdap or who does not know her vaccine status should receive 1 dose of Tdap. This initial dose should be followed by tetanus and diphtheria toxoids (Td) booster doses every 10 years. Adults with an unknown or incomplete history of completing a 3-dose immunization series with Td-containing vaccines should begin or complete a primary immunization series including a Tdap dose. Adults should receive a Td booster every 10 years.  Varicella vaccine. An adult without evidence of immunity to varicella should receive 2 doses or a second dose if she has previously received 1 dose. Pregnant females who do not have evidence of immunity should receive the first dose after pregnancy. This first dose should be obtained before leaving the health care facility. The second dose should be obtained 4-8 weeks after the first dose.  Human papillomavirus (HPV) vaccine. Females aged 13-26 years who have not received the vaccine previously should obtain the 3-dose series. The vaccine is not recommended for use in pregnant females. However, pregnancy testing is not needed before receiving a dose. If a female is found to be pregnant after receiving a dose, no treatment is needed. In that case, the remaining doses should be delayed until after the pregnancy. Immunization is recommended for any person with an immunocompromised condition through the age of 82 years if she did not get any or all doses earlier. During the 3-dose series, the second dose should be obtained 4-8 weeks after the first dose. The third dose should be obtained 24 weeks after the first dose and 16 weeks after the second dose.  Zoster vaccine. One dose is recommended for adults aged 45 years or older unless certain conditions are  present.  Measles, mumps, and rubella (MMR) vaccine. Adults born before [birth year] generally are considered immune to measles and mumps. Adults born in [birth year] or later should have 1 or more doses of MMR vaccine unless there is a contraindication to the vaccine or there is laboratory evidence of immunity to  each of the three diseases. A routine second dose of MMR vaccine should be obtained at least 28 days after the first dose for students attending postsecondary schools, health care workers, or international travelers. People who received inactivated measles vaccine or an unknown type of measles vaccine during 1963-1967 should receive 2 doses of MMR vaccine. People who received inactivated mumps vaccine or an unknown type of mumps vaccine before 1979 and are at high risk for mumps infection should consider immunization with 2 doses of MMR vaccine. For females of childbearing age, rubella immunity should be determined. If there is no evidence of immunity, females who are not pregnant should be vaccinated. If there is no evidence of immunity, females who are pregnant should delay immunization until after pregnancy. Unvaccinated health care workers born before 1957 who lack laboratory evidence of measles, mumps, or rubella immunity or laboratory confirmation of disease should consider measles and mumps immunization with 2 doses of MMR vaccine or rubella immunization with 1 dose of MMR vaccine.  Pneumococcal 13-valent conjugate (PCV13) vaccine. When indicated, a person who is uncertain of her immunization history and has no record of immunization should receive the PCV13 vaccine. An adult aged 19 years or older who has certain medical conditions and has not been previously immunized should receive 1 dose of PCV13 vaccine. This PCV13 should be followed with a dose of pneumococcal polysaccharide (PPSV23) vaccine. The PPSV23 vaccine dose should be obtained at least 8 weeks after the dose of PCV13 vaccine. An adult aged 19  years or older who has certain medical conditions and previously received 1 or more doses of PPSV23 vaccine should receive 1 dose of PCV13. The PCV13 vaccine dose should be obtained 1 or more years after the last PPSV23 vaccine dose.  Pneumococcal polysaccharide (PPSV23) vaccine. When PCV13 is also indicated, PCV13 should be obtained first. All adults aged 65 years and older should be immunized. An adult younger than age 65 years who has certain medical conditions should be immunized. Any person who resides in a nursing home or long-term care facility should be immunized. An adult smoker should be immunized. People with an immunocompromised condition and certain other conditions should receive both PCV13 and PPSV23 vaccines. People with human immunodeficiency virus (HIV) infection should be immunized as soon as possible after diagnosis. Immunization during chemotherapy or radiation therapy should be avoided. Routine use of PPSV23 vaccine is not recommended for American Indians, [place] Natives, or people younger than 65 years unless there are medical conditions that require PPSV23 vaccine. When indicated, people who have unknown immunization and have no record of immunization should receive PPSV23 vaccine. One-time revaccination 5 years after the first dose of PPSV23 is recommended for people aged 19-64 years who have chronic kidney failure, nephrotic syndrome, asplenia, or immunocompromised conditions. People who received 1-2 doses of PPSV23 before age 65 years should receive another dose of PPSV23 vaccine at age 65 years or later if at least 5 years have passed since the previous dose. Doses of PPSV23 are not needed for people immunized with PPSV23 at or after age 65 years.  Meningococcal vaccine. Adults with asplenia or persistent complement component deficiencies should receive 2 doses of quadrivalent meningococcal conjugate (MenACWY-D) vaccine. The doses should be obtained at least 2 months apart.  Microbiologists working with certain meningococcal bacteria, military recruits, people at risk during an outbreak, and people who travel to or live in countries with a high rate of meningitis should be immunized. A first-year college student up through age   21 years who is living in a residence hall should receive a dose if she did not receive a dose on or after her 16th birthday. Adults who have certain high-risk conditions should receive one or more doses of vaccine.  Hepatitis A vaccine. Adults who wish to be protected from this disease, have certain high-risk conditions, work with hepatitis A-infected animals, work in hepatitis A research labs, or travel to or work in countries with a high rate of hepatitis A should be immunized. Adults who were previously unvaccinated and who anticipate close contact with an international adoptee during the first 60 days after arrival in the [place] from a country with a high rate of hepatitis A should be immunized.  Hepatitis B vaccine. Adults who wish to be protected from this disease, have certain high-risk conditions, may be exposed to blood or other infectious body fluids, are household contacts or sex partners of hepatitis B positive people, are clients or workers in certain care facilities, or travel to or work in countries with a high rate of hepatitis B should be immunized.  Haemophilus influenzae type b (Hib) vaccine. A previously unvaccinated person with asplenia or sickle cell disease or having a scheduled splenectomy should receive 1 dose of Hib vaccine. Regardless of previous immunization, a recipient of a hematopoietic stem cell transplant should receive a 3-dose series 6-12 months after her successful transplant. Hib vaccine is not recommended for adults with HIV infection. Preventive Services / Frequency Ages 64 to 68 years  Blood pressure check.** / Every 1 to 2 years.  Lipid and cholesterol check.** / Every 5 years beginning at age  22.  Clinical breast exam.** / Every 3 years for women in their 88s and 53s.  BRCA-related cancer risk assessment.** / For women who have family members with a BRCA-related cancer (breast, ovarian, tubal, or peritoneal cancers).  Pap test.** / Every 2 years from ages 90 through 51. Every 3 years starting at age 21 through age 56 or 3 with a history of 3 consecutive normal Pap tests.  HPV screening.** / Every 3 years from ages 24 through ages 1 to 46 with a history of 3 consecutive normal Pap tests.  Hepatitis C blood test.** / For any individual with known risks for hepatitis C.  Skin self-exam. / Monthly.  Influenza vaccine. / Every year.  Tetanus, diphtheria, and acellular pertussis (Tdap, Td) vaccine.** / Consult your health care provider. Pregnant women should receive 1 dose of Tdap vaccine during each pregnancy. 1 dose of Td every 10 years.  Varicella vaccine.** / Consult your health care provider. Pregnant females who do not have evidence of immunity should receive the first dose after pregnancy.  HPV vaccine. / 3 doses over 6 months, if 72 and younger. The vaccine is not recommended for use in pregnant females. However, pregnancy testing is not needed before receiving a dose.  Measles, mumps, rubella (MMR) vaccine.** / You need at least 1 dose of MMR if you were born in 1957 or later. You may also need a 2nd dose. For females of childbearing age, rubella immunity should be determined. If there is no evidence of immunity, females who are not pregnant should be vaccinated. If there is no evidence of immunity, females who are pregnant should delay immunization until after pregnancy.  Pneumococcal 13-valent conjugate (PCV13) vaccine.** / Consult your health care provider.  Pneumococcal polysaccharide (PPSV23) vaccine.** / 1 to 2 doses if you smoke cigarettes or if you have certain conditions.  Meningococcal vaccine.** /  1 dose if you are age 74 to 32 years and a [place] living in a residence hall, or have one of several medical conditions, you need to get vaccinated against meningococcal disease. You may also need additional booster doses.  Hepatitis A vaccine.** / Consult your health care provider.  Hepatitis B vaccine.** / Consult your health care provider.  Haemophilus influenzae type b (Hib) vaccine.** / Consult your health care provider. Ages 58 to 34 years  Blood pressure check.** / Every 1 to 2 years.  Lipid and cholesterol check.** / Every 5 years beginning at age 38 years.  Lung cancer screening. / Every year if you are aged 66-80 years and have a 30-pack-year history of smoking and currently smoke or have quit within the past 15 years. Yearly screening is stopped once you have quit smoking for at least 15 years or develop a health problem that would prevent you from having lung cancer treatment.  Clinical breast exam.** / Every year after age 34 years.  BRCA-related cancer risk assessment.** / For women who have family members with a BRCA-related cancer (breast, ovarian, tubal, or peritoneal cancers).  Mammogram.** / Every year beginning at age 44 years and continuing for as long as you are in good health. Consult with your health care provider.  Pap test.** / Every 3 years starting at age 18 years through age 16 or 48 years with a history of 3 consecutive normal Pap tests.  HPV screening.** / Every 3 years from ages 77 years through ages 65 to 33 years with a history of 3 consecutive normal Pap tests.  Fecal occult blood test (FOBT) of stool. / Every year beginning at age 64 years and continuing until age 67 years. You may not need to do this test if you get a colonoscopy every 10 years.  Flexible sigmoidoscopy or colonoscopy.** / Every 5 years for a flexible sigmoidoscopy or every 10 years for a colonoscopy beginning at age 41 years and continuing until age 63 years.  Hepatitis C blood test.** / For all people born from [birth year] through  1965 and any individual with known risks for hepatitis C.  Skin self-exam. / Monthly.  Influenza vaccine. / Every year.  Tetanus, diphtheria, and acellular pertussis (Tdap/Td) vaccine.** / Consult your health care provider. Pregnant women should receive 1 dose of Tdap vaccine during each pregnancy. 1 dose of Td every 10 years.  Varicella vaccine.** / Consult your health care provider. Pregnant females who do not have evidence of immunity should receive the first dose after pregnancy.  Zoster vaccine.** / 1 dose for adults aged 75 years or older.  Measles, mumps, rubella (MMR) vaccine.** / You need at least 1 dose of MMR if you were born in 1957 or later. You may also need a 2nd dose. For females of childbearing age, rubella immunity should be determined. If there is no evidence of immunity, females who are not pregnant should be vaccinated. If there is no evidence of immunity, females who are pregnant should delay immunization until after pregnancy.  Pneumococcal 13-valent conjugate (PCV13) vaccine.** / Consult your health care provider.  Pneumococcal polysaccharide (PPSV23) vaccine.** / 1 to 2 doses if you smoke cigarettes or if you have certain conditions.  Meningococcal vaccine.** / Consult your health care provider.  Hepatitis A vaccine.** / Consult your health care provider.  Hepatitis B vaccine.** / Consult your health care provider.  Haemophilus influenzae type b (Hib) vaccine.** / Consult your health care provider. Ages 34  years and over  Blood pressure check.** / Every 1 to 2 years.  Lipid and cholesterol check.** / Every 5 years beginning at age 22 years.  Lung cancer screening. / Every year if you are aged 73-80 years and have a 30-pack-year history of smoking and currently smoke or have quit within the past 15 years. Yearly screening is stopped once you have quit smoking for at least 15 years or develop a health problem that would prevent you from having lung cancer  treatment.  Clinical breast exam.** / Every year after age 4 years.  BRCA-related cancer risk assessment.** / For women who have family members with a BRCA-related cancer (breast, ovarian, tubal, or peritoneal cancers).  Mammogram.** / Every year beginning at age 40 years and continuing for as long as you are in good health. Consult with your health care provider.  Pap test.** / Every 3 years starting at age 9 years through age 34 or 91 years with 3 consecutive normal Pap tests. Testing can be stopped between 65 and 70 years with 3 consecutive normal Pap tests and no abnormal Pap or HPV tests in the past 10 years.  HPV screening.** / Every 3 years from ages 57 years through ages 64 or 45 years with a history of 3 consecutive normal Pap tests. Testing can be stopped between 65 and 70 years with 3 consecutive normal Pap tests and no abnormal Pap or HPV tests in the past 10 years.  Fecal occult blood test (FOBT) of stool. / Every year beginning at age 15 years and continuing until age 17 years. You may not need to do this test if you get a colonoscopy every 10 years.  Flexible sigmoidoscopy or colonoscopy.** / Every 5 years for a flexible sigmoidoscopy or every 10 years for a colonoscopy beginning at age 86 years and continuing until age 71 years.  Hepatitis C blood test.** / For all people born from [birth year] through 1965 and any individual with known risks for hepatitis C.  Osteoporosis screening.** / A one-time screening for women ages 83 years and over and women at risk for fractures or osteoporosis.  Skin self-exam. / Monthly.  Influenza vaccine. / Every year.  Tetanus, diphtheria, and acellular pertussis (Tdap/Td) vaccine.** / 1 dose of Td every 10 years.  Varicella vaccine.** / Consult your health care provider.  Zoster vaccine.** / 1 dose for adults aged 61 years or older.  Pneumococcal 13-valent conjugate (PCV13) vaccine.** / Consult your health care provider.  Pneumococcal  polysaccharide (PPSV23) vaccine.** / 1 dose for all adults aged 28 years and older.  Meningococcal vaccine.** / Consult your health care provider.  Hepatitis A vaccine.** / Consult your health care provider.  Hepatitis B vaccine.** / Consult your health care provider.  Haemophilus influenzae type b (Hib) vaccine.** / Consult your health care provider. ** Family history and personal history of risk and conditions may change your health care provider's recommendations. Document Released: 05/04/2001 Document Revised: 07/23/2013 Document Reviewed: 08/03/2010 [facility] Patient Information 2015 [place], [place]. This information is not intended to replace advice given to you by your health care provider. Make sure you discuss any questions you have with your health care provider.

## 2014-09-17 LAB — HM MAMMOGRAPHY

## 2014-09-19 ENCOUNTER — Encounter: Payer: Self-pay | Admitting: Internal Medicine

## 2014-09-25 ENCOUNTER — Telehealth: Payer: Self-pay

## 2014-09-25 NOTE — Telephone Encounter (Signed)
Bone density results mailed 

## 2014-09-30 NOTE — Telephone Encounter (Signed)
Pls see my note attached to BDS report Thx

## 2014-09-30 NOTE — Telephone Encounter (Signed)
Pt called in and would like to talk to nurse about there bone Density results

## 2014-10-31 ENCOUNTER — Ambulatory Visit (INDEPENDENT_AMBULATORY_CARE_PROVIDER_SITE_OTHER): Payer: Medicare Other | Admitting: Gynecology

## 2014-10-31 ENCOUNTER — Encounter: Payer: Self-pay | Admitting: Gynecology

## 2014-10-31 VITALS — BP 132/70

## 2014-10-31 DIAGNOSIS — M810 Age-related osteoporosis without current pathological fracture: Secondary | ICD-10-CM | POA: Diagnosis not present

## 2014-10-31 LAB — CREATININE, SERUM: Creat: 0.74 mg/dL (ref 0.60–0.93)

## 2014-10-31 LAB — BUN: BUN: 15 mg/dL (ref 7–25)
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
## 2014-10-31 NOTE — Patient Instructions (Signed)
Teriparatide injection What is this medicine? TERIPARATIDE (terr ih PAR a tyd) increases bone mass and strength. It helps make healthy bone and to slow bone loss. This medicine is used to prevent bone fractures. This medicine may be used for other purposes; ask your health care provider or pharmacist if you have questions. COMMON BRAND NAME(S): Forteo What should I tell my health care provider before I take this medicine? They need to know if you have any of these conditions: -bone disease other than osteoporosis -heart, kidney or liver disease -history of cancer in the bone -kidney stone -Paget's disease -parathyroid disease -receiving radiation therapy -an unusual or allergic reaction to teriparatide, other medicines, foods, dyes, or preservatives -pregnant or trying to get pregnant -breast-feeding How should I use this medicine? This medicine comes in an injection pen device. This pen injects the medicine under your skin. Follow the directions on the prescription label. You will be taught how to use this medicine. Take your medicine at regular intervals. Do not take your medicine more often than directed. Do not use this medicine if it has solid particles in it, or if it is cloudy or colored. It should be clear and colorless. Be sure that you are using your pen device correctly. A special MedGuide will be given to you by the pharmacist with each prescription and refill. Be sure to read this information carefully each time. Talk to your pediatrician regarding the use of this medicine in children. Special care may be needed. Overdosage: If you think you have taken too much of this medicine contact a poison control center or emergency room at once. NOTE: This medicine is only for you. Do not share this medicine with others. What if I miss a dose? If you miss a dose, take it as soon as you can. If it is almost time for your next dose, take only that dose. Do not take double or extra  doses. What may interact with this medicine? -digoxin -other medicines to strengthen bone This list may not describe all possible interactions. Give your health care provider a list of all the medicines, herbs, non-prescription drugs, or dietary supplements you use. Also tell them if you smoke, drink alcohol, or use illegal drugs. Some items may interact with your medicine. What should I watch for while using this medicine? Visit your doctor or health care professional for regular checks on your progress. Your doctor may order blood tests and other tests to see how you are doing. You should make sure you get enough calcium and vitamin D while you are taking this medicine, unless your doctor tells you not to. Discuss the foods you eat and the vitamins you take with your health care professional. Dennis Bast may get drowsy or dizzy. Do not drive, use machinery, or do anything that needs mental alertness until you know how this medicine affects you. Do not stand or sit up quickly, especially if you are an older patient. This reduces the risk of dizzy or fainting spells. What side effects may I notice from receiving this medicine? Side effects that you should report to your doctor or health care professional as soon as possible: -allergic reactions like skin rash, itching or hives, swelling of the face, lips, or tongue -chronic constipation -high blood pressure -irregular heartbeat, chest pain -nausea, vomiting -unusually weak or tired Side effects that usually do not require medical attention (report to your doctor or health care professional if they continue or are bothersome): -bone pain -cough, runny nose -  headache -leg cramps -muscle spasms in the back or legs -pain, redness, irritation or swelling at the injection site -stomach upset -trouble sleeping This list may not describe all possible side effects. Call your doctor for medical advice about side effects. You may report side effects to FDA at  1-800-FDA-1088. Where should I keep my medicine? Keep out of the reach of children. Store in a refrigerator between 2 and 8 degrees C (36 and 46 degrees F). Do not freeze. Recap the pen injector when not in use to protect it from light and damage. Use quickly after taking out of the refrigerator and return to refrigerator right after using. Throw away any unused medicine 28 days after the first injection from the device. Do not use after the expiration date printed on the pen and pen packaging. NOTE: This sheet is a summary. It may not cover all possible information. If you have questions about this medicine, talk to your doctor, pharmacist, or health care provider.  2015, Elsevier/Gold Standard. (2008-03-05 17:45:37) Osteoporosis Throughout your life, your body breaks down old bone and replaces it with new bone. As you get older, your body does not replace bone as quickly as it breaks it down. By the age of 52 years, most people begin to gradually lose bone because of the imbalance between bone loss and replacement. Some people lose more bone than others. Bone loss beyond a specified normal degree is considered osteoporosis.  Osteoporosis affects the strength and durability of your bones. The inside of the ends of your bones and your flat bones, like the bones of your pelvis, look like honeycomb, filled with tiny open spaces. As bone loss occurs, your bones become less dense. This means that the open spaces inside your bones become bigger and the walls between these spaces become thinner. This makes your bones weaker. Bones of a person with osteoporosis can become so weak that they can break (fracture) during minor accidents, such as a simple fall. CAUSES  The following factors have been associated with the development of osteoporosis:  Smoking.  Drinking more than 2 alcoholic drinks several days per week.  Long-term use of certain medicines:  Corticosteroids.  Chemotherapy  medicines.  Thyroid medicines.  Antiepileptic medicines.  Gonadal hormone suppression medicine.  Immunosuppression medicine.  Being underweight.  Lack of physical activity.  Lack of exposure to the sun. This can lead to vitamin D deficiency.  Certain medical conditions:  Certain inflammatory bowel diseases, such as Crohn disease and ulcerative colitis.  Diabetes.  Hyperthyroidism.  Hyperparathyroidism. RISK FACTORS Anyone can develop osteoporosis. However, the following factors can increase your risk of developing osteoporosis:  Gender--Women are at higher risk than men.  Age--Being older than 50 years increases your risk.  Ethnicity--White and Asian people have an increased risk.  Weight --Being extremely underweight can increase your risk of osteoporosis.  Family history of osteoporosis--Having a family member who has developed osteoporosis can increase your risk. SYMPTOMS  Usually, people with osteoporosis have no symptoms.  DIAGNOSIS  Signs during a physical exam that may prompt your caregiver to suspect osteoporosis include:  Decreased height. This is usually caused by the compression of the bones that form your spine (vertebrae) because they have weakened and become fractured.  A curving or rounding of the upper back (kyphosis). To confirm signs of osteoporosis, your caregiver may request a procedure that uses 2 low-dose X-ray beams with different levels of energy to measure your bone mineral density (dual-energy X-ray absorptiometry [DXA]). Also, your  caregiver may check your level of vitamin D. TREATMENT  The goal of osteoporosis treatment is to strengthen bones in order to decrease the risk of bone fractures. There are different types of medicines available to help achieve this goal. Some of these medicines work by slowing the processes of bone loss. Some medicines work by increasing bone density. Treatment also involves making sure that your levels of calcium  and vitamin D are adequate. PREVENTION  There are things you can do to help prevent osteoporosis. Adequate intake of calcium and vitamin D can help you achieve optimal bone mineral density. Regular exercise can also help, especially resistance and weight-bearing activities. If you smoke, quitting smoking is an important part of osteoporosis prevention. MAKE SURE YOU:  Understand these instructions.  Will watch your condition.  Will get help right away if you are not doing well or get worse. FOR MORE INFORMATION www.osteo.org and EquipmentWeekly.com.ee Document Released: 12/16/2004 Document Revised: 07/03/2012 Document Reviewed: 02/20/2011 [facility] Patient Information 2015 [place], [place]. This information is not intended to replace advice given to you by your health care provider. Make sure you discuss any questions you have with your health care provider.

## 2014-11-01 LAB — PTH, INTACT AND CALCIUM
CALCIUM: 10 mg/dL (ref 8.4–10.5)
PTH: 13 pg/mL — ABNORMAL LOW (ref 14–64)

## 2014-11-01 LAB — VITAMIN D 25 HYDROXY (VIT D DEFICIENCY, FRACTURES): VIT D 25 HYDROXY: 38 ng/mL (ref 30–100)

## 2014-11-05 ENCOUNTER — Telehealth: Payer: Self-pay | Admitting: Gynecology

## 2014-11-05 DIAGNOSIS — M810 Age-related osteoporosis without current pathological fracture: Secondary | ICD-10-CM

## 2014-11-05 NOTE — Telephone Encounter (Addendum)
Joy Thompson, please check insurance coverage for this patient for Forteo and then help her order and teach her how to [employer]. She has osteoporosis. I have ordered Calcium, vitamin D, PTH,BU, Creat and 24 hour urine calcium  Note from Dr Toney Rakes.   BUN 15   Calcium 10.0  Creatinine 0.74 Talked with pt and explained that Forteo will be submitted to her pharmacy ([facility]). She is instructed to call me when she receives call and to come in for administration instructions when medication arrives. Talked with lab 24 hr bags are here.Patient will have labs first prior to [facility] and ok given to start medication after pt vacation.

## 2014-11-07 ENCOUNTER — Other Ambulatory Visit: Payer: Medicare Other

## 2014-11-07 DIAGNOSIS — M810 Age-related osteoporosis without current pathological fracture: Secondary | ICD-10-CM

## 2014-11-07 LAB — CALCIUM, URINE, 24 HOUR
Calcium, 24 hour urine: 78 mg/d — ABNORMAL LOW (ref 100–250)
Calcium, Ur: 3 mg/dL

## 2014-11-12 NOTE — Telephone Encounter (Signed)
Dr Toney Rakes 24 hour Urine result  WNL per your note. May I call in [facility] for patient.

## 2014-11-12 NOTE — Telephone Encounter (Signed)
Yes, thank you.

## 2014-11-22 ENCOUNTER — Encounter: Payer: Self-pay | Admitting: Internal Medicine

## 2014-11-22 ENCOUNTER — Ambulatory Visit (INDEPENDENT_AMBULATORY_CARE_PROVIDER_SITE_OTHER): Payer: Medicare Other | Admitting: Internal Medicine

## 2014-11-22 VITALS — BP 158/100 | HR 89 | Temp 99.2°F | Wt 102.0 lb

## 2014-11-22 DIAGNOSIS — J040 Acute laryngitis: Secondary | ICD-10-CM | POA: Insufficient documentation

## 2014-11-22 DIAGNOSIS — M810 Age-related osteoporosis without current pathological fracture: Secondary | ICD-10-CM | POA: Diagnosis not present
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
## 2014-11-22 NOTE — Patient Instructions (Signed)
Voice rest Mucinex  

## 2014-11-22 NOTE — Progress Notes (Signed)
Pre visit review using our clinic review tool, if applicable. No additional management support is needed unless otherwise documented below in the visit note. 

## 2014-11-22 NOTE — Assessment & Plan Note (Signed)
8/16 Dr Toney Rakes is planning to start Forteo inj

## 2014-11-22 NOTE — Assessment & Plan Note (Signed)
8/16 viral Mucinex prn

## 2014-11-26 MED ORDER — TERIPARATIDE (RECOMBINANT) 600 MCG/2.4ML ~~LOC~~ SOLN: 20.0000 ug | SUBCUTANEOUS | Status: DC

## 2014-11-26 MED ORDER — INSULIN PEN NEEDLE 31G X 5 MM MISC: Status: DC

## 2014-11-26 NOTE — Telephone Encounter (Signed)
Order sent to patients pharmacy for Joy Thompson. She will be returning from vacation and will decide regarding cost of medication. She will call with her decision.

## 2014-11-29 NOTE — Telephone Encounter (Signed)
Forteo Prior Authorization obtained and approved by [employer].  [id number] from 10/30/14-11/28/2016.  Pharmacy , [facility], informed and they will rerun prescription for Forteo and call patient with cost. Patient informed and will decide if she wants and let us know to proceed or give alternative. KW CMA

## 2014-12-03 NOTE — Telephone Encounter (Addendum)
Patient called regarding Forteo. She will call [facility] to check on cost for medication and let me know her decision today. Medication $65 month with needles $40 with $105 per month. She wants to know if other medications are available, she has decided that she prefers not to give herself a shot each day and also very concerned regarding the cost of the medication. Please advise

## 2014-12-03 NOTE — Telephone Encounter (Signed)
You can check if Prolia is covered and less out of pocket?

## 2014-12-10 ENCOUNTER — Encounter: Payer: Self-pay | Admitting: Gynecology

## 2014-12-10 NOTE — Telephone Encounter (Signed)
Benefits for Prolia are Deductible $290 (met), Co-pay 20% (approx $200) with or without OV, OOP MAX $3290 ($301 met). No PA , Patient is going to dentist on 12/16/14 for check up will discuss with dentist Prolia injection. No current dental work. Patient would like to come in for OV and receive injection. Will ask Claudia to call. She would like 12/24/14.  

## 2014-12-25 ENCOUNTER — Encounter: Payer: Self-pay | Admitting: Gynecology

## 2014-12-25 ENCOUNTER — Ambulatory Visit (INDEPENDENT_AMBULATORY_CARE_PROVIDER_SITE_OTHER): Payer: Medicare Other | Admitting: Gynecology

## 2014-12-25 VITALS — BP 126/74

## 2014-12-25 DIAGNOSIS — N951 Menopausal and female climacteric states: Secondary | ICD-10-CM

## 2014-12-25 DIAGNOSIS — R6889 Other general symptoms and signs: Secondary | ICD-10-CM

## 2014-12-25 DIAGNOSIS — R630 Anorexia: Secondary | ICD-10-CM

## 2014-12-25 DIAGNOSIS — M810 Age-related osteoporosis without current pathological fracture: Secondary | ICD-10-CM

## 2014-12-25 MED ORDER — DENOSUMAB 60 MG/ML ~~LOC~~ SOLN
60.0000 mg | Freq: Once | SUBCUTANEOUS | Status: AC
  Administered 2014-12-25: 60 mg via SUBCUTANEOUS

## 2014-12-25 NOTE — Addendum Note (Signed)
Addended by: Thurnell Garbe A on: 12/25/2014 11:44 AM   Modules accepted: Orders

## 2014-12-25 NOTE — Progress Notes (Signed)
   Patient is a 74 year old who presented to the office today with her husband to discuss her treatment options for her osteoporosis. She was seen in the office on August 11 with the following summary noted:  Patient with past history of osteoporosis had been on Actonel for approximately 7 years and later her PCP switched her over to Evista which she stopped a week ago. Her bone density studies as follows: 2010 her lowest T score the AP spine of -2.8.  2016 AP spine -2.4 right femoral neck -2.7 left femoral neck -2.3 change in BMD in the past 6 years has been -10.5% of the AP spine and a +1% change in the right and left femoral neck respectively.  Patient has not been on any hormone replacement therapy. She is thin Caucasian with a low BMI of 19.30 kg/m (weight 103 pounds). She is a nonsmoker. She does not consume alcohol. Patient with no prior history of steroids usage.Based on these findings her bone masses 25% of low normal and her risk of hip fractures is 11 times greater then same age individuals and 8 times greater risk of spinal fracture.  We will see him discussed various treatment options. The concerns that I had was that when compared with 2010 bone density study her right femoral neck has gone worse from -2.62 to -2.7. We discussed the pros and cons of a drug holiday versus starting her on a monoclonal antibody such as Prolia because of her risk factors for fracture. She has elected to proceed with the Prolia. Her recent calcium, vitamin D and PTH and BUN and creatinine were all normal. A 24-hour urine collection did not demonstrate excessive excretion of calcium via the urinary tract. The risks benefits and pros and cons of the medication were discussed include osteonecrosis of the jaw as well as spontaneous subtrochanteric fracture. She will continue with her calcium and vitamin D and weightbearing exercises 2-3 times a week. Her first shot will be administered today the second will be in 6  months. Literature information was provided.    Greater than 50% of the time was spent counseling [place] care of this patient with osteoporosis and risk for fracture.

## 2014-12-25 NOTE — Patient Instructions (Signed)
Denosumab injection  What is this medicine?  DENOSUMAB (den oh sue mab) slows bone breakdown. Prolia is used to treat osteoporosis in women after menopause and in men. Xgeva is used to prevent bone fractures and other bone problems caused by cancer bone metastases. Xgeva is also used to treat giant cell tumor of the bone.  This medicine may be used for other purposes; ask your health care provider or pharmacist if you have questions.  What should I tell my health care provider before I take this medicine?  They need to know if you have any of these conditions:  -dental disease  -eczema  -infection or history of infections  -kidney disease or on dialysis  -low blood calcium or vitamin D  -malabsorption syndrome  -scheduled to have surgery or tooth extraction  -taking medicine that contains denosumab  -thyroid or parathyroid disease  -an unusual reaction to denosumab, other medicines, foods, dyes, or preservatives  -pregnant or trying to get pregnant  -breast-feeding  How should I use this medicine?  This medicine is for injection under the skin. It is given by a health care professional in a hospital or clinic setting.  If you are getting Prolia, a special MedGuide will be given to you by the pharmacist with each prescription and refill. Be sure to read this information carefully each time.  For Prolia, talk to your pediatrician regarding the use of this medicine in children. Special care may be needed. For Xgeva, talk to your pediatrician regarding the use of this medicine in children. While this drug may be prescribed for children as young as 13 years for selected conditions, precautions do apply.  Overdosage: If you think you have taken too much of this medicine contact a poison control center or emergency room at once.  NOTE: This medicine is only for you. Do not share this medicine with others.  What if I miss a dose?  It is important not to miss your dose. Call your doctor or health care professional if you are  unable to keep an appointment.  What may interact with this medicine?  Do not take this medicine with any of the following medications:  -other medicines containing denosumab  This medicine may also interact with the following medications:  -medicines that suppress the immune system  -medicines that treat cancer  -steroid medicines like prednisone or cortisone  This list may not describe all possible interactions. Give your health care provider a list of all the medicines, herbs, non-prescription drugs, or dietary supplements you use. Also tell them if you smoke, drink alcohol, or use illegal drugs. Some items may interact with your medicine.  What should I watch for while using this medicine?  Visit your doctor or health care professional for regular checks on your progress. Your doctor or health care professional may order blood tests and other tests to see how you are doing.  Call your doctor or health care professional if you get a cold or other infection while receiving this medicine. Do not treat yourself. This medicine may decrease your body's ability to fight infection.  You should make sure you get enough calcium and vitamin D while you are taking this medicine, unless your doctor tells you not to. Discuss the foods you eat and the vitamins you take with your health care professional.  See your dentist regularly. Brush and floss your teeth as directed. Before you have any dental work done, tell your dentist you are receiving this medicine.  Do   not become pregnant while taking this medicine or for 5 months after stopping it. Women should inform their doctor if they wish to become pregnant or think they might be pregnant. There is a potential for serious side effects to an unborn child. Talk to your health care professional or pharmacist for more information.  What side effects may I notice from receiving this medicine?  Side effects that you should report to your doctor or health care professional as soon as  possible:  -allergic reactions like skin rash, itching or hives, swelling of the face, lips, or tongue  -breathing problems  -chest pain  -fast, irregular heartbeat  -feeling faint or lightheaded, falls  -fever, chills, or any other sign of infection  -muscle spasms, tightening, or twitches  -numbness or tingling  -skin blisters or bumps, or is dry, peels, or red  -slow healing or unexplained pain in the mouth or jaw  -unusual bleeding or bruising  Side effects that usually do not require medical attention (Report these to your doctor or health care professional if they continue or are bothersome.):  -muscle pain  -stomach upset, gas  This list may not describe all possible side effects. Call your doctor for medical advice about side effects. You may report side effects to FDA at 1-800-FDA-1088.  Where should I keep my medicine?  This medicine is only given in a clinic, doctor's office, or other health care setting and will not be stored at home.  NOTE: This sheet is a summary. It may not cover all possible information. If you have questions about this medicine, talk to your doctor, pharmacist, or health care provider.      2016, [employer]. (2011-09-06 12:37:47)

## 2014-12-27 NOTE — Telephone Encounter (Signed)
Pt received injection 12/25/14 KW CMA

## 2015-02-05 ENCOUNTER — Telehealth: Payer: Self-pay | Admitting: *Deleted

## 2015-02-05 NOTE — Telephone Encounter (Signed)
Ferrous will be fine. The pharmacist she did have given her a list of medications to avoid.

## 2015-02-05 NOTE — Telephone Encounter (Signed)
Pt received her Prolia injection in Oct. Asked if okay to take OTC Mucinex for a cold? And what medications should she avoid with prolia? Please advise

## 2015-02-05 NOTE — Telephone Encounter (Signed)
Patient aware to okay to take Mucinex and will check with the pharmacist for medication to avoid.

## 2015-02-19 ENCOUNTER — Encounter: Payer: Medicare Other | Admitting: Gynecology

## 2015-02-27 ENCOUNTER — Encounter: Payer: Self-pay | Admitting: Internal Medicine

## 2015-02-27 ENCOUNTER — Ambulatory Visit (INDEPENDENT_AMBULATORY_CARE_PROVIDER_SITE_OTHER): Payer: Medicare Other | Admitting: Internal Medicine

## 2015-02-27 VITALS — BP 120/70 | HR 87 | Wt 102.0 lb

## 2015-02-27 DIAGNOSIS — E785 Hyperlipidemia, unspecified: Secondary | ICD-10-CM | POA: Diagnosis not present

## 2015-02-27 DIAGNOSIS — Z0000 Encounter for general adult medical examination without abnormal findings: Secondary | ICD-10-CM

## 2015-02-27 DIAGNOSIS — M810 Age-related osteoporosis without current pathological fracture: Secondary | ICD-10-CM

## 2015-02-27 NOTE — Patient Instructions (Signed)
Preventive Care for Adults, Female A healthy lifestyle and preventive care can promote health and wellness. Preventive health guidelines for women include the following key practices.  A routine yearly physical is a good way to check with your health care provider about your health and preventive screening. It is a chance to share any concerns and updates on your health and to receive a thorough exam.  Visit your dentist for a routine exam and preventive care every 6 months. Brush your teeth twice a day and floss once a day. Good oral hygiene prevents tooth decay and gum disease.  The frequency of eye exams is based on your age, health, family medical history, use of contact lenses, and other factors. Follow your health care provider's recommendations for frequency of eye exams.  Eat a healthy diet. Foods like vegetables, fruits, whole grains, low-fat dairy products, and lean protein foods contain the nutrients you need without too many calories. Decrease your intake of foods high in solid fats, added sugars, and salt. Eat the right amount of calories for you.Get information about a proper diet from your health care provider, if necessary.  Regular physical exercise is one of the most important things you can do for your health. Most adults should get at least 150 minutes of moderate-intensity exercise (any activity that increases your heart rate and causes you to sweat) each week. In addition, most adults need muscle-strengthening exercises on 2 or more days a week.  Maintain a healthy weight. The body mass index (BMI) is a screening tool to identify possible weight problems. It provides an estimate of body fat based on height and weight. Your health care provider can find your BMI and can help you achieve or maintain a healthy weight.For adults 20 years and older:  A BMI below 18.5 is considered underweight.  A BMI of 18.5 to 24.9 is normal.  A BMI of 25 to 29.9 is considered overweight.  A  BMI of 30 and above is considered obese.  Maintain normal blood lipids and cholesterol levels by exercising and minimizing your intake of saturated fat. Eat a balanced diet with plenty of fruit and vegetables. Blood tests for lipids and cholesterol should begin at age 45 and be repeated every 5 years. If your lipid or cholesterol levels are high, you are over 50, or you are at high risk for heart disease, you may need your cholesterol levels checked more frequently.Ongoing high lipid and cholesterol levels should be treated with medicines if diet and exercise are not working.  If you smoke, find out from your health care provider how to quit. If you do not use tobacco, do not start.  Lung cancer screening is recommended for adults aged 45-80 years who are at high risk for developing lung cancer because of a history of smoking. A yearly low-dose CT scan of the lungs is recommended for people who have at least a 30-pack-year history of smoking and are a current smoker or have quit within the past 15 years. A pack year of smoking is smoking an average of 1 pack of cigarettes a day for 1 year (for example: 1 pack a day for 30 years or 2 packs a day for 15 years). Yearly screening should continue until the smoker has stopped smoking for at least 15 years. Yearly screening should be stopped for people who develop a health problem that would prevent them from having lung cancer treatment.  If you are pregnant, do not drink alcohol. If you are  breastfeeding, be very cautious about drinking alcohol. If you are not pregnant and choose to drink alcohol, do not have more than 1 drink per day. One drink is considered to be 12 ounces (355 mL) of beer, 5 ounces (148 mL) of wine, or 1.5 ounces (44 mL) of liquor.  Avoid use of street drugs. Do not share needles with anyone. Ask for help if you need support or instructions about stopping the use of drugs.  High blood pressure causes heart disease and increases the risk  of stroke. Your blood pressure should be checked at least every 1 to 2 years. Ongoing high blood pressure should be treated with medicines if weight loss and exercise do not work.  If you are 55-79 years old, ask your health care provider if you should take aspirin to prevent strokes.  Diabetes screening is done by taking a blood sample to check your blood glucose level after you have not eaten for a certain period of time (fasting). If you are not overweight and you do not have risk factors for diabetes, you should be screened once every 3 years starting at age 45. If you are overweight or obese and you are 40-70 years of age, you should be screened for diabetes every year as part of your cardiovascular risk assessment.  Breast cancer screening is essential preventive care for women. You should practice "breast self-awareness." This means understanding the normal appearance and feel of your breasts and may include breast self-examination. Any changes detected, no matter how small, should be reported to a health care provider. Women in their 20s and 30s should have a clinical breast exam (CBE) by a health care provider as part of a regular health exam every 1 to 3 years. After age 40, women should have a CBE every year. Starting at age 40, women should consider having a mammogram (breast X-ray test) every year. Women who have a family history of breast cancer should talk to their health care provider about genetic screening. Women at a high risk of breast cancer should talk to their health care providers about having an MRI and a mammogram every year.  Breast cancer gene (BRCA)-related cancer risk assessment is recommended for women who have family members with BRCA-related cancers. BRCA-related cancers include breast, ovarian, tubal, and peritoneal cancers. Having family members with these cancers may be associated with an increased risk for harmful changes (mutations) in the breast cancer genes BRCA1 and  BRCA2. Results of the assessment will determine the need for genetic counseling and BRCA1 and BRCA2 testing.  Your health care provider may recommend that you be screened regularly for cancer of the pelvic organs (ovaries, uterus, and vagina). This screening involves a pelvic examination, including checking for microscopic changes to the surface of your cervix (Pap test). You may be encouraged to have this screening done every 3 years, beginning at age 21.  For women ages 30-65, health care providers may recommend pelvic exams and Pap testing every 3 years, or they may recommend the Pap and pelvic exam, combined with testing for human papilloma virus (HPV), every 5 years. Some types of HPV increase your risk of cervical cancer. Testing for HPV may also be done on women of any age with unclear Pap test results.  Other health care providers may not recommend any screening for nonpregnant women who are considered low risk for pelvic cancer and who do not have symptoms. Ask your health care provider if a screening pelvic exam is right for   you.  If you have had past treatment for cervical cancer or a condition that could lead to cancer, you need Pap tests and screening for cancer for at least 20 years after your treatment. If Pap tests have been discontinued, your risk factors (such as having a new sexual partner) need to be reassessed to determine if screening should resume. Some women have medical problems that increase the chance of getting cervical cancer. In these cases, your health care provider may recommend more frequent screening and Pap tests.  Colorectal cancer can be detected and often prevented. Most routine colorectal cancer screening begins at the age of 50 years and continues through age 75 years. However, your health care provider may recommend screening at an earlier age if you have risk factors for colon cancer. On a yearly basis, your health care provider may provide home test kits to check  for hidden blood in the stool. Use of a small camera at the end of a tube, to directly examine the colon (sigmoidoscopy or colonoscopy), can detect the earliest forms of colorectal cancer. Talk to your health care provider about this at age 50, when routine screening begins. Direct exam of the colon should be repeated every 5-10 years through age 75 years, unless early forms of precancerous polyps or small growths are found.  People who are at an increased risk for hepatitis B should be screened for this virus. You are considered at high risk for hepatitis B if:  You were born in a country where hepatitis B occurs often. Talk with your health care provider about which countries are considered high risk.  Your parents were born in a high-risk country and you have not received a shot to protect against hepatitis B (hepatitis B vaccine).  You have HIV or AIDS.  You use needles to inject street drugs.  You live with, or have sex with, someone who has hepatitis B.  You get hemodialysis treatment.  You take certain medicines for conditions like cancer, organ transplantation, and autoimmune conditions.  Hepatitis C blood testing is recommended for all people born from 1945 through 1965 and any individual with known risks for hepatitis C.  Practice safe sex. Use condoms and avoid high-risk sexual practices to reduce the spread of sexually transmitted infections (STIs). STIs include gonorrhea, chlamydia, syphilis, trichomonas, herpes, HPV, and human immunodeficiency virus (HIV). Herpes, HIV, and HPV are viral illnesses that have no cure. They can result in disability, cancer, and death.  You should be screened for sexually transmitted illnesses (STIs) including gonorrhea and chlamydia if:  You are sexually active and are younger than 24 years.  You are older than 24 years and your health care provider tells you that you are at risk for this type of infection.  Your sexual activity has changed  since you were last screened and you are at an increased risk for chlamydia or gonorrhea. Ask your health care provider if you are at risk.  If you are at risk of being infected with HIV, it is recommended that you take a prescription medicine daily to prevent HIV infection. This is called preexposure prophylaxis (PrEP). You are considered at risk if:  You are sexually active and do not regularly use condoms or know the HIV status of your partner(s).  You take drugs by injection.  You are sexually active with a partner who has HIV.  Talk with your health care provider about whether you are at high risk of being infected with HIV. If   you choose to begin PrEP, you should first be tested for HIV. You should then be tested every 3 months for as long as you are taking PrEP.  Osteoporosis is a disease in which the bones lose minerals and strength with aging. This can result in serious bone fractures or breaks. The risk of osteoporosis can be identified using a bone density scan. Women ages 67 years and over and women at risk for fractures or osteoporosis should discuss screening with their health care providers. Ask your health care provider whether you should take a calcium supplement or vitamin D to reduce the rate of osteoporosis.  Menopause can be associated with physical symptoms and risks. Hormone replacement therapy is available to decrease symptoms and risks. You should talk to your health care provider about whether hormone replacement therapy is right for you.  Use sunscreen. Apply sunscreen liberally and repeatedly throughout the day. You should seek shade when your shadow is shorter than you. Protect yourself by wearing long sleeves, pants, a wide-brimmed hat, and sunglasses year round, whenever you are outdoors.  Once a month, do a whole body skin exam, using a mirror to look at the skin on your back. Tell your health care provider of new moles, moles that have irregular borders, moles that  are larger than a pencil eraser, or moles that have changed in shape or color.  Stay current with required vaccines (immunizations).  Influenza vaccine. All adults should be immunized every year.  Tetanus, diphtheria, and acellular pertussis (Td, Tdap) vaccine. Pregnant women should receive 1 dose of Tdap vaccine during each pregnancy. The dose should be obtained regardless of the length of time since the last dose. Immunization is preferred during the 27th-36th week of gestation. An adult who has not previously received Tdap or who does not know her vaccine status should receive 1 dose of Tdap. This initial dose should be followed by tetanus and diphtheria toxoids (Td) booster doses every 10 years. Adults with an unknown or incomplete history of completing a 3-dose immunization series with Td-containing vaccines should begin or complete a primary immunization series including a Tdap dose. Adults should receive a Td booster every 10 years.  Varicella vaccine. An adult without evidence of immunity to varicella should receive 2 doses or a second dose if she has previously received 1 dose. Pregnant females who do not have evidence of immunity should receive the first dose after pregnancy. This first dose should be obtained before leaving the health care facility. The second dose should be obtained 4-8 weeks after the first dose.  Human papillomavirus (HPV) vaccine. Females aged 13-26 years who have not received the vaccine previously should obtain the 3-dose series. The vaccine is not recommended for use in pregnant females. However, pregnancy testing is not needed before receiving a dose. If a female is found to be pregnant after receiving a dose, no treatment is needed. In that case, the remaining doses should be delayed until after the pregnancy. Immunization is recommended for any person with an immunocompromised condition through the age of 61 years if she did not get any or all doses earlier. During the  3-dose series, the second dose should be obtained 4-8 weeks after the first dose. The third dose should be obtained 24 weeks after the first dose and 16 weeks after the second dose.  Zoster vaccine. One dose is recommended for adults aged 30 years or older unless certain conditions are present.  Measles, mumps, and rubella (MMR) vaccine. Adults born  before 1957 generally are considered immune to measles and mumps. Adults born in 1957 or later should have 1 or more doses of MMR vaccine unless there is a contraindication to the vaccine or there is laboratory evidence of immunity to each of the three diseases. A routine second dose of MMR vaccine should be obtained at least 28 days after the first dose for students attending postsecondary schools, health care workers, or international travelers. People who received inactivated measles vaccine or an unknown type of measles vaccine during 1963-1967 should receive 2 doses of MMR vaccine. People who received inactivated mumps vaccine or an unknown type of mumps vaccine before 1979 and are at high risk for mumps infection should consider immunization with 2 doses of MMR vaccine. For females of childbearing age, rubella immunity should be determined. If there is no evidence of immunity, females who are not pregnant should be vaccinated. If there is no evidence of immunity, females who are pregnant should delay immunization until after pregnancy. Unvaccinated health care workers born before 1957 who lack laboratory evidence of measles, mumps, or rubella immunity or laboratory confirmation of disease should consider measles and mumps immunization with 2 doses of MMR vaccine or rubella immunization with 1 dose of MMR vaccine.  Pneumococcal 13-valent conjugate (PCV13) vaccine. When indicated, a person who is uncertain of his immunization history and has no record of immunization should receive the PCV13 vaccine. All adults 65 years of age and older should receive this  vaccine. An adult aged 19 years or older who has certain medical conditions and has not been previously immunized should receive 1 dose of PCV13 vaccine. This PCV13 should be followed with a dose of pneumococcal polysaccharide (PPSV23) vaccine. Adults who are at high risk for pneumococcal disease should obtain the PPSV23 vaccine at least 8 weeks after the dose of PCV13 vaccine. Adults older than 74 years of age who have normal immune system function should obtain the PPSV23 vaccine dose at least 1 year after the dose of PCV13 vaccine.  Pneumococcal polysaccharide (PPSV23) vaccine. When PCV13 is also indicated, PCV13 should be obtained first. All adults aged 65 years and older should be immunized. An adult younger than age 65 years who has certain medical conditions should be immunized. Any person who resides in a nursing home or long-term care facility should be immunized. An adult smoker should be immunized. People with an immunocompromised condition and certain other conditions should receive both PCV13 and PPSV23 vaccines. People with human immunodeficiency virus (HIV) infection should be immunized as soon as possible after diagnosis. Immunization during chemotherapy or radiation therapy should be avoided. Routine use of PPSV23 vaccine is not recommended for American Indians, [place] Natives, or people younger than 65 years unless there are medical conditions that require PPSV23 vaccine. When indicated, people who have unknown immunization and have no record of immunization should receive PPSV23 vaccine. One-time revaccination 5 years after the first dose of PPSV23 is recommended for people aged 19-64 years who have chronic kidney failure, nephrotic syndrome, asplenia, or immunocompromised conditions. People who received 1-2 doses of PPSV23 before age 65 years should receive another dose of PPSV23 vaccine at age 65 years or later if at least 5 years have passed since the previous dose. Doses of PPSV23 are not  needed for people immunized with PPSV23 at or after age 65 years.  Meningococcal vaccine. Adults with asplenia or persistent complement component deficiencies should receive 2 doses of quadrivalent meningococcal conjugate (MenACWY-D) vaccine. The doses should be obtained   at least 2 months apart. Microbiologists working with certain meningococcal bacteria, [place] recruits, people at risk during an outbreak, and people who travel to or live in countries with a high rate of meningitis should be immunized. A first-year college student up through age 34 years who is living in a residence hall should receive a dose if she did not receive a dose on or after her 16th birthday. Adults who have certain high-risk conditions should receive one or more doses of vaccine.  Hepatitis A vaccine. Adults who wish to be protected from this disease, have certain high-risk conditions, work with hepatitis A-infected animals, work in hepatitis A research labs, or travel to or work in countries with a high rate of hepatitis A should be immunized. Adults who were previously unvaccinated and who anticipate close contact with an international adoptee during the first 60 days after arrival in the [place] from a country with a high rate of hepatitis A should be immunized.  Hepatitis B vaccine. Adults who wish to be protected from this disease, have certain high-risk conditions, may be exposed to blood or other infectious body fluids, are household contacts or sex partners of hepatitis B positive people, are clients or workers in certain care facilities, or travel to or work in countries with a high rate of hepatitis B should be immunized.  Haemophilus influenzae type b (Hib) vaccine. A previously unvaccinated person with asplenia or sickle cell disease or having a scheduled splenectomy should receive 1 dose of Hib vaccine. Regardless of previous immunization, a recipient of a hematopoietic stem cell transplant should receive a  3-dose series 6-12 months after her successful transplant. Hib vaccine is not recommended for adults with HIV infection. Preventive Services / Frequency Ages 35 to 4 years  Blood pressure check.** / Every 3-5 years.  Lipid and cholesterol check.** / Every 5 years beginning at age 60.  Clinical breast exam.** / Every 3 years for women in their 71s and 10s.  BRCA-related cancer risk assessment.** / For women who have family members with a BRCA-related cancer (breast, ovarian, tubal, or peritoneal cancers).  Pap test.** / Every 2 years from ages 76 through 26. Every 3 years starting at age 61 through age 76 or 93 with a history of 3 consecutive normal Pap tests.  HPV screening.** / Every 3 years from ages 37 through ages 60 to 51 with a history of 3 consecutive normal Pap tests.  Hepatitis C blood test.** / For any individual with known risks for hepatitis C.  Skin self-exam. / Monthly.  Influenza vaccine. / Every year.  Tetanus, diphtheria, and acellular pertussis (Tdap, Td) vaccine.** / Consult your health care provider. Pregnant women should receive 1 dose of Tdap vaccine during each pregnancy. 1 dose of Td every 10 years.  Varicella vaccine.** / Consult your health care provider. Pregnant females who do not have evidence of immunity should receive the first dose after pregnancy.  HPV vaccine. / 3 doses over 6 months, if 93 and younger. The vaccine is not recommended for use in pregnant females. However, pregnancy testing is not needed before receiving a dose.  Measles, mumps, rubella (MMR) vaccine.** / You need at least 1 dose of MMR if you were born in 1957 or later. You may also need a 2nd dose. For females of childbearing age, rubella immunity should be determined. If there is no evidence of immunity, females who are not pregnant should be vaccinated. If there is no evidence of immunity, females who are  pregnant should delay immunization until after pregnancy.  Pneumococcal  13-valent conjugate (PCV13) vaccine.** / Consult your health care provider.  Pneumococcal polysaccharide (PPSV23) vaccine.** / 1 to 2 doses if you smoke cigarettes or if you have certain conditions.  Meningococcal vaccine.** / 1 dose if you are age 68 to 8 years and a Market researcher living in a residence hall, or have one of several medical conditions, you need to get vaccinated against meningococcal disease. You may also need additional booster doses.  Hepatitis A vaccine.** / Consult your health care provider.  Hepatitis B vaccine.** / Consult your health care provider.  Haemophilus influenzae type b (Hib) vaccine.** / Consult your health care provider. Ages 7 to 53 years  Blood pressure check.** / Every year.  Lipid and cholesterol check.** / Every 5 years beginning at age 25 years.  Lung cancer screening. / Every year if you are aged 11-80 years and have a 30-pack-year history of smoking and currently smoke or have quit within the past 15 years. Yearly screening is stopped once you have quit smoking for at least 15 years or develop a health problem that would prevent you from having lung cancer treatment.  Clinical breast exam.** / Every year after age 48 years.  BRCA-related cancer risk assessment.** / For women who have family members with a BRCA-related cancer (breast, ovarian, tubal, or peritoneal cancers).  Mammogram.** / Every year beginning at age 41 years and continuing for as long as you are in good health. Consult with your health care provider.  Pap test.** / Every 3 years starting at age 65 years through age 37 or 70 years with a history of 3 consecutive normal Pap tests.  HPV screening.** / Every 3 years from ages 72 years through ages 60 to 40 years with a history of 3 consecutive normal Pap tests.  Fecal occult blood test (FOBT) of stool. / Every year beginning at age 21 years and continuing until age 5 years. You may not need to do this test if you get  a colonoscopy every 10 years.  Flexible sigmoidoscopy or colonoscopy.** / Every 5 years for a flexible sigmoidoscopy or every 10 years for a colonoscopy beginning at age 35 years and continuing until age 48 years.  Hepatitis C blood test.** / For all people born from [birth year] through 1965 and any individual with known risks for hepatitis C.  Skin self-exam. / Monthly.  Influenza vaccine. / Every year.  Tetanus, diphtheria, and acellular pertussis (Tdap/Td) vaccine.** / Consult your health care provider. Pregnant women should receive 1 dose of Tdap vaccine during each pregnancy. 1 dose of Td every 10 years.  Varicella vaccine.** / Consult your health care provider. Pregnant females who do not have evidence of immunity should receive the first dose after pregnancy.  Zoster vaccine.** / 1 dose for adults aged 30 years or older.  Measles, mumps, rubella (MMR) vaccine.** / You need at least 1 dose of MMR if you were born in 1957 or later. You may also need a second dose. For females of childbearing age, rubella immunity should be determined. If there is no evidence of immunity, females who are not pregnant should be vaccinated. If there is no evidence of immunity, females who are pregnant should delay immunization until after pregnancy.  Pneumococcal 13-valent conjugate (PCV13) vaccine.** / Consult your health care provider.  Pneumococcal polysaccharide (PPSV23) vaccine.** / 1 to 2 doses if you smoke cigarettes or if you have certain conditions.  Meningococcal vaccine.** /  Consult your health care provider.  Hepatitis A vaccine.** / Consult your health care provider.  Hepatitis B vaccine.** / Consult your health care provider.  Haemophilus influenzae type b (Hib) vaccine.** / Consult your health care provider. Ages 64 years and over  Blood pressure check.** / Every year.  Lipid and cholesterol check.** / Every 5 years beginning at age 23 years.  Lung cancer screening. / Every year if you  are aged 16-80 years and have a 30-pack-year history of smoking and currently smoke or have quit within the past 15 years. Yearly screening is stopped once you have quit smoking for at least 15 years or develop a health problem that would prevent you from having lung cancer treatment.  Clinical breast exam.** / Every year after age 74 years.  BRCA-related cancer risk assessment.** / For women who have family members with a BRCA-related cancer (breast, ovarian, tubal, or peritoneal cancers).  Mammogram.** / Every year beginning at age 44 years and continuing for as long as you are in good health. Consult with your health care provider.  Pap test.** / Every 3 years starting at age 58 years through age 22 or 39 years with 3 consecutive normal Pap tests. Testing can be stopped between 65 and 70 years with 3 consecutive normal Pap tests and no abnormal Pap or HPV tests in the past 10 years.  HPV screening.** / Every 3 years from ages 64 years through ages 70 or 61 years with a history of 3 consecutive normal Pap tests. Testing can be stopped between 65 and 70 years with 3 consecutive normal Pap tests and no abnormal Pap or HPV tests in the past 10 years.  Fecal occult blood test (FOBT) of stool. / Every year beginning at age 40 years and continuing until age 27 years. You may not need to do this test if you get a colonoscopy every 10 years.  Flexible sigmoidoscopy or colonoscopy.** / Every 5 years for a flexible sigmoidoscopy or every 10 years for a colonoscopy beginning at age 7 years and continuing until age 32 years.  Hepatitis C blood test.** / For all people born from [birth year] through 1965 and any individual with known risks for hepatitis C.  Osteoporosis screening.** / A one-time screening for women ages 30 years and over and women at risk for fractures or osteoporosis.  Skin self-exam. / Monthly.  Influenza vaccine. / Every year.  Tetanus, diphtheria, and acellular pertussis (Tdap/Td)  vaccine.** / 1 dose of Td every 10 years.  Varicella vaccine.** / Consult your health care provider.  Zoster vaccine.** / 1 dose for adults aged 35 years or older.  Pneumococcal 13-valent conjugate (PCV13) vaccine.** / Consult your health care provider.  Pneumococcal polysaccharide (PPSV23) vaccine.** / 1 dose for all adults aged 46 years and older.  Meningococcal vaccine.** / Consult your health care provider.  Hepatitis A vaccine.** / Consult your health care provider.  Hepatitis B vaccine.** / Consult your health care provider.  Haemophilus influenzae type b (Hib) vaccine.** / Consult your health care provider. ** Family history and personal history of risk and conditions may change your health care provider's recommendations.   This information is not intended to replace advice given to you by your health care provider. Make sure you discuss any questions you have with your health care provider.   Document Released: 05/04/2001 Document Revised: 03/29/2014 Document Reviewed: 08/03/2010 Elsevier Interactive Patient Education [employer].

## 2015-02-27 NOTE — Progress Notes (Signed)
Pre visit review using our clinic review tool, if applicable. No additional management support is needed unless otherwise documented below in the visit note. 

## 2015-02-27 NOTE — Assessment & Plan Note (Signed)
Dr Toney Rakes started Prolia D/c 'd Evista Vit D
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
## 2015-02-27 NOTE — Assessment & Plan Note (Signed)
Visual acuity: grossly normal, performs annual eye exam  ADLs: capable  Fall risk: none  Home safety: good  Cognitive evaluation: intact to orientation, naming, recall and repetition  EOL planning: adv directives, full code/ I agree  I have personally reviewed and have noted  1. The patient's medical, surgical and social history  2. Their use of alcohol, tobacco or illicit drugs  3. Their current medications and supplements  4. The patient's functional ability including ADL's, fall risks, home safety risks and hearing or visual impairment.  5. Diet and physical activities  6. Evidence for depression or mood disorders 7. The roster of all physicians providing medical care to patient - is listed in the Snapshot section of the chart and reviewed today.    Today patient counseled on age appropriate routine health concerns for screening and prevention, each reviewed and up to date or declined. Immunizations reviewed and up to date or declined. Labs ordered and reviewed. Risk factors for depression reviewed and negative. Hearing function and visual acuity are intact. ADLs screened and addressed as needed. Functional ability and level of safety reviewed and appropriate. Education, counseling and referrals performed based on assessed risks today. Patient provided with a copy of personalized plan for preventive services.

## 2015-06-17 ENCOUNTER — Telehealth: Payer: Self-pay | Admitting: Gynecology

## 2015-06-17 DIAGNOSIS — M810 Age-related osteoporosis without current pathological fracture: Secondary | ICD-10-CM

## 2015-06-17 NOTE — Telephone Encounter (Signed)
Prolia injection

## 2015-06-18 NOTE — Telephone Encounter (Signed)
prolia due after 06/25/2015   Calcium 10.0  10/31/2014   Deductible $290 (58mt), Co pay 20 % approx $420.00,  OOPM $3290 (0 met).  $15 co pay also with OV,  Total approx cost $710.00 PC to pt left message.

## 2015-06-23 NOTE — Telephone Encounter (Addendum)
Pt responsible for $710.00  Prolia injection schedule with AEX with JF on 07/30/2015, JF okd to wait until her visit with him. Patient understands and she will schedule next Prolia injection after 01/31/16 . She will need calcium level prior to 01/2016 . Order for calcium  Blood work made .

## 2015-06-26 ENCOUNTER — Encounter: Payer: Medicare Other | Admitting: Gynecology

## 2015-06-30 ENCOUNTER — Encounter: Payer: Medicare Other | Admitting: Gynecology

## 2015-07-30 ENCOUNTER — Encounter: Payer: Self-pay | Admitting: Gynecology

## 2015-07-30 ENCOUNTER — Ambulatory Visit (INDEPENDENT_AMBULATORY_CARE_PROVIDER_SITE_OTHER): Payer: Medicare Other | Admitting: Gynecology

## 2015-07-30 VITALS — BP 130/86 | Ht 61.0 in | Wt 101.0 lb

## 2015-07-30 DIAGNOSIS — M810 Age-related osteoporosis without current pathological fracture: Secondary | ICD-10-CM | POA: Diagnosis not present

## 2015-07-30 DIAGNOSIS — Z01419 Encounter for gynecological examination (general) (routine) without abnormal findings: Secondary | ICD-10-CM | POA: Diagnosis not present

## 2015-07-30 MED ORDER — DENOSUMAB 60 MG/ML ~~LOC~~ SOLN
60.0000 mg | Freq: Once | SUBCUTANEOUS | Status: AC
  Administered 2015-07-30: 60 mg via SUBCUTANEOUS

## 2015-07-30 NOTE — Patient Instructions (Signed)
Denosumab injection  What is this medicine?  DENOSUMAB (den oh sue mab) slows bone breakdown. Prolia is used to treat osteoporosis in women after menopause and in men. Xgeva is used to prevent bone fractures and other bone problems caused by cancer bone metastases. Xgeva is also used to treat giant cell tumor of the bone.  This medicine may be used for other purposes; ask your health care provider or pharmacist if you have questions.  What should I tell my health care provider before I take this medicine?  They need to know if you have any of these conditions:  -dental disease  -eczema  -infection or history of infections  -kidney disease or on dialysis  -low blood calcium or vitamin D  -malabsorption syndrome  -scheduled to have surgery or tooth extraction  -taking medicine that contains denosumab  -thyroid or parathyroid disease  -an unusual reaction to denosumab, other medicines, foods, dyes, or preservatives  -pregnant or trying to get pregnant  -breast-feeding  How should I use this medicine?  This medicine is for injection under the skin. It is given by a health care professional in a hospital or clinic setting.  If you are getting Prolia, a special MedGuide will be given to you by the pharmacist with each prescription and refill. Be sure to read this information carefully each time.  For Prolia, talk to your pediatrician regarding the use of this medicine in children. Special care may be needed. For Xgeva, talk to your pediatrician regarding the use of this medicine in children. While this drug may be prescribed for children as young as 13 years for selected conditions, precautions do apply.  Overdosage: If you think you have taken too much of this medicine contact a poison control center or emergency room at once.  NOTE: This medicine is only for you. Do not share this medicine with others.  What if I miss a dose?  It is important not to miss your dose. Call your doctor or health care professional if you are  unable to keep an appointment.  What may interact with this medicine?  Do not take this medicine with any of the following medications:  -other medicines containing denosumab  This medicine may also interact with the following medications:  -medicines that suppress the immune system  -medicines that treat cancer  -steroid medicines like prednisone or cortisone  This list may not describe all possible interactions. Give your health care provider a list of all the medicines, herbs, non-prescription drugs, or dietary supplements you use. Also tell them if you smoke, drink alcohol, or use illegal drugs. Some items may interact with your medicine.  What should I watch for while using this medicine?  Visit your doctor or health care professional for regular checks on your progress. Your doctor or health care professional may order blood tests and other tests to see how you are doing.  Call your doctor or health care professional if you get a cold or other infection while receiving this medicine. Do not treat yourself. This medicine may decrease your body's ability to fight infection.  You should make sure you get enough calcium and vitamin D while you are taking this medicine, unless your doctor tells you not to. Discuss the foods you eat and the vitamins you take with your health care professional.  See your dentist regularly. Brush and floss your teeth as directed. Before you have any dental work done, tell your dentist you are receiving this medicine.  Do   not become pregnant while taking this medicine or for 5 months after stopping it. Women should inform their doctor if they wish to become pregnant or think they might be pregnant. There is a potential for serious side effects to an unborn child. Talk to your health care professional or pharmacist for more information.  What side effects may I notice from receiving this medicine?  Side effects that you should report to your doctor or health care professional as soon as  possible:  -allergic reactions like skin rash, itching or hives, swelling of the face, lips, or tongue  -breathing problems  -chest pain  -fast, irregular heartbeat  -feeling faint or lightheaded, falls  -fever, chills, or any other sign of infection  -muscle spasms, tightening, or twitches  -numbness or tingling  -skin blisters or bumps, or is dry, peels, or red  -slow healing or unexplained pain in the mouth or jaw  -unusual bleeding or bruising  Side effects that usually do not require medical attention (Report these to your doctor or health care professional if they continue or are bothersome.):  -muscle pain  -stomach upset, gas  This list may not describe all possible side effects. Call your doctor for medical advice about side effects. You may report side effects to FDA at 1-800-FDA-1088.  Where should I keep my medicine?  This medicine is only given in a clinic, doctor's office, or other health care setting and will not be stored at home.  NOTE: This sheet is a summary. It may not cover all possible information. If you have questions about this medicine, talk to your doctor, pharmacist, or health care provider.      2016, [employer]. (2011-09-06 12:37:47)
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
## 2015-08-05 NOTE — Telephone Encounter (Signed)
Prolia injection on 07/30/15. Next injection due after 01/31/16. Will need calcium level blood work at PCP.

## 2015-08-28 ENCOUNTER — Encounter: Payer: Self-pay | Admitting: Internal Medicine

## 2015-08-28 ENCOUNTER — Ambulatory Visit (INDEPENDENT_AMBULATORY_CARE_PROVIDER_SITE_OTHER): Payer: Medicare Other | Admitting: Internal Medicine

## 2015-08-28 ENCOUNTER — Other Ambulatory Visit (INDEPENDENT_AMBULATORY_CARE_PROVIDER_SITE_OTHER): Payer: Medicare Other

## 2015-08-28 VITALS — BP 130/80 | HR 84 | Wt 102.0 lb

## 2015-08-28 DIAGNOSIS — Z0000 Encounter for general adult medical examination without abnormal findings: Secondary | ICD-10-CM

## 2015-08-28 DIAGNOSIS — E785 Hyperlipidemia, unspecified: Secondary | ICD-10-CM | POA: Diagnosis not present

## 2015-08-28 DIAGNOSIS — M810 Age-related osteoporosis without current pathological fracture: Secondary | ICD-10-CM | POA: Diagnosis not present

## 2015-08-28 DIAGNOSIS — L570 Actinic keratosis: Secondary | ICD-10-CM | POA: Diagnosis not present

## 2015-08-28 LAB — CBC WITH DIFFERENTIAL/PLATELET
BASOS ABS: 0 10*3/uL (ref 0.0–0.1)
Basophils Relative: 0.3 % (ref 0.0–3.0)
EOS ABS: 0.1 10*3/uL (ref 0.0–0.7)
Eosinophils Relative: 0.9 % (ref 0.0–5.0)
HCT: 41.1 % (ref 36.0–46.0)
Hemoglobin: 13.9 g/dL (ref 12.0–15.0)
LYMPHS ABS: 1.4 10*3/uL (ref 0.7–4.0)
Lymphocytes Relative: 20.4 % (ref 12.0–46.0)
MCHC: 33.7 g/dL (ref 30.0–36.0)
MCV: 87.7 fl (ref 78.0–100.0)
MONO ABS: 0.5 10*3/uL (ref 0.1–1.0)
MONOS PCT: 7.6 % (ref 3.0–12.0)
NEUTROS ABS: 4.8 10*3/uL (ref 1.4–7.7)
NEUTROS PCT: 70.8 % (ref 43.0–77.0)
PLATELETS: 280 10*3/uL (ref 150.0–400.0)
RBC: 4.69 Mil/uL (ref 3.87–5.11)
RDW: 13.2 % (ref 11.5–15.5)
WBC: 6.8 10*3/uL (ref 4.0–10.5)

## 2015-08-28 LAB — BASIC METABOLIC PANEL
BUN: 11 mg/dL (ref 6–23)
CO2: 31 mEq/L (ref 19–32)
Calcium: 9.8 mg/dL (ref 8.4–10.5)
Chloride: 99 mEq/L (ref 96–112)
Creatinine, Ser: 0.75 mg/dL (ref 0.40–1.20)
GFR: 80.06 mL/min (ref >60.00)
Glucose, Bld: 101 mg/dL — ABNORMAL HIGH (ref 70–99)
Potassium: 4.3 mEq/L (ref 3.5–5.1)
Sodium: 139 mEq/L (ref 135–145)

## 2015-08-28 LAB — URINALYSIS, ROUTINE W REFLEX MICROSCOPIC
Bilirubin Urine: NEGATIVE
Hgb urine dipstick: NEGATIVE
Nitrite: NEGATIVE
PH: 6 (ref 5.0–8.0)
RBC / HPF: NONE SEEN (ref 0–?)
Specific Gravity, Urine: <=1.005 — AB (ref 1.000–1.030)
TOTAL PROTEIN, URINE-UPE24: NEGATIVE
URINE GLUCOSE: NEGATIVE
UROBILINOGEN UA: 0.2 (ref 0.0–1.0)

## 2015-08-28 LAB — LIPID PANEL
Cholesterol: 198 mg/dL (ref 0–200)
HDL: 74.1 mg/dL (ref >39.00)
LDL Cholesterol: 108 mg/dL — ABNORMAL HIGH (ref 0–99)
NONHDL: 123.44
Total CHOL/HDL Ratio: 3
Triglycerides: 77 mg/dL (ref 0.0–149.0)
VLDL: 15.4 mg/dL (ref 0.0–40.0)

## 2015-08-28 LAB — TSH: TSH: 3.45 u[IU]/mL (ref 0.35–4.50)

## 2015-08-28 NOTE — Progress Notes (Signed)
Pre visit review using our clinic review tool, if applicable. No additional management support is needed unless otherwise documented below in the visit note. 

## 2015-08-28 NOTE — Patient Instructions (Signed)
   Postprocedure instructions :     Keep the wounds clean. You can wash them with liquid soap and water. Pat dry with gauze or a Kleenex tissue  Before applying antibiotic ointment and a Band-Aid.   You need to report immediately  if  any signs of infection develop.    

## 2015-08-28 NOTE — Assessment & Plan Note (Signed)
See procedure 

## 2015-08-28 NOTE — Progress Notes (Signed)
Subjective:  Patient ID: Joy Thompson, female    DOB: 1940-06-10  Age: 75 y.o. MRN: [id number]  CC: No chief complaint on file.   HPI MYRIKAL NEUHART presents for osteoporosis f/u C/o itchy spot on L neck x 2 wks  Outpatient Prescriptions Prior to Visit  Medication Sig Dispense Refill  . aspirin 81 MG EC tablet Take 81 mg by mouth daily.      . calcium-vitamin D (OSCAL WITH D 500-200) 500-200 MG-UNIT per tablet Take 1.5 tablets by mouth.     . Cholecalciferol 1000 UNITS tablet Take 1,000 Units by mouth daily.      Marland Kitchen denosumab (PROLIA) 60 MG/ML SOLN injection Inject 60 mg into the skin every 6 (six) months. Administer in upper arm, thigh, or abdomen    . Multiple Vitamins-Minerals (MULTIVITAMIN,TX-MINERALS) tablet Take 1 tablet by mouth daily.       No facility-administered medications prior to visit.    ROS Review of Systems  Constitutional: Negative for chills, activity change, appetite change, fatigue and unexpected weight change.  HENT: Negative for congestion, mouth sores and sinus pressure.   Eyes: Negative for visual disturbance.  Respiratory: Negative for cough and chest tightness.   Gastrointestinal: Negative for nausea and abdominal pain.  Genitourinary: Negative for frequency, difficulty urinating and vaginal pain.  Musculoskeletal: Negative for back pain and gait problem.  Skin: Negative for pallor and rash.  Neurological: Negative for dizziness, tremors, weakness, numbness and headaches.  Psychiatric/Behavioral: Negative for confusion and sleep disturbance.    Objective:  BP 160/90 mmHg  Pulse 84  Wt 102 lb (46.267 kg)  SpO2 96%  BP Readings from Last 3 Encounters:  08/28/15 160/90  07/30/15 130/86  02/27/15 120/70    Wt Readings from Last 3 Encounters:  08/28/15 102 lb (46.267 kg)  07/30/15 101 lb (45.813 kg)  02/27/15 102 lb (46.267 kg)    Physical Exam  Constitutional: She appears well-developed. No distress.  HENT:  Head: Normocephalic.    Right Ear: External ear normal.  Left Ear: External ear normal.  Nose: Nose normal.  Mouth/Throat: Oropharynx is clear and moist.  Eyes: Conjunctivae are normal. Pupils are equal, round, and reactive to light. Right eye exhibits no discharge. Left eye exhibits no discharge.  Neck: Normal range of motion. Neck supple. No JVD present. No tracheal deviation present. No thyromegaly present.  Cardiovascular: Normal rate, regular rhythm and normal heart sounds.   Pulmonary/Chest: No stridor. No respiratory distress. She has no wheezes.  Abdominal: Soft. Bowel sounds are normal. She exhibits no distension and no mass. There is no tenderness. There is no rebound and no guarding.  Musculoskeletal: She exhibits no edema or tenderness.  Lymphadenopathy:    She has no cervical adenopathy.  Neurological: She displays normal reflexes. No cranial nerve deficit. She exhibits normal muscle tone. Coordination normal.  Skin: No rash noted. No erythema.  Psychiatric: She has a normal mood and affect. Her behavior is normal. Judgment and thought content normal.   AK on L neck   Procedure Note :     Procedure : Cryosurgery   Indication: Actinic keratosis L neck   Risks including unsuccessful procedure , bleeding, infection, bruising, scar, a need for a repeat  procedure and others were explained to the patient in detail as well as the benefits. Informed consent was obtained verbally.     1 lesion(s)  on  L neck  was/were treated with liquid nitrogen on a Q-tip in a usual fasion .  Band-Aid was applied and antibiotic ointment was given for a later use.   Tolerated well. Complications none.     Lab Results  Component Value Date   WBC 5.0 08/14/2014   HGB 14.1 08/14/2014   HCT 41.7 08/14/2014   PLT 266.0 08/14/2014   GLUCOSE 96 08/14/2014   CHOL 168 08/14/2014   TRIG 61.0 08/14/2014   HDL 76.30 08/14/2014   LDLCALC 80 08/14/2014   ALT 15 08/14/2014   AST 22 08/14/2014   NA 142 08/14/2014   K  4.3 08/14/2014   CL 104 08/14/2014   CREATININE 0.74 10/31/2014   BUN 15 10/31/2014   CO2 33* 08/14/2014   TSH 4.18 08/14/2014    Dg Bone Density  08/28/2014  Date of study: 08/28/2014 Exam: DUAL X-RAY ABSORPTIOMETRY (DXA) FOR BONE MINERAL DENSITY (BMD) Instrument: [employer] Requesting Provider: PCP Indication: f/u for osteoporosis Comparison: 03/04/2009 Clinical data: Pt is a 75 y.o. female with h/o calcaneus and navicular fractures.On calcium, vitamin D and Evista. Previously on Actonel and Fosamax. Results:  Lumbar spine (L1-L4) Femoral neck (FN) T-score  - 2.4 RFN: - 2.7 LFN: - 2.3 Change in BMD from previous DXA test (%)  - 10.5%*  + 1.0% (*) statistically significant Assessment: Patient has OSTEOPOROSIS according to the [facility] classification for osteoporosis (see below). Fracture risk: high Comments: the technical quality of the study is good. Evaluation for secondary causes should be considered if clinically indicated. Recommend optimizing calcium (1200 mg/day) and vitamin D (800 IU/day). Followup: Repeat BMD is appropriate after 1-2 years. WHO criteria for diagnosis of osteoporosis in postmenopausal women and in men 39 y/o or older: - normal: T-score -1.0 to + 1.0 - osteopenia/low bone density: T-score between -2.5 and -1.0 - osteoporosis: T-score below -2.5 - severe osteoporosis: T-score below -2.5 with history of fragility fracture Note: although not part of the WHO classification, the presence of a fragility fracture, regardless of the T-score, should be considered diagnostic of osteoporosis, provided other causes for the fracture have been excluded. Treatment: The National Osteoporosis Foundation recommends that treatment be considered in postmenopausal women and men age 73 or older with: 1. Hip or vertebral (clinical or morphometric) fracture 2. T-score of - 2.5 or lower at the spine or hip 3. 10-year fracture probability by FRAX of at least 20% for a major osteoporotic fracture and 3%  for a hip fracture Philemon Kingdom, MD [facility]    Assessment & Plan:   There are no diagnoses linked to this encounter. I am having Ms. Vorce maintain her aspirin, (multivitamin,tx-minerals), calcium-vitamin D, Cholecalciferol, and denosumab.  No orders of the defined types were placed in this encounter.     Follow-up: No Follow-up on file.  Walker Kehr, MD

## 2015-08-29 LAB — HEPATITIS C ANTIBODY: HCV Ab: NEGATIVE

## 2015-09-18 LAB — HM MAMMOGRAPHY

## 2015-09-24 ENCOUNTER — Encounter: Payer: Self-pay | Admitting: Internal Medicine

## 2015-12-01 ENCOUNTER — Telehealth: Payer: Self-pay | Admitting: Internal Medicine

## 2015-12-01 ENCOUNTER — Ambulatory Visit (INDEPENDENT_AMBULATORY_CARE_PROVIDER_SITE_OTHER): Payer: Medicare Other | Admitting: Internal Medicine

## 2015-12-01 ENCOUNTER — Encounter: Payer: Self-pay | Admitting: Internal Medicine

## 2015-12-01 ENCOUNTER — Ambulatory Visit (HOSPITAL_COMMUNITY)
Admission: RE | Admit: 2015-12-01 | Discharge: 2015-12-01 | Disposition: A | Discharge status: Home / Self Care | Payer: Medicare Other | Source: Ambulatory Visit | Attending: Cardiology | Admitting: Cardiology

## 2015-12-01 VITALS — BP 154/84 | HR 75 | Temp 98.5°F | Resp 16 | Wt 104.0 lb

## 2015-12-01 DIAGNOSIS — M25473 Effusion, unspecified ankle: Secondary | ICD-10-CM | POA: Insufficient documentation

## 2015-12-01 DIAGNOSIS — I10 Essential (primary) hypertension: Secondary | ICD-10-CM | POA: Diagnosis not present

## 2015-12-01 DIAGNOSIS — M25471 Effusion, right ankle: Secondary | ICD-10-CM | POA: Diagnosis not present

## 2015-12-01 NOTE — Telephone Encounter (Signed)
Patient Name: Joy Thompson  DOB: [birth year]    Initial Comment Caller says, Rt foot is swollen and has a rash around her ankle, wants an appt    Nurse Assessment  Nurse: Raphael Gibney, RN, Vanita Ingles Date/Time (Eastern Time): 12/01/2015 8:49:43 AM  Confirm and document reason for call. If symptomatic, describe symptoms. You must click the next button to save text entered. ---Caller states her right foot is swollen. swelling goes down at night. Saw her cardiologist on Friday. Has a rash on her ankle.  Has the patient traveled out of the country within the last 30 days? ---Not Applicable  Does the patient have any new or worsening symptoms? ---Yes  Will a triage be completed? ---Yes  Related visit to physician within the last 2 weeks? ---No  Does the PT have any chronic conditions? (i.e. diabetes, asthma, etc.) ---Yes  List chronic conditions. ---osteoporosis  Is this a behavioral health or substance abuse call? ---No     Guidelines    Guideline Title Affirmed Question Affirmed Notes  Leg Swelling and Edema [1] Small area of LOCALIZED swelling AND [2] itchy    Final Disposition User   See PCP When Office is Open (within 3 days) Raphael Gibney, RN, Vanita Ingles    Comments  Triage outcome upgraded to see physician within 72 hrs as she has swelling in her right foot and a rash around her right ankle Already has appt with Dr. Quay Burow at 10:30 am today.   Disagree/Comply: Comply

## 2015-12-01 NOTE — Assessment & Plan Note (Addendum)
Low risk for blood clot, but we will get an ultrasound to rule out a DVT Swelling and likely related to probably No obvious injury or accident, but right Achilles is taking that she may have some type of chronic tendinitis Advised her to stop rising on her toes with lifting weights We'll refer to Dr. Tamala Julian for further evaluation Elevate foot when sitting
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
## 2015-12-01 NOTE — Patient Instructions (Addendum)
An ultrasound was ordered of your right leg to rule out a blood clot.    Medications reviewed and updated.  No changes recommended at this time.   A referral was ordered for Dr Tamala Julian

## 2015-12-01 NOTE — Telephone Encounter (Signed)
Please let her know the ultrasound was negative for blood clot.

## 2015-12-01 NOTE — Progress Notes (Signed)
Pre visit review using our clinic review tool, if applicable. No additional management support is needed unless otherwise documented below in the visit note. 

## 2015-12-02 NOTE — Telephone Encounter (Signed)
Spoke with pt to inform.  

## 2015-12-12 ENCOUNTER — Ambulatory Visit (INDEPENDENT_AMBULATORY_CARE_PROVIDER_SITE_OTHER): Payer: Medicare Other | Admitting: Nurse Practitioner

## 2015-12-12 ENCOUNTER — Encounter: Payer: Self-pay | Admitting: Nurse Practitioner

## 2015-12-12 VITALS — BP 148/67 | HR 92 | Temp 98.4°F | Ht 61.0 in | Wt 105.0 lb

## 2015-12-12 DIAGNOSIS — J069 Acute upper respiratory infection, unspecified: Secondary | ICD-10-CM | POA: Diagnosis not present

## 2015-12-12 MED ORDER — BENZONATATE 100 MG PO CAPS: 100.0000 mg | ORAL_CAPSULE | Freq: Three times a day (TID) | ORAL | 0 refills | Status: DC | PRN

## 2015-12-12 MED ORDER — IPRATROPIUM BROMIDE 0.03 % NA SOLN
2.0000 | Freq: Two times a day (BID) | NASAL | 0 refills | Status: DC
Start: 2015-12-12 — End: 2016-03-23

## 2015-12-12 MED ORDER — METHYLPREDNISOLONE ACETATE 40 MG/ML IJ SUSP: 40.0000 mg | Freq: Once | INTRAMUSCULAR | Status: DC

## 2015-12-12 MED ORDER — METHYLPREDNISOLONE ACETATE 80 MG/ML IJ SUSP
40.0000 mg | Freq: Once | INTRAMUSCULAR | Status: AC
  Administered 2015-12-12: 40 mg via INTRAMUSCULAR

## 2015-12-12 NOTE — Progress Notes (Signed)
Subjective:  Patient ID: Joy Thompson, female    DOB: 07-Jan-1941  Age: 75 y.o. MRN: [id number]  CC: URI (Pt stated having coughing,Hoarse, congested, mucous for about 4 days)   URI   This is a new problem. The current episode started in the past 7 days. The problem has been gradually worsening. There has been no fever. Associated symptoms include congestion, coughing, a plugged ear sensation, rhinorrhea, sinus pain, a sore throat and swollen glands. Pertinent negatives include no chest pain, headaches, nausea or wheezing. Treatments tried: mucinex. The treatment provided mild relief.    Outpatient Medications Prior to Visit  Medication Sig Dispense Refill  . aspirin 81 MG EC tablet Take 81 mg by mouth daily.      . calcium-vitamin D (OSCAL WITH D 500-200) 500-200 MG-UNIT per tablet Take 1.5 tablets by mouth.     . Cholecalciferol 1000 UNITS tablet Take 1,000 Units by mouth daily.      Marland Kitchen denosumab (PROLIA) 60 MG/ML SOLN injection Inject 60 mg into the skin every 6 (six) months. Administer in upper arm, thigh, or abdomen    . Multiple Vitamins-Minerals (MULTIVITAMIN,TX-MINERALS) tablet Take 1 tablet by mouth daily.       No facility-administered medications prior to visit.     ROS See HPI  Objective:  BP (!) 148/67 (BP Location: Left Arm, Patient Position: Sitting, Cuff Size: Normal)   Pulse 92   Temp 98.4 F (36.9 C)   Ht 5\' 1"  (1.549 m)   Wt 105 lb (47.6 kg)   BMI 19.84 kg/m   BP Readings from Last 3 Encounters:  12/12/15 (!) 148/67  12/01/15 (!) 154/84  08/28/15 130/80    Wt Readings from Last 3 Encounters:  12/12/15 105 lb (47.6 kg)  12/01/15 104 lb (47.2 kg)  08/28/15 102 lb (46.3 kg)    Physical Exam  Constitutional: She is oriented to person, place, and time. No distress.  HENT:  Right Ear: Tympanic membrane, external ear and ear canal normal.  Left Ear: Tympanic membrane, external ear and ear canal normal.  Nose: Mucosal edema and rhinorrhea present.    Mouth/Throat: Uvula is midline. Posterior oropharyngeal erythema present. No oropharyngeal exudate.  Eyes: No scleral icterus.  Neck: Normal range of motion. Neck supple.  Cardiovascular: Normal rate, regular rhythm and normal heart sounds.   Pulmonary/Chest: Effort normal and breath sounds normal. No respiratory distress.  Musculoskeletal: Normal range of motion. She exhibits no edema.  Lymphadenopathy:    She has no cervical adenopathy.  Neurological: She is alert and oriented to person, place, and time.  Skin: Skin is warm and dry.  Psychiatric: She has a normal mood and affect. Her behavior is normal.    Lab Results  Component Value Date   WBC 6.8 08/28/2015   HGB 13.9 08/28/2015   HCT 41.1 08/28/2015   PLT 280.0 08/28/2015   GLUCOSE 101 (H) 08/28/2015   CHOL 198 08/28/2015   TRIG 77.0 08/28/2015   HDL 74.10 08/28/2015   LDLCALC 108 (H) 08/28/2015   ALT 15 08/14/2014   AST 22 08/14/2014   NA 139 08/28/2015   K 4.3 08/28/2015   CL 99 08/28/2015   CREATININE 0.75 08/28/2015   BUN 11 08/28/2015   CO2 31 08/28/2015   TSH 3.45 08/28/2015    Assessment & Plan:   Joy Thompson was seen today for uri.  Diagnoses and all orders for this visit:  Acute URI -     methylPREDNISolone acetate (DEPO-MEDROL) injection 40  mg; Inject 1 mL (40 mg total) into the muscle once. -     ipratropium (ATROVENT) 0.03 % nasal spray; Place 2 sprays into both nostrils 2 (two) times daily. Do not use for more than 5days. -     benzonatate (TESSALON) 100 MG capsule; Take 1 capsule (100 mg total) by mouth 3 (three) times daily as needed for cough.   I am having Joy Thompson start on ipratropium and benzonatate. I am also having her maintain her aspirin, (multivitamin,tx-minerals), calcium-vitamin D, Cholecalciferol, denosumab, triamcinolone cream, and guaiFENesin. We will continue to administer methylPREDNISolone acetate.  Meds ordered this encounter  Medications  . triamcinolone cream (KENALOG) 0.1 %   . guaiFENesin (MUCINEX) 600 MG 12 hr tablet    Sig: Take by mouth 2 (two) times daily.  . methylPREDNISolone acetate (DEPO-MEDROL) injection 40 mg  . ipratropium (ATROVENT) 0.03 % nasal spray    Sig: Place 2 sprays into both nostrils 2 (two) times daily. Do not use for more than 5days.    Dispense:  30 mL    Refill:  0    Order Specific Question:   Supervising Provider    Answer:   Cassandria Anger [[id number]]  . benzonatate (TESSALON) 100 MG capsule    Sig: Take 1 capsule (100 mg total) by mouth 3 (three) times daily as needed for cough.    Dispense:  20 capsule    Refill:  0    Order Specific Question:   Supervising Provider    Answer:   Cassandria Anger [[id number]]    Follow-up: Return if symptoms worsen or fail to improve.  Wilfred Lacy, NP

## 2015-12-12 NOTE — Progress Notes (Signed)
Pre visit review using our clinic review tool, if applicable. No additional management support is needed unless otherwise documented below in the visit note. 

## 2015-12-12 NOTE — Patient Instructions (Signed)
URI Instructions: Use over-the-counter  "cold" medicines  such as "Tylenol cold" , "Advil cold",  "Mucinex" or" Mucinex D"  for cough and congestion.  Avoid decongestants if you have high blood pressure. Use" Delsym" or" Robitussin" cough syrup varietis for cough.  You can use plain "Tylenol" or "Advi"l for fever, chills and achyness.   "Common cold" symptoms are usually triggered by a virus.  The antibiotics are usually not necessary. On average, a" viral cold" illness would take 4-7 days to resolve. Please, make an appointment if you are not better or if you're worse.

## 2015-12-16 ENCOUNTER — Telehealth: Payer: Self-pay | Admitting: Gynecology

## 2015-12-16 NOTE — Telephone Encounter (Signed)
Prolia injection due after 01/30/2016.. Calcium   Level 9.8 08/28/15. pc to pt will call her when we receive benefits from insurance.
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
## 2015-12-18 ENCOUNTER — Other Ambulatory Visit: Payer: Self-pay

## 2015-12-18 ENCOUNTER — Encounter: Payer: Self-pay | Admitting: Family Medicine

## 2015-12-18 ENCOUNTER — Ambulatory Visit (INDEPENDENT_AMBULATORY_CARE_PROVIDER_SITE_OTHER): Payer: Medicare Other | Admitting: Family Medicine

## 2015-12-18 VITALS — BP 136/84 | HR 75 | Wt 104.0 lb

## 2015-12-18 DIAGNOSIS — M79671 Pain in right foot: Secondary | ICD-10-CM

## 2015-12-18 DIAGNOSIS — R609 Edema, unspecified: Secondary | ICD-10-CM | POA: Diagnosis not present

## 2015-12-18 NOTE — Assessment & Plan Note (Signed)
New problem. I believe it is bilateral.  Patient only notices it in the right.  Doppler negative, My ultrasound only shows soft tissue swelling.  Resolves in AM.  Stockings, elevations, discussed diet changes.  If worsens to return.

## 2015-12-18 NOTE — Patient Instructions (Addendum)
Good to see you  Ice is your friend if you need it Support stockings would be great  When sitting try to get foot above heart to help with the swelling.  Vitamin D 3000 IU daily  To to decrease salt in your diet.  If it worsens or you get pan come back and see me

## 2016-01-06 NOTE — Telephone Encounter (Signed)
Prolia insurance benefits Deductible $290 ( met), co insurance 20% approx $420,  OOP MAX $3290  (558 met). Calcium level 9.8  08/28/2015  Injection date after 01/31/16 Leave VM for pt to call and schedule appointment.

## 2016-01-07 NOTE — Telephone Encounter (Signed)
Phone call from pt appointment for Prolia on 02/02/16 at [facility] only

## 2016-02-02 ENCOUNTER — Ambulatory Visit (INDEPENDENT_AMBULATORY_CARE_PROVIDER_SITE_OTHER): Payer: Medicare Other | Admitting: Anesthesiology

## 2016-02-02 DIAGNOSIS — M810 Age-related osteoporosis without current pathological fracture: Secondary | ICD-10-CM | POA: Diagnosis not present

## 2016-02-02 MED ORDER — DENOSUMAB 60 MG/ML ~~LOC~~ SOLN
60.0000 mg | Freq: Once | SUBCUTANEOUS | Status: AC
  Administered 2016-02-02: 60 mg via SUBCUTANEOUS

## 2016-02-03 NOTE — Telephone Encounter (Signed)
prolia given 02/02/16 next due after 08/02/16

## 2016-02-27 ENCOUNTER — Ambulatory Visit (INDEPENDENT_AMBULATORY_CARE_PROVIDER_SITE_OTHER): Payer: Medicare Other | Admitting: Internal Medicine

## 2016-02-27 ENCOUNTER — Other Ambulatory Visit (INDEPENDENT_AMBULATORY_CARE_PROVIDER_SITE_OTHER): Payer: Medicare Other

## 2016-02-27 ENCOUNTER — Encounter: Payer: Self-pay | Admitting: Internal Medicine

## 2016-02-27 VITALS — BP 138/80 | HR 85 | Temp 98.8°F | Ht 61.0 in | Wt 102.0 lb

## 2016-02-27 DIAGNOSIS — Z23 Encounter for immunization: Secondary | ICD-10-CM | POA: Diagnosis not present

## 2016-02-27 DIAGNOSIS — Z0000 Encounter for general adult medical examination without abnormal findings: Secondary | ICD-10-CM

## 2016-02-27 DIAGNOSIS — E785 Hyperlipidemia, unspecified: Secondary | ICD-10-CM

## 2016-02-27 LAB — COMPREHENSIVE METABOLIC PANEL
ALBUMIN: 4.6 g/dL (ref 3.5–5.2)
ALK PHOS: 57 U/L (ref 39–117)
ALT: 17 U/L (ref 0–35)
AST: 20 U/L (ref 0–37)
BILIRUBIN TOTAL: 0.5 mg/dL (ref 0.2–1.2)
BUN: 10 mg/dL (ref 6–23)
CALCIUM: 9.6 mg/dL (ref 8.4–10.5)
CO2: 32 mEq/L (ref 19–32)
Chloride: 103 mEq/L (ref 96–112)
Creatinine, Ser: 0.76 mg/dL (ref 0.40–1.20)
GFR: 78.74 mL/min (ref >60.00)
Glucose, Bld: 102 mg/dL — ABNORMAL HIGH (ref 70–99)
POTASSIUM: 4 meq/L (ref 3.5–5.1)
Sodium: 143 mEq/L (ref 135–145)
TOTAL PROTEIN: 7.3 g/dL (ref 6.0–8.3)

## 2016-02-27 LAB — CBC WITH DIFFERENTIAL/PLATELET
BASOS ABS: 0 10*3/uL (ref 0.0–0.1)
Basophils Relative: 0.5 % (ref 0.0–3.0)
EOS ABS: 0.1 10*3/uL (ref 0.0–0.7)
Eosinophils Relative: 1.5 % (ref 0.0–5.0)
HCT: 42.8 % (ref 36.0–46.0)
Hemoglobin: 14.4 g/dL (ref 12.0–15.0)
LYMPHS ABS: 1.5 10*3/uL (ref 0.7–4.0)
LYMPHS PCT: 23.7 % (ref 12.0–46.0)
MCHC: 33.7 g/dL (ref 30.0–36.0)
MCV: 88.2 fl (ref 78.0–100.0)
MONOS PCT: 8.2 % (ref 3.0–12.0)
Monocytes Absolute: 0.5 10*3/uL (ref 0.1–1.0)
Neutro Abs: 4.3 10*3/uL (ref 1.4–7.7)
Neutrophils Relative %: 66.1 % (ref 43.0–77.0)
Platelets: 320 10*3/uL (ref 150.0–400.0)
RBC: 4.85 Mil/uL (ref 3.87–5.11)
RDW: 13.4 % (ref 11.5–15.5)
WBC: 6.4 10*3/uL (ref 4.0–10.5)

## 2016-02-27 LAB — LIPID PANEL
Cholesterol: 213 mg/dL — ABNORMAL HIGH (ref 0–200)
HDL: 81.8 mg/dL (ref >39.00)
LDL Cholesterol: 115 mg/dL — ABNORMAL HIGH (ref 0–99)
NonHDL: 131.68
TRIGLYCERIDES: 81 mg/dL (ref 0.0–149.0)
Total CHOL/HDL Ratio: 3
VLDL: 16.2 mg/dL (ref 0.0–40.0)

## 2016-02-27 LAB — URINALYSIS, ROUTINE W REFLEX MICROSCOPIC
BILIRUBIN URINE: NEGATIVE
Hgb urine dipstick: NEGATIVE
Ketones, ur: NEGATIVE
NITRITE: NEGATIVE
PH: 7 (ref 5.0–8.0)
RBC / HPF: NONE SEEN (ref 0–?)
Specific Gravity, Urine: <=1.005 — AB (ref 1.000–1.030)
TOTAL PROTEIN, URINE-UPE24: NEGATIVE
URINE GLUCOSE: NEGATIVE
UROBILINOGEN UA: 0.2 (ref 0.0–1.0)

## 2016-02-27 LAB — TSH: TSH: 2.85 u[IU]/mL (ref 0.35–4.50)

## 2016-02-27 NOTE — Assessment & Plan Note (Signed)

## 2016-02-27 NOTE — Progress Notes (Signed)
Pre visit review using our clinic review tool, if applicable. No additional management support is needed unless otherwise documented below in the visit note. 

## 2016-02-27 NOTE — Patient Instructions (Signed)

## 2016-02-27 NOTE — Progress Notes (Signed)
Subjective:  Patient ID: Joy Thompson, female    DOB: 01-16-1941  Age: 75 y.o. MRN: [id number]  CC: No chief complaint on file.   HPI Joy Thompson presents for well exam C/o R leg swelling after exercising - much better - Doppler was (-)  Outpatient Medications Prior to Visit  Medication Sig Dispense Refill  . aspirin 81 MG EC tablet Take 81 mg by mouth daily.      . calcium-vitamin D (OSCAL WITH D 500-200) 500-200 MG-UNIT per tablet Take 1.5 tablets by mouth.     . Cholecalciferol 1000 UNITS tablet Take 1,000 Units by mouth daily.      Marland Kitchen denosumab (PROLIA) 60 MG/ML SOLN injection Inject 60 mg into the skin every 6 (six) months. Administer in upper arm, thigh, or abdomen    . Multiple Vitamins-Minerals (MULTIVITAMIN,TX-MINERALS) tablet Take 1 tablet by mouth daily.      . benzonatate (TESSALON) 100 MG capsule Take 1 capsule (100 mg total) by mouth 3 (three) times daily as needed for cough. (Patient not taking: Reported on 02/27/2016) 20 capsule 0  . guaiFENesin (MUCINEX) 600 MG 12 hr tablet Take by mouth 2 (two) times daily.    Marland Kitchen ipratropium (ATROVENT) 0.03 % nasal spray Place 2 sprays into both nostrils 2 (two) times daily. Do not use for more than 5days. (Patient not taking: Reported on 02/27/2016) 30 mL 0  . triamcinolone cream (KENALOG) 0.1 %      No facility-administered medications prior to visit.     ROS Review of Systems  Constitutional: Negative for activity change, appetite change, chills, fatigue and unexpected weight change.  HENT: Negative for congestion, mouth sores and sinus pressure.   Eyes: Negative for visual disturbance.  Respiratory: Negative for cough, chest tightness and shortness of breath.   Gastrointestinal: Negative for abdominal pain and nausea.  Genitourinary: Negative for difficulty urinating, frequency and vaginal pain.  Musculoskeletal: Negative for back pain and gait problem.  Skin: Negative for pallor and rash.  Neurological: Negative for  dizziness, tremors, weakness, numbness and headaches.  Psychiatric/Behavioral: Negative for confusion and sleep disturbance.    Objective:  BP 138/80   Pulse 85   Temp 98.8 F (37.1 C) (Oral)   Ht 5\' 1"  (1.549 m)   Wt 102 lb (46.3 kg)   SpO2 94%   BMI 19.27 kg/m   BP Readings from Last 3 Encounters:  02/27/16 138/80  12/18/15 136/84  12/12/15 (!) 148/67    Wt Readings from Last 3 Encounters:  02/27/16 102 lb (46.3 kg)  12/18/15 104 lb (47.2 kg)  12/12/15 105 lb (47.6 kg)    Physical Exam  Constitutional: She appears well-developed. No distress.  HENT:  Head: Normocephalic.  Right Ear: External ear normal.  Left Ear: External ear normal.  Nose: Nose normal.  Mouth/Throat: Oropharynx is clear and moist.  Eyes: Conjunctivae are normal. Pupils are equal, round, and reactive to light. Right eye exhibits no discharge. Left eye exhibits no discharge.  Neck: Normal range of motion. Neck supple. No JVD present. No tracheal deviation present. No thyromegaly present.  Cardiovascular: Normal rate, regular rhythm and normal heart sounds.   Pulmonary/Chest: No stridor. No respiratory distress. She has no wheezes.  Abdominal: Soft. Bowel sounds are normal. She exhibits no distension and no mass. There is no tenderness. There is no rebound and no guarding.  Musculoskeletal: She exhibits edema. She exhibits no tenderness.  Lymphadenopathy:    She has no cervical adenopathy.  Neurological: She  displays normal reflexes. No cranial nerve deficit. She exhibits normal muscle tone. Coordination normal.  Skin: No rash noted. No erythema.  Psychiatric: She has a normal mood and affect. Her behavior is normal. Judgment and thought content normal.   RLE w/trace edema Lab Results  Component Value Date   WBC 6.8 08/28/2015   HGB 13.9 08/28/2015   HCT 41.1 08/28/2015   PLT 280.0 08/28/2015   GLUCOSE 101 (H) 08/28/2015   CHOL 198 08/28/2015   TRIG 77.0 08/28/2015   HDL 74.10 08/28/2015    LDLCALC 108 (H) 08/28/2015   ALT 15 08/14/2014   AST 22 08/14/2014   NA 139 08/28/2015   K 4.3 08/28/2015   CL 99 08/28/2015   CREATININE 0.75 08/28/2015   BUN 11 08/28/2015   CO2 31 08/28/2015   TSH 3.45 08/28/2015    No results found.  Assessment & Plan:   There are no diagnoses linked to this encounter. I am having Ms. Navarette maintain her aspirin, (multivitamin,tx-minerals), calcium-vitamin D, Cholecalciferol, denosumab, triamcinolone cream, guaiFENesin, ipratropium, and benzonatate.  No orders of the defined types were placed in this encounter.    Follow-up: No Follow-up on file.  Walker Kehr, MD

## 2016-03-23 ENCOUNTER — Ambulatory Visit (INDEPENDENT_AMBULATORY_CARE_PROVIDER_SITE_OTHER): Payer: Medicare Other | Admitting: Internal Medicine

## 2016-03-23 ENCOUNTER — Encounter: Payer: Self-pay | Admitting: Internal Medicine

## 2016-03-23 VITALS — BP 140/88 | HR 82 | Wt 106.0 lb

## 2016-03-23 DIAGNOSIS — N3090 Cystitis, unspecified without hematuria: Secondary | ICD-10-CM

## 2016-03-23 DIAGNOSIS — R300 Dysuria: Secondary | ICD-10-CM | POA: Diagnosis not present

## 2016-03-23 LAB — POC URINALSYSI DIPSTICK (AUTOMATED)
Bilirubin, UA: NEGATIVE
GLUCOSE UA: NEGATIVE
Ketones, UA: NEGATIVE
NITRITE UA: NEGATIVE
Spec Grav, UA: 1.015
UROBILINOGEN UA: NEGATIVE
pH, UA: 7

## 2016-03-23 MED ORDER — PHENAZOPYRIDINE HCL 100 MG PO TABS: 100.0000 mg | ORAL_TABLET | Freq: Three times a day (TID) | ORAL | 1 refills | Status: DC | PRN

## 2016-03-23 MED ORDER — CEFUROXIME AXETIL 250 MG PO TABS: 250.0000 mg | ORAL_TABLET | Freq: Two times a day (BID) | ORAL | 0 refills | Status: AC

## 2016-03-23 NOTE — Assessment & Plan Note (Addendum)
UA ceftin 250 mg bid Pyridium prn

## 2016-03-23 NOTE — Progress Notes (Signed)
   Subjective:  Patient ID: Joy Thompson, female    DOB: [birth year]  Age: 76 y.o. MRN: [id number]  CC: Dysuria (BURNING WHEN URINATING, GONG ON FOR 4 DAYS NOW. SOME LOW BACK PAIN. )   Dysuria   The current episode started in the past 7 days. The problem has been waxing and waning. The pain is mild. Associated symptoms include chills, a discharge, frequency and urgency. She has tried nothing for the symptoms. There is no history of catheterization or recurrent UTIs.   Joy Thompson presents for dysuria  Outpatient Medications Prior to Visit  Medication Sig Dispense Refill  . aspirin 81 MG EC tablet Take 81 mg by mouth daily.      . calcium-vitamin D (OSCAL WITH D 500-200) 500-200 MG-UNIT per tablet Take 1.5 tablets by mouth.     . Cholecalciferol 1000 UNITS tablet Take 1,000 Units by mouth daily.      Marland Kitchen denosumab (PROLIA) 60 MG/ML SOLN injection Inject 60 mg into the skin every 6 (six) months. Administer in upper arm, thigh, or abdomen    . Multiple Vitamins-Minerals (MULTIVITAMIN,TX-MINERALS) tablet Take 1 tablet by mouth daily.      Marland Kitchen triamcinolone cream (KENALOG) 0.1 %     . benzonatate (TESSALON) 100 MG capsule Take 1 capsule (100 mg total) by mouth 3 (three) times daily as needed for cough. (Patient not taking: Reported on 03/23/2016) 20 capsule 0  . guaiFENesin (MUCINEX) 600 MG 12 hr tablet Take by mouth 2 (two) times daily.    Marland Kitchen ipratropium (ATROVENT) 0.03 % nasal spray Place 2 sprays into both nostrils 2 (two) times daily. Do not use for more than 5days. (Patient not taking: Reported on 03/23/2016) 30 mL 0   No facility-administered medications prior to visit.     ROS Review of Systems  Constitutional: Positive for chills.  Genitourinary: Positive for dysuria, frequency and urgency.    Objective:  BP 140/88   Pulse 82   Wt 106 lb (48.1 kg)   SpO2 97%   BMI 20.03 kg/m   BP Readings from Last 3 Encounters:  03/23/16 140/88  02/27/16 138/80  12/18/15 136/84    Wt  Readings from Last 3 Encounters:  03/23/16 106 lb (48.1 kg)  02/27/16 102 lb (46.3 kg)  12/18/15 104 lb (47.2 kg)    Physical Exam  Lab Results  Component Value Date   WBC 6.4 02/27/2016   HGB 14.4 02/27/2016   HCT 42.8 02/27/2016   PLT 320.0 02/27/2016   GLUCOSE 102 (H) 02/27/2016   CHOL 213 (H) 02/27/2016   TRIG 81.0 02/27/2016   HDL 81.80 02/27/2016   LDLCALC 115 (H) 02/27/2016   ALT 17 02/27/2016   AST 20 02/27/2016   NA 143 02/27/2016   K 4.0 02/27/2016   CL 103 02/27/2016   CREATININE 0.76 02/27/2016   BUN 10 02/27/2016   CO2 32 02/27/2016   TSH 2.85 02/27/2016    No results found.  Assessment & Plan:   Joy Thompson was seen today for dysuria.  Diagnoses and all orders for this visit:  Dysuria   I have discontinued Ms. Gregori's guaiFENesin, ipratropium, and benzonatate. I am also having her maintain her aspirin, (multivitamin,tx-minerals), calcium-vitamin D, Cholecalciferol, denosumab, and triamcinolone cream.  No orders of the defined types were placed in this encounter.    Follow-up: No Follow-up on file.  Walker Kehr, MD

## 2016-05-28 ENCOUNTER — Ambulatory Visit (INDEPENDENT_AMBULATORY_CARE_PROVIDER_SITE_OTHER): Payer: Medicare Other | Admitting: Internal Medicine

## 2016-05-28 ENCOUNTER — Encounter: Payer: Self-pay | Admitting: Internal Medicine

## 2016-05-28 VITALS — BP 142/82 | HR 79 | Temp 98.4°F | Wt 105.0 lb

## 2016-05-28 DIAGNOSIS — R300 Dysuria: Secondary | ICD-10-CM

## 2016-05-28 MED ORDER — CEPHALEXIN 500 MG PO CAPS: 500.0000 mg | ORAL_CAPSULE | Freq: Three times a day (TID) | ORAL | 0 refills | Status: AC
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
## 2016-05-28 NOTE — Patient Instructions (Signed)
Please take all new medication as prescribed - the antibiotic  Please continue all other medications as before, and refills have been done if requested.  Please have the pharmacy call with any other refills you may need.  Please keep your appointments with your specialists as you may have planned   

## 2016-05-28 NOTE — Assessment & Plan Note (Signed)
C/w probable uti, unable to give specimen, for empiric tx uti,  to f/u any worsening symptoms or concerns

## 2016-07-29 ENCOUNTER — Telehealth: Payer: Self-pay | Admitting: Gynecology

## 2016-07-30 ENCOUNTER — Encounter: Payer: Self-pay | Admitting: Gynecology

## 2016-07-30 ENCOUNTER — Ambulatory Visit (INDEPENDENT_AMBULATORY_CARE_PROVIDER_SITE_OTHER): Payer: Medicare Other | Admitting: Gynecology

## 2016-07-30 VITALS — BP 112/78 | Ht 62.0 in | Wt 104.0 lb

## 2016-07-30 DIAGNOSIS — M810 Age-related osteoporosis without current pathological fracture: Secondary | ICD-10-CM | POA: Insufficient documentation

## 2016-07-30 DIAGNOSIS — Z01419 Encounter for gynecological examination (general) (routine) without abnormal findings: Secondary | ICD-10-CM | POA: Diagnosis not present

## 2016-07-30 NOTE — Patient Instructions (Addendum)
Denosumab injection What is this medicine? DENOSUMAB (den oh sue mab) slows bone breakdown. Prolia is used to treat osteoporosis in women after menopause and in men. Delton See is used to treat a high calcium level due to cancer and to prevent bone fractures and other bone problems caused by multiple myeloma or cancer bone metastases. Delton See is also used to treat giant cell tumor of the bone. This medicine may be used for other purposes; ask your health care provider or pharmacist if you have questions. COMMON BRAND NAME(S): Prolia, XGEVA What should I tell my health care provider before I take this medicine? They need to know if you have any of these conditions: -dental disease -having surgery or tooth extraction -infection -kidney disease -low levels of calcium or Vitamin D in the blood -malnutrition -on hemodialysis -skin conditions or sensitivity -thyroid or parathyroid disease -an unusual reaction to denosumab, other medicines, foods, dyes, or preservatives -pregnant or trying to get pregnant -breast-feeding How should I use this medicine? This medicine is for injection under the skin. It is given by a health care professional in a hospital or clinic setting. If you are getting Prolia, a special MedGuide will be given to you by the pharmacist with each prescription and refill. Be sure to read this information carefully each time. For Prolia, talk to your pediatrician regarding the use of this medicine in children. Special care may be needed. For Delton See, talk to your pediatrician regarding the use of this medicine in children. While this drug may be prescribed for children as young as 13 years for selected conditions, precautions do apply. Overdosage: If you think you have taken too much of this medicine contact a poison control center or emergency room at once. NOTE: This medicine is only for you. Do not share this medicine with others. What if I miss a dose? It is important not to miss your  dose. Call your doctor or health care professional if you are unable to keep an appointment. What may interact with this medicine? Do not take this medicine with any of the following medications: -other medicines containing denosumab This medicine may also interact with the following medications: -medicines that lower your chance of fighting infection -steroid medicines like prednisone or cortisone This list may not describe all possible interactions. Give your health care provider a list of all the medicines, herbs, non-prescription drugs, or dietary supplements you use. Also tell them if you smoke, drink alcohol, or use illegal drugs. Some items may interact with your medicine. What should I watch for while using this medicine? Visit your doctor or health care professional for regular checks on your progress. Your doctor or health care professional may order blood tests and other tests to see how you are doing. Call your doctor or health care professional for advice if you get a fever, chills or sore throat, or other symptoms of a cold or flu. Do not treat yourself. This drug may decrease your body's ability to fight infection. Try to avoid being around people who are sick. You should make sure you get enough calcium and vitamin D while you are taking this medicine, unless your doctor tells you not to. Discuss the foods you eat and the vitamins you take with your health care professional. See your dentist regularly. Brush and floss your teeth as directed. Before you have any dental work done, tell your dentist you are receiving this medicine. Do not become pregnant while taking this medicine or for 5 months after stopping  it. Talk with your doctor or health care professional about your birth control options while taking this medicine. Women should inform their doctor if they wish to become pregnant or think they might be pregnant. There is a potential for serious side effects to an unborn child. Talk  to your health care professional or pharmacist for more information. What side effects may I notice from receiving this medicine? Side effects that you should report to your doctor or health care professional as soon as possible: -allergic reactions like skin rash, itching or hives, swelling of the face, lips, or tongue -bone pain -breathing problems -dizziness -jaw pain, especially after dental work -redness, blistering, peeling of the skin -signs and symptoms of infection like fever or chills; cough; sore throat; pain or trouble passing urine -signs of low calcium like fast heartbeat, muscle cramps or muscle pain; pain, tingling, numbness in the hands or feet; seizures -unusual bleeding or bruising -unusually weak or tired Side effects that usually do not require medical attention (report to your doctor or health care professional if they continue or are bothersome): -constipation -diarrhea -headache -joint pain -loss of appetite -muscle pain -runny nose -tiredness -upset stomach This list may not describe all possible side effects. Call your doctor for medical advice about side effects. You may report side effects to FDA at 1-800-FDA-1088. Where should I keep my medicine? This medicine is only given in a clinic, doctor's office, or other health care setting and will not be stored at home. NOTE: This sheet is a summary. It may not cover all possible information. If you have questions about this medicine, talk to your doctor, pharmacist, or health care provider.  2018 Elsevier/Gold Standard (2016-03-30 19:17:21) Osteoporosis Osteoporosis is the thinning and loss of density in the bones. Osteoporosis makes the bones more brittle, fragile, and likely to break (fracture). Over time, osteoporosis can cause the bones to become so weak that they fracture after a simple fall. The bones most likely to fracture are the bones in the hip, wrist, and spine. What are the causes? The exact cause  is not known. What increases the risk? Anyone can develop osteoporosis. You may be at greater risk if you have a family history of the condition or have poor nutrition. You may also have a higher risk if you are:  Female.  20 years old or older.  A smoker.  Not physically active.  White or Asian.  Slender. What are the signs or symptoms? A fracture might be the first sign of the disease, especially if it results from a fall or injury that would not usually cause a bone to break. Other signs and symptoms include:  Low back and neck pain.  Stooped posture.  Height loss. How is this diagnosed? To make a diagnosis, your health care provider may:  Take a medical history.  Perform a physical exam.  Order tests, such as:  A bone mineral density test.  A dual-energy X-ray absorptiometry test. How is this treated? The goal of osteoporosis treatment is to strengthen your bones to reduce your risk of a fracture. Treatment may involve:  Making lifestyle changes, such as:  Eating a diet rich in calcium.  Doing weight-bearing and muscle-strengthening exercises.  Stopping tobacco use.  Limiting alcohol intake.  Taking medicine to slow the process of bone loss or to increase bone density.  Monitoring your levels of calcium and vitamin D. Follow these instructions at home:  Include calcium and vitamin D in your diet. Calcium is  important for bone health, and vitamin D helps the body absorb calcium.  Perform weight-bearing and muscle-strengthening exercises as directed by your health care provider.  Do not use any tobacco products, including cigarettes, chewing tobacco, and electronic cigarettes. If you need help quitting, ask your health care provider.  Limit your alcohol intake.  Take medicines only as directed by your health care provider.  Keep all follow-up visits as directed by your health care provider. This is important.  Take precautions at home to lower your  risk of falling, such as:  Keeping rooms well lit and clutter free.  Installing safety rails on stairs.  Using rubber mats in the bathroom and other areas that are often wet or slippery. Get help right away if: You fall or injure yourself. This information is not intended to replace advice given to you by your health care provider. Make sure you discuss any questions you have with your health care provider. Document Released: 12/16/2004 Document Revised: 08/11/2015 Document Reviewed: 08/16/2013 Elsevier Interactive Patient Education  2017 [employer]. Bone Densitometry Bone densitometry is an imaging test that uses a special X-ray to measure the amount of calcium and other minerals in your bones (bone density). This test is also known as a bone mineral density test or dual-energy X-ray absorptiometry (DXA). The test can measure bone density at your hip and your spine. It is similar to having a regular X-ray. You may have this test to:  Diagnose a condition that causes weak or thin bones (osteoporosis).  Predict your risk of a broken bone (fracture).  Determine how well osteoporosis treatment is working. Tell a health care provider about:  Any allergies you have.  All medicines you are taking, including vitamins, herbs, eye drops, creams, and over-the-counter medicines.  Any problems you or family members have had with anesthetic medicines.  Any blood disorders you have.  Any surgeries you have had.  Any medical conditions you have.  Possibility of pregnancy.  Any other medical test you had within the previous 14 days that used contrast material. What are the risks? Generally, this is a safe procedure. However, problems can occur and may include the following:  This test exposes you to a very small amount of radiation.  The risks of radiation exposure may be greater to unborn children. What happens before the procedure?  Do not take any calcium supplements for 24 hours  before having the test. You can otherwise eat and drink what you usually do.  Take off all metal jewelry, eyeglasses, dental appliances, and any other metal objects. What happens during the procedure?  You may lie on an exam table. There will be an X-ray generator below you and an imaging device above you.  Other devices, such as boxes or braces, may be used to position your body properly for the scan.  You will need to lie still while the machine slowly scans your body.  The images will show up on a computer monitor. What happens after the procedure? You may need more testing at a later time. This information is not intended to replace advice given to you by your health care provider. Make sure you discuss any questions you have with your health care provider. Document Released: 03/30/2004 Document Revised: 08/14/2015 Document Reviewed: 08/16/2013 Elsevier Interactive Patient Education  2017 [employer].
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
## 2016-07-31 LAB — VITAMIN D 25 HYDROXY (VIT D DEFICIENCY, FRACTURES): Vit D, 25-Hydroxy: 44 ng/mL (ref 30–100)

## 2016-08-02 LAB — PTH, INTACT AND CALCIUM
Calcium: 9.4 mg/dL (ref 8.6–10.4)
PTH: 28 pg/mL (ref 14–64)

## 2016-08-04 ENCOUNTER — Encounter: Payer: Self-pay | Admitting: Gynecology

## 2016-08-05 ENCOUNTER — Encounter: Payer: Self-pay | Admitting: Nurse Practitioner

## 2016-08-05 ENCOUNTER — Ambulatory Visit (INDEPENDENT_AMBULATORY_CARE_PROVIDER_SITE_OTHER): Payer: Medicare Other | Admitting: Nurse Practitioner

## 2016-08-05 ENCOUNTER — Other Ambulatory Visit: Payer: Medicare Other

## 2016-08-05 VITALS — BP 174/96 | HR 76 | Temp 98.3°F | Ht 62.0 in | Wt 107.0 lb

## 2016-08-05 DIAGNOSIS — N3001 Acute cystitis with hematuria: Secondary | ICD-10-CM

## 2016-08-05 DIAGNOSIS — R030 Elevated blood-pressure reading, without diagnosis of hypertension: Secondary | ICD-10-CM | POA: Diagnosis not present

## 2016-08-05 LAB — POCT URINALYSIS DIPSTICK
Bilirubin, UA: NEGATIVE
Glucose, UA: NEGATIVE
KETONES UA: NEGATIVE
NITRITE UA: POSITIVE
PH UA: 7 (ref 5.0–8.0)
PROTEIN UA: NEGATIVE
Spec Grav, UA: <=1.005 — AB (ref 1.010–1.025)
Urobilinogen, UA: 0.2 E.U./dL

## 2016-08-05 MED ORDER — CIPROFLOXACIN HCL 500 MG PO TABS: 500.0000 mg | ORAL_TABLET | Freq: Two times a day (BID) | ORAL | 0 refills | Status: DC

## 2016-08-05 NOTE — Progress Notes (Signed)
Subjective:  Patient ID: Joy Thompson, female    DOB: 11/03/1940  Age: 76 y.o. MRN: [id number]  CC: Urinary Tract Infection (odor, going on for 5 days. )   Urinary Tract Infection   This is a new problem. The current episode started in the past 7 days. The problem occurs every urination. The problem has been gradually worsening. The quality of the pain is described as burning. There has been no fever. She is not sexually active. There is no history of pyelonephritis. Associated symptoms include frequency. Pertinent negatives include no chills, discharge, flank pain, hematuria, hesitancy, nausea, possible pregnancy or sweats. She has tried increased fluids for the symptoms. Her past medical history is significant for recurrent UTIs and urinary stasis. There is no history of kidney stones.    Outpatient Medications Prior to Visit  Medication Sig Dispense Refill  . aspirin 81 MG EC tablet Take 81 mg by mouth daily.      . calcium-vitamin D (OSCAL WITH D 500-200) 500-200 MG-UNIT per tablet Take 1.5 tablets by mouth.     . Cholecalciferol 1000 UNITS tablet Take 1,000 Units by mouth daily.      Marland Kitchen denosumab (PROLIA) 60 MG/ML SOLN injection Inject 60 mg into the skin every 6 (six) months. Administer in upper arm, thigh, or abdomen    . Multiple Vitamins-Minerals (MULTIVITAMIN,TX-MINERALS) tablet Take 1 tablet by mouth daily.       No facility-administered medications prior to visit.     ROS See HPI  Objective:  BP (!) 174/96   Pulse 76   Temp 98.3 F (36.8 C)   Ht 5\' 2"  (1.575 m)   Wt 107 lb (48.5 kg)   SpO2 96%   BMI 19.57 kg/m   BP Readings from Last 3 Encounters:  08/05/16 (!) 174/96  07/30/16 112/78  05/28/16 (!) 142/82    Wt Readings from Last 3 Encounters:  08/05/16 107 lb (48.5 kg)  07/30/16 104 lb (47.2 kg)  05/28/16 105 lb (47.6 kg)    Physical Exam  Constitutional: She is oriented to person, place, and time. No distress.  Cardiovascular: Normal rate.     Pulmonary/Chest: Effort normal.  Abdominal: Soft. Bowel sounds are normal. She exhibits no distension. There is no tenderness.  Negative CVA tenderness  Neurological: She is alert and oriented to person, place, and time.  Skin: Skin is warm and dry.  Vitals reviewed.   Lab Results  Component Value Date   WBC 6.4 02/27/2016   HGB 14.4 02/27/2016   HCT 42.8 02/27/2016   PLT 320.0 02/27/2016   GLUCOSE 102 (H) 02/27/2016   CHOL 213 (H) 02/27/2016   TRIG 81.0 02/27/2016   HDL 81.80 02/27/2016   LDLCALC 115 (H) 02/27/2016   ALT 17 02/27/2016   AST 20 02/27/2016   NA 143 02/27/2016   K 4.0 02/27/2016   CL 103 02/27/2016   CREATININE 0.76 02/27/2016   BUN 10 02/27/2016   CO2 32 02/27/2016   TSH 2.85 02/27/2016    No results found.  Assessment & Plan:   Joy Thompson was seen today for urinary tract infection.  Diagnoses and all orders for this visit:  Acute cystitis with hematuria -     Urine culture; Future -     POCT urinalysis dipstick -     Discontinue: ciprofloxacin (CIPRO) 500 MG tablet; Take 1 tablet (500 mg total) by mouth 2 (two) times daily. -     ciprofloxacin (CIPRO) 500 MG tablet; Take 1 tablet (  500 mg total) by mouth 2 (two) times daily.  Elevated BP without diagnosis of hypertension   I am having Joy Thompson maintain her aspirin, (multivitamin,tx-minerals), calcium-vitamin D, Cholecalciferol, denosumab, and ciprofloxacin.  Meds ordered this encounter  Medications  . DISCONTD: ciprofloxacin (CIPRO) 500 MG tablet    Sig: Take 1 tablet (500 mg total) by mouth 2 (two) times daily.    Dispense:  10 tablet    Refill:  0    Order Specific Question:   Supervising Provider    Answer:   Cassandria Anger [[id number]]  . ciprofloxacin (CIPRO) 500 MG tablet    Sig: Take 1 tablet (500 mg total) by mouth 2 (two) times daily.    Dispense:  6 tablet    Refill:  0    Order Specific Question:   Supervising Provider    Answer:   Cassandria Anger [[id number]]    Follow-up:  Return if symptoms worsen or fail to improve.  Wilfred Lacy, NP

## 2016-08-05 NOTE — Patient Instructions (Addendum)
Monitor BP at home (at least once a day in morning). Call office if BP persistently greater than 140/80 for more than 2days. Bring BP machine to next OV in 2weeks with pcp.  Urinary Tract Infection, Adult A urinary tract infection (UTI) is an infection of any part of the urinary tract, which includes the kidneys, ureters, bladder, and urethra. These organs make, store, and get rid of urine in the body. UTI can be a bladder infection (cystitis) or kidney infection (pyelonephritis). What are the causes? This infection may be caused by fungi, viruses, or bacteria. Bacteria are the most common cause of UTIs. This condition can also be caused by repeated incomplete emptying of the bladder during urination. What increases the risk? This condition is more likely to develop if:  You ignore your need to urinate or hold urine for long periods of time.  You do not empty your bladder completely during urination.  You wipe back to front after urinating or having a bowel movement, if you are female.  You are uncircumcised, if you are female.  You are constipated.  You have a urinary catheter that stays in place (indwelling).  You have a weak defense (immune) system.  You have a medical condition that affects your bowels, kidneys, or bladder.  You have diabetes.  You take antibiotic medicines frequently or for long periods of time, and the antibiotics no longer work well against certain types of infections (antibiotic resistance).  You take medicines that irritate your urinary tract.  You are exposed to chemicals that irritate your urinary tract.  You are female. What are the signs or symptoms? Symptoms of this condition include:  Fever.  Frequent urination or passing small amounts of urine frequently.  Needing to urinate urgently.  Pain or burning with urination.  Urine that smells bad or unusual.  Cloudy urine.  Pain in the lower abdomen or back.  Trouble urinating.  Blood in  the urine.  Vomiting or being less hungry than normal.  Diarrhea or abdominal pain.  Vaginal discharge, if you are female. How is this diagnosed? This condition is diagnosed with a medical history and physical exam. You will also need to provide a urine sample to test your urine. Other tests may be done, including:  Blood tests.  Sexually transmitted disease (STD) testing. If you have had more than one UTI, a cystoscopy or imaging studies may be done to determine the cause of the infections. How is this treated? Treatment for this condition often includes a combination of two or more of the following:  Antibiotic medicine.  Other medicines to treat less common causes of UTI.  Over-the-counter medicines to treat pain.  Drinking enough water to stay hydrated. Follow these instructions at home:  Take over-the-counter and prescription medicines only as told by your health care provider.  If you were prescribed an antibiotic, take it as told by your health care provider. Do not stop taking the antibiotic even if you start to feel better.  Avoid alcohol, caffeine, tea, and carbonated beverages. They can irritate your bladder.  Drink enough fluid to keep your urine clear or pale yellow.  Keep all follow-up visits as told by your health care provider. This is important.  Make sure to:  Empty your bladder often and completely. Do not hold urine for long periods of time.  Empty your bladder before and after sex.  Wipe from front to back after a bowel movement if you are female. Use each tissue one time  when you wipe. Contact a health care provider if:  You have back pain.  You have a fever.  You feel nauseous or vomit.  Your symptoms do not get better after 3 days.  Your symptoms go away and then return. Get help right away if:  You have severe back pain or lower abdominal pain.  You are vomiting and cannot keep down any medicines or water. This information is not  intended to replace advice given to you by your health care provider. Make sure you discuss any questions you have with your health care provider. Document Released: 12/16/2004 Document Revised: 08/20/2015 Document Reviewed: 01/27/2015 Elsevier Interactive Patient Education  2017 [employer].

## 2016-08-05 NOTE — Telephone Encounter (Signed)
pc from pt I called her and told her that PA must be done prior to injection.

## 2016-08-07 LAB — URINE CULTURE

## 2016-08-09 MED ORDER — NITROFURANTOIN MONOHYD MACRO 100 MG PO CAPS: 100.0000 mg | ORAL_CAPSULE | Freq: Two times a day (BID) | ORAL | 0 refills | Status: AC

## 2016-08-11 NOTE — Telephone Encounter (Signed)
Pt pc on antibiotic will wait on Prolia until she finish with antibiotic per protocol. She is on  [place] for UTI was on Cipro. I will verify with Dr Toney Rakes. She is finished with meds next.  She is to be seen PCP June 8

## 2016-08-12 NOTE — Telephone Encounter (Signed)
Sounds like a good plan - thank you

## 2016-08-17 NOTE — Telephone Encounter (Signed)
Confirmed with pt no Prolia until after infection has resolved. Left VM

## 2016-08-27 ENCOUNTER — Ambulatory Visit (INDEPENDENT_AMBULATORY_CARE_PROVIDER_SITE_OTHER): Payer: Medicare Other | Admitting: Internal Medicine

## 2016-08-27 ENCOUNTER — Encounter: Payer: Self-pay | Admitting: Internal Medicine

## 2016-08-27 VITALS — BP 144/90 | HR 83 | Temp 98.4°F | Ht 62.0 in | Wt 104.0 lb

## 2016-08-27 DIAGNOSIS — M810 Age-related osteoporosis without current pathological fracture: Secondary | ICD-10-CM | POA: Diagnosis not present

## 2016-08-27 DIAGNOSIS — Z0000 Encounter for general adult medical examination without abnormal findings: Secondary | ICD-10-CM

## 2016-08-27 DIAGNOSIS — R609 Edema, unspecified: Secondary | ICD-10-CM | POA: Diagnosis not present

## 2016-08-27 DIAGNOSIS — R030 Elevated blood-pressure reading, without diagnosis of hypertension: Secondary | ICD-10-CM | POA: Diagnosis not present

## 2016-08-27 DIAGNOSIS — N3090 Cystitis, unspecified without hematuria: Secondary | ICD-10-CM

## 2016-08-27 MED ORDER — CIPROFLOXACIN HCL 250 MG PO TABS: 250.0000 mg | ORAL_TABLET | Freq: Two times a day (BID) | ORAL | 0 refills | Status: DC

## 2016-08-27 NOTE — Assessment & Plan Note (Signed)
Compr hose

## 2016-08-27 NOTE — Assessment & Plan Note (Signed)
  BP nl at home 

## 2016-08-27 NOTE — Progress Notes (Signed)
Subjective:  Patient ID: Joy Thompson, female    DOB: [birth year]  Age: 76 y.o. MRN: [id number]  CC: No chief complaint on file.   HPI BRETTA FEES presents for UTIs more frequent lately, osteoporosis. BP ok at home  Outpatient Medications Prior to Visit  Medication Sig Dispense Refill  . aspirin 81 MG EC tablet Take 81 mg by mouth daily.      . calcium-vitamin D (OSCAL WITH D 500-200) 500-200 MG-UNIT per tablet Take 1.5 tablets by mouth.     . Cholecalciferol 1000 UNITS tablet Take 1,000 Units by mouth daily.      Marland Kitchen denosumab (PROLIA) 60 MG/ML SOLN injection Inject 60 mg into the skin every 6 (six) months. Administer in upper arm, thigh, or abdomen    . Multiple Vitamins-Minerals (MULTIVITAMIN,TX-MINERALS) tablet Take 1 tablet by mouth daily.       No facility-administered medications prior to visit.     ROS Review of Systems  Constitutional: Negative for activity change, appetite change, chills, fatigue and unexpected weight change.  HENT: Negative for congestion, mouth sores and sinus pressure.   Eyes: Negative for visual disturbance.  Respiratory: Negative for cough and chest tightness.   Gastrointestinal: Negative for abdominal pain and nausea.  Genitourinary: Positive for frequency. Negative for difficulty urinating and vaginal pain.  Musculoskeletal: Negative for back pain and gait problem.  Skin: Negative for pallor and rash.  Neurological: Negative for dizziness, tremors, weakness, numbness and headaches.  Psychiatric/Behavioral: Negative for confusion, sleep disturbance and suicidal ideas.    Objective:  BP (!) 144/90 (BP Location: Left Arm, Patient Position: Sitting, Cuff Size: Normal)   Pulse 83   Temp 98.4 F (36.9 C) (Oral)   Ht 5\' 2"  (1.575 m)   Wt 104 lb (47.2 kg)   SpO2 96%   BMI 19.02 kg/m   BP Readings from Last 3 Encounters:  08/27/16 (!) 144/90  08/05/16 (!) 174/96  07/30/16 112/78    Wt Readings from Last 3 Encounters:  08/27/16 104 lb  (47.2 kg)  08/05/16 107 lb (48.5 kg)  07/30/16 104 lb (47.2 kg)    Physical Exam  Constitutional: She appears well-developed. No distress.  HENT:  Head: Normocephalic.  Right Ear: External ear normal.  Left Ear: External ear normal.  Nose: Nose normal.  Mouth/Throat: Oropharynx is clear and moist.  Eyes: Conjunctivae are normal. Pupils are equal, round, and reactive to light. Right eye exhibits no discharge. Left eye exhibits no discharge.  Neck: Normal range of motion. Neck supple. No JVD present. No tracheal deviation present. No thyromegaly present.  Cardiovascular: Normal rate, regular rhythm and normal heart sounds.   Pulmonary/Chest: No stridor. No respiratory distress. She has no wheezes.  Abdominal: Soft. Bowel sounds are normal. She exhibits no distension and no mass. There is no tenderness. There is no rebound and no guarding.  Musculoskeletal: She exhibits no edema or tenderness.  Lymphadenopathy:    She has no cervical adenopathy.  Neurological: She displays normal reflexes. No cranial nerve deficit. She exhibits normal muscle tone. Coordination normal.  Skin: No rash noted. No erythema.  Psychiatric: She has a normal mood and affect. Her behavior is normal. Judgment and thought content normal.  thin  Lab Results  Component Value Date   WBC 6.4 02/27/2016   HGB 14.4 02/27/2016   HCT 42.8 02/27/2016   PLT 320.0 02/27/2016   GLUCOSE 102 (H) 02/27/2016   CHOL 213 (H) 02/27/2016   TRIG 81.0 02/27/2016   HDL  81.80 02/27/2016   LDLCALC 115 (H) 02/27/2016   ALT 17 02/27/2016   AST 20 02/27/2016   NA 143 02/27/2016   K 4.0 02/27/2016   CL 103 02/27/2016   CREATININE 0.76 02/27/2016   BUN 10 02/27/2016   CO2 32 02/27/2016   TSH 2.85 02/27/2016    No results found.  Assessment & Plan:   There are no diagnoses linked to this encounter. I am having Ms. Krock maintain her aspirin, (multivitamin,tx-minerals), calcium-vitamin D, Cholecalciferol, and denosumab.  No  orders of the defined types were placed in this encounter.    Follow-up: No Follow-up on file.  Walker Kehr, MD

## 2016-08-27 NOTE — Assessment & Plan Note (Signed)
Prolia DEXA

## 2016-08-27 NOTE — Patient Instructions (Signed)

## 2016-08-27 NOTE — Assessment & Plan Note (Signed)
Recurrent  - Cipro prn

## 2016-08-30 NOTE — Telephone Encounter (Addendum)
Joy Thompson  Has had recurrent UTI  She would like a second opinion with Dr Toney Rakes and she would like to have her Prolia the same day.  Compelete exam JF 07/30/16. Calcium 9.4 07/30/16  . Deductible $290 (221 met), Co Insurance 20% Medicare allowable [id number] $1065  [id number] 19.62. Total for pt$ 285.92. She maynot have a balance on deductible due to PCP OV. Appointment 09/16/16

## 2016-09-06 ENCOUNTER — Ambulatory Visit (INDEPENDENT_AMBULATORY_CARE_PROVIDER_SITE_OTHER)
Admission: RE | Admit: 2016-09-06 | Discharge: 2016-09-06 | Disposition: A | Discharge status: Home / Self Care | Payer: Medicare Other | Source: Ambulatory Visit | Attending: Internal Medicine | Admitting: Internal Medicine

## 2016-09-06 DIAGNOSIS — M810 Age-related osteoporosis without current pathological fracture: Secondary | ICD-10-CM | POA: Diagnosis not present

## 2016-09-07 ENCOUNTER — Inpatient Hospital Stay: Admission: RE | Admit: 2016-09-07 | Payer: Medicare Other | Source: Ambulatory Visit

## 2016-09-09 ENCOUNTER — Encounter: Payer: Self-pay | Admitting: Anesthesiology

## 2016-09-16 ENCOUNTER — Ambulatory Visit (INDEPENDENT_AMBULATORY_CARE_PROVIDER_SITE_OTHER): Payer: Medicare Other | Admitting: Gynecology

## 2016-09-16 ENCOUNTER — Encounter: Payer: Self-pay | Admitting: Gynecology

## 2016-09-16 VITALS — BP 140/80

## 2016-09-16 DIAGNOSIS — M810 Age-related osteoporosis without current pathological fracture: Secondary | ICD-10-CM | POA: Diagnosis not present

## 2016-09-16 DIAGNOSIS — Z87440 Personal history of urinary (tract) infections: Secondary | ICD-10-CM

## 2016-09-16 MED ORDER — DENOSUMAB 60 MG/ML ~~LOC~~ SOLN
60.0000 mg | Freq: Once | SUBCUTANEOUS | Status: AC
  Administered 2016-09-16: 60 mg via SUBCUTANEOUS
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
## 2016-09-21 NOTE — Telephone Encounter (Signed)
Prolia given 09/16/16 next injection due 03/19/17

## 2016-09-23 LAB — HM MAMMOGRAPHY

## 2016-09-28 ENCOUNTER — Encounter: Payer: Self-pay | Admitting: Internal Medicine

## 2016-09-28 NOTE — Progress Notes (Signed)
Abstracted and sent to scan  

## 2016-10-06 ENCOUNTER — Ambulatory Visit (INDEPENDENT_AMBULATORY_CARE_PROVIDER_SITE_OTHER): Payer: Medicare Other | Admitting: Internal Medicine

## 2016-10-06 ENCOUNTER — Encounter: Payer: Self-pay | Admitting: Internal Medicine

## 2016-10-06 DIAGNOSIS — M810 Age-related osteoporosis without current pathological fracture: Secondary | ICD-10-CM | POA: Diagnosis not present

## 2016-10-06 DIAGNOSIS — R030 Elevated blood-pressure reading, without diagnosis of hypertension: Secondary | ICD-10-CM | POA: Diagnosis not present

## 2016-10-06 DIAGNOSIS — N3090 Cystitis, unspecified without hematuria: Secondary | ICD-10-CM | POA: Diagnosis not present

## 2016-10-06 MED ORDER — ZOSTER VAC RECOMB ADJUVANTED 50 MCG/0.5ML IM SUSR: 0.5000 mL | Freq: Once | INTRAMUSCULAR | 1 refills | Status: AC

## 2016-10-06 NOTE — Assessment & Plan Note (Signed)
Better on Prolia

## 2016-10-06 NOTE — Assessment & Plan Note (Signed)
On Cranberry tablets

## 2016-10-06 NOTE — Assessment & Plan Note (Signed)
Nl BP at home - reviewed

## 2016-10-06 NOTE — Progress Notes (Signed)
Subjective:  Patient ID: Joy Thompson, female    DOB: 04/13/1940  Age: 76 y.o. MRN: [id number]  CC: No chief complaint on file.   HPI Joy Thompson presents for abn BDS, cystitis, elevated BP f/u. BDS is better  Outpatient Medications Prior to Visit  Medication Sig Dispense Refill  . aspirin 81 MG EC tablet Take 81 mg by mouth daily.      . calcium-vitamin D (OSCAL WITH D 500-200) 500-200 MG-UNIT per tablet Take 1.5 tablets by mouth.     . Cholecalciferol 1000 UNITS tablet Take 1,000 Units by mouth daily.      Marland Kitchen CRANBERRY PO Take 1 tablet by mouth daily.    Marland Kitchen denosumab (PROLIA) 60 MG/ML SOLN injection Inject 60 mg into the skin every 6 (six) months. Administer in upper arm, thigh, or abdomen    . Multiple Vitamins-Minerals (MULTIVITAMIN,TX-MINERALS) tablet Take 1 tablet by mouth daily.       No facility-administered medications prior to visit.     ROS Review of Systems  Constitutional: Negative for activity change, appetite change, chills, fatigue and unexpected weight change.  HENT: Negative for congestion, mouth sores and sinus pressure.   Eyes: Negative for visual disturbance.  Respiratory: Negative for cough and chest tightness.   Gastrointestinal: Negative for abdominal pain and nausea.  Genitourinary: Positive for urgency. Negative for difficulty urinating, frequency and vaginal pain.  Musculoskeletal: Negative for back pain and gait problem.  Skin: Negative for pallor and rash.  Neurological: Negative for dizziness, tremors, weakness, numbness and headaches.  Psychiatric/Behavioral: Negative for confusion and sleep disturbance.    Objective:  BP (!) 142/84 (BP Location: Left Arm, Patient Position: Sitting, Cuff Size: Normal)   Pulse 78   Temp 98.4 F (36.9 C) (Oral)   Ht 5\' 2"  (4.174 m)   Wt 103 lb (46.7 kg)   SpO2 94%   BMI 18.84 kg/m   BP Readings from Last 3 Encounters:  10/06/16 (!) 142/84  09/16/16 140/80  08/27/16 (!) 144/90    Wt Readings from  Last 3 Encounters:  10/06/16 103 lb (46.7 kg)  08/27/16 104 lb (47.2 kg)  08/05/16 107 lb (48.5 kg)    Physical Exam  Constitutional: She appears well-developed. No distress.  HENT:  Head: Normocephalic.  Right Ear: External ear normal.  Left Ear: External ear normal.  Nose: Nose normal.  Mouth/Throat: Oropharynx is clear and moist.  Eyes: Pupils are equal, round, and reactive to light. Conjunctivae are normal. Right eye exhibits no discharge. Left eye exhibits no discharge.  Neck: Normal range of motion. Neck supple. No JVD present. No tracheal deviation present. No thyromegaly present.  Cardiovascular: Normal rate, regular rhythm and normal heart sounds.   Pulmonary/Chest: No stridor. No respiratory distress. She has no wheezes.  Abdominal: Soft. Bowel sounds are normal. She exhibits no distension and no mass. There is no tenderness. There is no rebound and no guarding.  Musculoskeletal: She exhibits no edema or tenderness.  Lymphadenopathy:    She has no cervical adenopathy.  Neurological: She displays normal reflexes. No cranial nerve deficit. She exhibits normal muscle tone. Coordination normal.  Skin: No rash noted. No erythema.  Psychiatric: She has a normal mood and affect. Her behavior is normal. Judgment and thought content normal.    Lab Results  Component Value Date   WBC 6.4 02/27/2016   HGB 14.4 02/27/2016   HCT 42.8 02/27/2016   PLT 320.0 02/27/2016   GLUCOSE 102 (H) 02/27/2016   CHOL  213 (H) 02/27/2016   TRIG 81.0 02/27/2016   HDL 81.80 02/27/2016   LDLCALC 115 (H) 02/27/2016   ALT 17 02/27/2016   AST 20 02/27/2016   NA 143 02/27/2016   K 4.0 02/27/2016   CL 103 02/27/2016   CREATININE 0.76 02/27/2016   BUN 10 02/27/2016   CO2 32 02/27/2016   TSH 2.85 02/27/2016    Dg Bone Density  Result Date: 09/11/2016 Date of study: 09/06/16 Exam: DUAL X-RAY ABSORPTIOMETRY (DXA) FOR BONE MINERAL DENSITY (BMD) Instrument: [employer] Provider:  PCP Indication: screening for osteoporosis Comparison: none (please note that it is not possible to compare data from different instruments) Clinical data: Pt is a 76 y.o. female with history of fracture. Results:  Lumbar spine (L1-L4) Femoral neck (FN) 33% distal radius T-score -1.8 RFN: - 2.5 LFN: - 2.3 n/a Change in BMD from previous DXA test (%) Up 10.6% Up 2.2% n/a (*) statistically significant Assessment: Patient has OSTEOPOROSIS according to the [facility] classification for osteoporosis (see below). Fracture risk: high Comments: the technical quality of the study is good Evaluation for secondary causes should be considered if clinically indicated. Recommend optimizing calcium (1200 mg/day) and vitamin D (800 IU/day). Followup: Repeat BMD is appropriate after 2 years or after 1-2 years if starting treatment. WHO criteria for diagnosis of osteoporosis in postmenopausal women and in men 35 y/o or older: - normal: T-score -1.0 to + 1.0 - osteopenia/low bone density: T-score between -2.5 and -1.0 - osteoporosis: T-score below -2.5 - severe osteoporosis: T-score below -2.5 with history of fragility fracture Note: although not part of the WHO classification, the presence of a fragility fracture, regardless of the T-score, should be considered diagnostic of osteoporosis, provided other causes for the fracture have been excluded. Treatment: The National Osteoporosis Foundation recommends that treatment be considered in postmenopausal women and men age 34 or older with: 1. Hip or vertebral (clinical or morphometric) fracture 2. T-score of - 2.5 or lower at the spine or hip 3. 10-year fracture probability by FRAX of at least 20% for a major osteoporotic fracture and 3% for a hip fracture Loura Pardon MD    Assessment & Plan:   There are no diagnoses linked to this encounter. I am having Joy Thompson maintain her aspirin, (multivitamin,tx-minerals), calcium-vitamin D, Cholecalciferol, denosumab, and CRANBERRY PO.  No  orders of the defined types were placed in this encounter.    Follow-up: No Follow-up on file.  Walker Kehr, MD

## 2016-10-07 ENCOUNTER — Encounter: Payer: Self-pay | Admitting: Internal Medicine

## 2017-01-08 ENCOUNTER — Ambulatory Visit: Payer: Medicare Other

## 2017-01-08 ENCOUNTER — Ambulatory Visit (INDEPENDENT_AMBULATORY_CARE_PROVIDER_SITE_OTHER): Payer: Medicare Other

## 2017-01-08 DIAGNOSIS — Z23 Encounter for immunization: Secondary | ICD-10-CM

## 2017-01-10 ENCOUNTER — Telehealth: Payer: Self-pay | Admitting: *Deleted

## 2017-01-10 ENCOUNTER — Ambulatory Visit (INDEPENDENT_AMBULATORY_CARE_PROVIDER_SITE_OTHER): Payer: Medicare Other | Admitting: Gynecology

## 2017-01-10 ENCOUNTER — Encounter: Payer: Self-pay | Admitting: Gynecology

## 2017-01-10 VITALS — BP 136/84

## 2017-01-10 DIAGNOSIS — N63 Unspecified lump in unspecified breast: Secondary | ICD-10-CM | POA: Diagnosis not present

## 2017-01-10 NOTE — Telephone Encounter (Signed)
Pt informed,  order faxed

## 2017-01-10 NOTE — Patient Instructions (Signed)
[facility] should call you to arrange for the ultrasound of the breast. They may do a diagnostic mammogram also. If you do not hear from them within 1-2 weeks call my office.

## 2017-01-10 NOTE — Telephone Encounter (Signed)
Patient scheduled on 01/20/17 @ 8:45am at [facility] left message for pt to call.

## 2017-01-10 NOTE — Progress Notes (Signed)
    LAURALI GODDARD Mar 07, 1941 [id number]        76 y.o.  [id number] presents complaining of pea-sized nodule lateral aspect of the right breast noticed this past weekend. No history of same before. Not tender. Recent screening mammogram July normal.  Past medical history,surgical history, problem list, medications, allergies, family history and social history were all reviewed and documented in the EPIC chart.  Directed ROS with pertinent positives and negatives documented in the history of present illness/assessment and plan.  Exam: [name] assistant Vitals:   01/10/17 1119  BP: 136/84   General appearance:  Normal Both breast examined lying and sitting without masses retractions discharge adenopathy. Area patient's pointing to is at the 9:00 position periphery of the breast.  Assessment/Plan:  76 y.o. [id number] with patient perceived nodule right breast with normal physician exam. Recent normal mammogram in July.  Recommend starting with ultrasound and radiologist's review of the mammogram. Possible diagnostic mammogram at the radiologist's discretion.  Patient knows to expect a call to arrange this and will notify our office if she does not hear within 1-2 weeks.    Anastasio Auerbach MD, 11:45 AM 01/10/2017

## 2017-01-10 NOTE — Telephone Encounter (Signed)
-----   Message from Anastasio Auerbach, MD sent at 01/10/2017 11:47 AM EDT ----- At [facility] arrange ultrasound and possible diagnostic mammography at radiologist's discretion noting recent screening mammogram in July reference patient perceive nodule 9:00 position right breast periphery. Physician exam is normal.

## 2017-01-20 ENCOUNTER — Encounter: Payer: Self-pay | Admitting: Gynecology

## 2017-01-26 ENCOUNTER — Encounter: Payer: Self-pay | Admitting: Gynecology

## 2017-02-22 ENCOUNTER — Telehealth: Payer: Self-pay

## 2017-02-22 ENCOUNTER — Encounter: Payer: Self-pay | Admitting: Internal Medicine

## 2017-02-22 ENCOUNTER — Ambulatory Visit: Payer: Medicare Other | Admitting: Internal Medicine

## 2017-02-22 ENCOUNTER — Ambulatory Visit (INDEPENDENT_AMBULATORY_CARE_PROVIDER_SITE_OTHER): Payer: Medicare Other | Admitting: Internal Medicine

## 2017-02-22 ENCOUNTER — Other Ambulatory Visit (INDEPENDENT_AMBULATORY_CARE_PROVIDER_SITE_OTHER): Payer: Medicare Other

## 2017-02-22 VITALS — BP 142/88 | HR 89 | Temp 98.9°F | Ht 62.0 in | Wt 100.0 lb

## 2017-02-22 DIAGNOSIS — Z0000 Encounter for general adult medical examination without abnormal findings: Secondary | ICD-10-CM | POA: Diagnosis not present

## 2017-02-22 DIAGNOSIS — M810 Age-related osteoporosis without current pathological fracture: Secondary | ICD-10-CM

## 2017-02-22 DIAGNOSIS — Z23 Encounter for immunization: Secondary | ICD-10-CM

## 2017-02-22 LAB — CBC WITH DIFFERENTIAL/PLATELET
BASOS ABS: 0.1 10*3/uL (ref 0.0–0.1)
Basophils Relative: 0.7 % (ref 0.0–3.0)
EOS ABS: 0.1 10*3/uL (ref 0.0–0.7)
Eosinophils Relative: 1.8 % (ref 0.0–5.0)
HEMATOCRIT: 40.8 % (ref 36.0–46.0)
HEMOGLOBIN: 13.5 g/dL (ref 12.0–15.0)
LYMPHS ABS: 1.2 10*3/uL (ref 0.7–4.0)
Lymphocytes Relative: 16 % (ref 12.0–46.0)
MCHC: 33.2 g/dL (ref 30.0–36.0)
MCV: 90.9 fl (ref 78.0–100.0)
MONO ABS: 0.7 10*3/uL (ref 0.1–1.0)
Monocytes Relative: 9.9 % (ref 3.0–12.0)
NEUTROS ABS: 5.4 10*3/uL (ref 1.4–7.7)
Neutrophils Relative %: 71.6 % (ref 43.0–77.0)
PLATELETS: 256 10*3/uL (ref 150.0–400.0)
RBC: 4.49 Mil/uL (ref 3.87–5.11)
RDW: 13.1 % (ref 11.5–15.5)
WBC: 7.5 10*3/uL (ref 4.0–10.5)

## 2017-02-22 LAB — LIPID PANEL
Cholesterol: 186 mg/dL (ref 0–200)
HDL: 79.4 mg/dL (ref >39.00)
LDL CALC: 93 mg/dL (ref 0–99)
NONHDL: 106.16
Total CHOL/HDL Ratio: 2
Triglycerides: 67 mg/dL (ref 0.0–149.0)
VLDL: 13.4 mg/dL (ref 0.0–40.0)

## 2017-02-22 LAB — BASIC METABOLIC PANEL
BUN: 11 mg/dL (ref 6–23)
CHLORIDE: 101 meq/L (ref 96–112)
CO2: 31 mEq/L (ref 19–32)
Calcium: 9.9 mg/dL (ref 8.4–10.5)
Creatinine, Ser: 0.73 mg/dL (ref 0.40–1.20)
GFR: 82.27 mL/min (ref >60.00)
GLUCOSE: 109 mg/dL — AB (ref 70–99)
POTASSIUM: 3.8 meq/L (ref 3.5–5.1)
SODIUM: 140 meq/L (ref 135–145)

## 2017-02-22 LAB — URINALYSIS, ROUTINE W REFLEX MICROSCOPIC
Bilirubin Urine: NEGATIVE
Hgb urine dipstick: NEGATIVE
KETONES UR: NEGATIVE
NITRITE: POSITIVE — AB
PH: 5.5 (ref 5.0–8.0)
RBC / HPF: NONE SEEN (ref 0–?)
Specific Gravity, Urine: <=1.005 — AB (ref 1.000–1.030)
TOTAL PROTEIN, URINE-UPE24: NEGATIVE
Urine Glucose: NEGATIVE
Urobilinogen, UA: 0.2 (ref 0.0–1.0)

## 2017-02-22 LAB — HEPATIC FUNCTION PANEL
ALBUMIN: 4.5 g/dL (ref 3.5–5.2)
ALK PHOS: 68 U/L (ref 39–117)
ALT: 21 U/L (ref 0–35)
AST: 22 U/L (ref 0–37)
Bilirubin, Direct: 0.1 mg/dL (ref 0.0–0.3)
TOTAL PROTEIN: 7.1 g/dL (ref 6.0–8.3)
Total Bilirubin: 0.5 mg/dL (ref 0.2–1.2)

## 2017-02-22 LAB — TSH: TSH: 3.99 u[IU]/mL (ref 0.35–4.50)

## 2017-02-22 NOTE — Patient Instructions (Signed)
Health Maintenance for Postmenopausal Women Menopause is a normal process in which your reproductive ability comes to an end. This process happens gradually over a span of months to years, usually between the ages of 22 and 9. Menopause is complete when you have missed 12 consecutive menstrual periods. It is important to talk with your health care provider about some of the most common conditions that affect postmenopausal women, such as heart disease, cancer, and bone loss (osteoporosis). Adopting a healthy lifestyle and getting preventive care can help to promote your health and wellness. Those actions can also lower your chances of developing some of these common conditions. What should I know about menopause? During menopause, you may experience a number of symptoms, such as:  Moderate-to-severe hot flashes.  Night sweats.  Decrease in sex drive.  Mood swings.  Headaches.  Tiredness.  Irritability.  Memory problems.  Insomnia.  Choosing to treat or not to treat menopausal changes is an individual decision that you make with your health care provider. What should I know about hormone replacement therapy and supplements? Hormone therapy products are effective for treating symptoms that are associated with menopause, such as hot flashes and night sweats. Hormone replacement carries certain risks, especially as you become older. If you are thinking about using estrogen or estrogen with progestin treatments, discuss the benefits and risks with your health care provider. What should I know about heart disease and stroke? Heart disease, heart attack, and stroke become more likely as you age. This may be due, in part, to the hormonal changes that your body experiences during menopause. These can affect how your body processes dietary fats, triglycerides, and cholesterol. Heart attack and stroke are both medical emergencies. There are many things that you can do to help prevent heart disease  and stroke:  Have your blood pressure checked at least every 1-2 years. High blood pressure causes heart disease and increases the risk of stroke.  If you are 53-22 years old, ask your health care provider if you should take aspirin to prevent a heart attack or a stroke.  Do not use any tobacco products, including cigarettes, chewing tobacco, or electronic cigarettes. If you need help quitting, ask your health care provider.  It is important to eat a healthy diet and maintain a healthy weight. ? Be sure to include plenty of vegetables, fruits, low-fat dairy products, and lean protein. ? Avoid eating foods that are high in solid fats, added sugars, or salt (sodium).  Get regular exercise. This is one of the most important things that you can do for your health. ? Try to exercise for at least 150 minutes each week. The type of exercise that you do should increase your heart rate and make you sweat. This is known as moderate-intensity exercise. ? Try to do strengthening exercises at least twice each week. Do these in addition to the moderate-intensity exercise.  Know your numbers.Ask your health care provider to check your cholesterol and your blood glucose. Continue to have your blood tested as directed by your health care provider.  What should I know about cancer screening? There are several types of cancer. Take the following steps to reduce your risk and to catch any cancer development as early as possible. Breast Cancer  Practice breast self-awareness. ? This means understanding how your breasts normally appear and feel. ? It also means doing regular breast self-exams. Let your health care provider know about any changes, no matter how small.  If you are 40  or older, have a clinician do a breast exam (clinical breast exam or CBE) every year. Depending on your age, family history, and medical history, it may be recommended that you also have a yearly breast X-ray (mammogram).  If you  have a family history of breast cancer, talk with your health care provider about genetic screening.  If you are at high risk for breast cancer, talk with your health care provider about having an MRI and a mammogram every year.  Breast cancer (BRCA) gene test is recommended for women who have family members with BRCA-related cancers. Results of the assessment will determine the need for genetic counseling and BRCA1 and for BRCA2 testing. BRCA-related cancers include these types: ? Breast. This occurs in males or females. ? Ovarian. ? Tubal. This may also be called fallopian tube cancer. ? Cancer of the abdominal or pelvic lining (peritoneal cancer). ? Prostate. ? Pancreatic.  Cervical, Uterine, and Ovarian Cancer Your health care provider may recommend that you be screened regularly for cancer of the pelvic organs. These include your ovaries, uterus, and vagina. This screening involves a pelvic exam, which includes checking for microscopic changes to the surface of your cervix (Pap test).  For women ages 21-65, health care providers may recommend a pelvic exam and a Pap test every three years. For women ages 79-65, they may recommend the Pap test and pelvic exam, combined with testing for human papilloma virus (HPV), every five years. Some types of HPV increase your risk of cervical cancer. Testing for HPV may also be done on women of any age who have unclear Pap test results.  Other health care providers may not recommend any screening for nonpregnant women who are considered low risk for pelvic cancer and have no symptoms. Ask your health care provider if a screening pelvic exam is right for you.  If you have had past treatment for cervical cancer or a condition that could lead to cancer, you need Pap tests and screening for cancer for at least 20 years after your treatment. If Pap tests have been discontinued for you, your risk factors (such as having a new sexual partner) need to be  reassessed to determine if you should start having screenings again. Some women have medical problems that increase the chance of getting cervical cancer. In these cases, your health care provider may recommend that you have screening and Pap tests more often.  If you have a family history of uterine cancer or ovarian cancer, talk with your health care provider about genetic screening.  If you have vaginal bleeding after reaching menopause, tell your health care provider.  There are currently no reliable tests available to screen for ovarian cancer.  Lung Cancer Lung cancer screening is recommended for adults 69-62 years old who are at high risk for lung cancer because of a history of smoking. A yearly low-dose CT scan of the lungs is recommended if you:  Currently smoke.  Have a history of at least 30 pack-years of smoking and you currently smoke or have quit within the past 15 years. A pack-year is smoking an average of one pack of cigarettes per day for one year.  Yearly screening should:  Continue until it has been 15 years since you quit.  Stop if you develop a health problem that would prevent you from having lung cancer treatment.  Colorectal Cancer  This type of cancer can be detected and can often be prevented.  Routine colorectal cancer screening usually begins at  age 42 and continues through age 45.  If you have risk factors for colon cancer, your health care provider may recommend that you be screened at an earlier age.  If you have a family history of colorectal cancer, talk with your health care provider about genetic screening.  Your health care provider may also recommend using home test kits to check for hidden blood in your stool.  A small camera at the end of a tube can be used to examine your colon directly (sigmoidoscopy or colonoscopy). This is done to check for the earliest forms of colorectal cancer.  Direct examination of the colon should be repeated every  5-10 years until age 71. However, if early forms of precancerous polyps or small growths are found or if you have a family history or genetic risk for colorectal cancer, you may need to be screened more often.  Skin Cancer  Check your skin from head to toe regularly.  Monitor any moles. Be sure to tell your health care provider: ? About any new moles or changes in moles, especially if there is a change in a mole's shape or color. ? If you have a mole that is larger than the size of a pencil eraser.  If any of your family members has a history of skin cancer, especially at a young age, talk with your health care provider about genetic screening.  Always use sunscreen. Apply sunscreen liberally and repeatedly throughout the day.  Whenever you are outside, protect yourself by wearing long sleeves, pants, a wide-brimmed hat, and sunglasses.  What should I know about osteoporosis? Osteoporosis is a condition in which bone destruction happens more quickly than new bone creation. After menopause, you may be at an increased risk for osteoporosis. To help prevent osteoporosis or the bone fractures that can happen because of osteoporosis, the following is recommended:  If you are 46-71 years old, get at least 1,000 mg of calcium and at least 600 mg of vitamin D per day.  If you are older than age 55 but younger than age 65, get at least 1,200 mg of calcium and at least 600 mg of vitamin D per day.  If you are older than age 54, get at least 1,200 mg of calcium and at least 800 mg of vitamin D per day.  Smoking and excessive alcohol intake increase the risk of osteoporosis. Eat foods that are rich in calcium and vitamin D, and do weight-bearing exercises several times each week as directed by your health care provider. What should I know about how menopause affects my mental health? Depression may occur at any age, but it is more common as you become older. Common symptoms of depression  include:  Low or sad mood.  Changes in sleep patterns.  Changes in appetite or eating patterns.  Feeling an overall lack of motivation or enjoyment of activities that you previously enjoyed.  Frequent crying spells.  Talk with your health care provider if you think that you are experiencing depression. What should I know about immunizations? It is important that you get and maintain your immunizations. These include:  Tetanus, diphtheria, and pertussis (Tdap) booster vaccine.  Influenza every year before the flu season begins.  Pneumonia vaccine.  Shingles vaccine.  Your health care provider may also recommend other immunizations. This information is not intended to replace advice given to you by your health care provider. Make sure you discuss any questions you have with your health care provider. Document Released: 04/30/2005  Document Revised: 09/26/2015 Document Reviewed: 12/10/2014 Elsevier Interactive Patient Education  2018 [employer]  

## 2017-02-22 NOTE — Addendum Note (Signed)
Addended by: Karren Cobble on: 02/22/2017 10:53 AM   Modules accepted: Orders
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
## 2017-02-22 NOTE — Assessment & Plan Note (Signed)
Prolia Vit D 

## 2017-02-22 NOTE — Telephone Encounter (Signed)
Order [id number]

## 2017-02-22 NOTE — Assessment & Plan Note (Signed)
Here for medicare wellness/physical  Diet: heart healthy  Physical activity: not sedentary  Depression/mood screen: negative  Hearing: intact to whispered voice  Visual acuity: grossly normal, performs annual eye exam. Reading glasses.  ADLs: capable  Fall risk: low to none  Home safety: good  Cognitive evaluation: intact to orientation, naming, recall and repetition  EOL planning: adv directives, full code/ I agree  I have personally reviewed and have noted  1. The patient's medical, surgical and social history  2. Their use of alcohol, tobacco or illicit drugs  3. Their current medications and supplements  4. The patient's functional ability including ADL's, fall risks, home safety risks and hearing or visual impairment.  5. Diet and physical activities  6. Evidence for depression or mood disorders 7. The roster of all physicians providing medical care to patient - is listed in the Snapshot section of the chart and reviewed today.    Today patient counseled on age appropriate routine health concerns for screening and prevention, each reviewed and up to date or declined. Immunizations reviewed and up to date or declined. Labs ordered and reviewed. Risk factors for depression reviewed and negative. Hearing function and visual acuity are intact. ADLs screened and addressed as needed. Functional ability and level of safety reviewed and appropriate. Education, counseling and referrals performed based on assessed risks today. Patient provided with a copy of personalized plan for preventive services.  PAP/mammo - Dr Phineas Real Cologuard Pneumovax

## 2017-02-23 ENCOUNTER — Other Ambulatory Visit: Payer: Self-pay | Admitting: Nurse Practitioner

## 2017-02-23 DIAGNOSIS — N3001 Acute cystitis with hematuria: Secondary | ICD-10-CM

## 2017-02-24 ENCOUNTER — Telehealth: Payer: Self-pay | Admitting: Gynecology

## 2017-02-24 NOTE — Telephone Encounter (Addendum)
Complete Exam 09/16/16 Calcium 9.9 02/22/17   Deductible MET ($290) OOP MAX $3887($195 MET) Allowable 1084.62 One dose of Prolia 20%=$216 Administration 20%=$12 ([facility] charges $60 for admin fee 20% is 12) Co pay/coinsurance $0.00  Estimated cost for pt $228  Called pt left voice mail To schedule Prolia injection avalibe date12/31/18 before insurance updates for 2019   Pt called back appointment 03/21/17 at 10am

## 2017-03-21 ENCOUNTER — Ambulatory Visit (INDEPENDENT_AMBULATORY_CARE_PROVIDER_SITE_OTHER): Payer: Medicare Other | Admitting: Gynecology

## 2017-03-21 ENCOUNTER — Encounter: Payer: Self-pay | Admitting: Gynecology

## 2017-03-21 DIAGNOSIS — M810 Age-related osteoporosis without current pathological fracture: Secondary | ICD-10-CM

## 2017-03-21 MED ORDER — DENOSUMAB 60 MG/ML ~~LOC~~ SOLN
60.0000 mg | Freq: Once | SUBCUTANEOUS | Status: AC
  Administered 2017-03-21: 60 mg via SUBCUTANEOUS

## 2017-03-21 NOTE — Telephone Encounter (Addendum)
Prolia given 03/21/17 Next injection 08/20/17

## 2017-03-23 LAB — COLOGUARD: COLOGUARD: POSITIVE

## 2017-03-24 ENCOUNTER — Telehealth: Payer: Self-pay

## 2017-03-24 DIAGNOSIS — K921 Melena: Secondary | ICD-10-CM

## 2017-03-24 NOTE — Telephone Encounter (Signed)
Referral placed for GI  Copied from [place] #[id number]. Topic: Inquiry >> Mar 24, 2017  1:49 PM Pricilla Handler wrote: Reason for CRM: Sarah with [facility] [phone]) called wanting to know if we received patient's Cologuard results. Results are Abnormal. Sarah requests a call if the results are not available to our office.       Thank You!!!

## 2017-04-04 ENCOUNTER — Ambulatory Visit: Payer: Medicare Other | Admitting: Physician Assistant

## 2017-04-12 ENCOUNTER — Encounter: Payer: Self-pay | Admitting: Physician Assistant

## 2017-04-12 ENCOUNTER — Ambulatory Visit (INDEPENDENT_AMBULATORY_CARE_PROVIDER_SITE_OTHER): Payer: Medicare Other | Admitting: Physician Assistant

## 2017-04-12 VITALS — BP 164/92 | HR 76 | Ht 62.0 in | Wt 103.6 lb

## 2017-04-12 DIAGNOSIS — R195 Other fecal abnormalities: Secondary | ICD-10-CM

## 2017-04-12 DIAGNOSIS — Z9889 Other specified postprocedural states: Secondary | ICD-10-CM | POA: Insufficient documentation

## 2017-04-12 DIAGNOSIS — Z1211 Encounter for screening for malignant neoplasm of colon: Secondary | ICD-10-CM
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
## 2017-04-12 NOTE — Patient Instructions (Signed)
You have been scheduled for a colonoscopy. Please follow written instructions given to you at your visit today.  Please pick up your prep supplies at the pharmacy within the next 1-3 days. If you use inhalers (even only as needed), please bring them with you on the day of your procedure.  If you are age 77 or older, your body mass index should be between 23-30. Your Body mass index is 18.95 kg/m. If this is out of the aforementioned range listed, please consider follow up with your Primary Care Provider.

## 2017-04-16 NOTE — Telephone Encounter (Signed)
test was positive, pt notified and GI referral was made

## 2017-04-19 ENCOUNTER — Ambulatory Visit (AMBULATORY_SURGERY_CENTER): Payer: Medicare Other | Admitting: Internal Medicine

## 2017-04-19 ENCOUNTER — Other Ambulatory Visit: Payer: Self-pay

## 2017-04-19 ENCOUNTER — Encounter: Payer: Self-pay | Admitting: Internal Medicine

## 2017-04-19 VITALS — BP 152/67 | HR 62 | Temp 96.0°F | Resp 14 | Ht 62.0 in | Wt 103.0 lb

## 2017-04-19 DIAGNOSIS — R195 Other fecal abnormalities: Secondary | ICD-10-CM

## 2017-04-19 MED ORDER — SODIUM CHLORIDE 0.9 % IV SOLN: 500.0000 mL | Freq: Once | INTRAVENOUS | Status: DC

## 2017-04-19 NOTE — Progress Notes (Signed)
A/ox3 pleased with MAC, report to Meg RN 

## 2017-04-19 NOTE — Patient Instructions (Addendum)
   No polyps or cancer seen. It is likely that some blood from hemorrhoids turned the Cologuard positive.  You do not need any other colon cancer screening tests going forward.  I appreciate the opportunity to care for you. Gatha Mayer, MD, [facility]  Information on hemorrhoids and diverticulosis given.  YOU HAD AN ENDOSCOPIC PROCEDURE TODAY AT [facility]:   Refer to the procedure report that was given to you for any specific questions about what was found during the examination.  If the procedure report does not answer your questions, please call your gastroenterologist to clarify.  If you requested that your care partner not be given the details of your procedure findings, then the procedure report has been included in a sealed envelope for you to review at your convenience later.  YOU SHOULD EXPECT: Some feelings of bloating in the abdomen. Passage of more gas than usual.  Walking can help get rid of the air that was put into your GI tract during the procedure and reduce the bloating. If you had a lower endoscopy (such as a colonoscopy or flexible sigmoidoscopy) you may notice spotting of blood in your stool or on the toilet paper. If you underwent a bowel prep for your procedure, you may not have a normal bowel movement for a few days.  Please Note:  You might notice some irritation and congestion in your nose or some drainage.  This is from the oxygen used during your procedure.  There is no need for concern and it should clear up in a day or so.  SYMPTOMS TO REPORT IMMEDIATELY:   Following lower endoscopy (colonoscopy or flexible sigmoidoscopy):  Excessive amounts of blood in the stool  Significant tenderness or worsening of abdominal pains  Swelling of the abdomen that is new, acute  Fever of 100F or higher For urgent or emergent issues, a gastroenterologist can be reached at any hour by calling [phone].   DIET:  We do recommend a small meal at first,  but then you may proceed to your regular diet.  Drink plenty of fluids but you should avoid alcoholic beverages for 24 hours.  ACTIVITY:  You should plan to take it easy for the rest of today and you should NOT DRIVE or use heavy machinery until tomorrow (because of the sedation medicines used during the test).    FOLLOW UP: Our staff will call the number listed on your records the next business day following your procedure to check on you and address any questions or concerns that you may have regarding the information given to you following your procedure. If we do not reach you, we will leave a message.  However, if you are feeling well and you are not experiencing any problems, there is no need to return our call.  We will assume that you have returned to your regular daily activities without incident.  If any biopsies were taken you will be contacted by phone or by letter within the next 1-3 weeks.  Please call us at [phone] if you have not heard about the biopsies in 3 weeks.    SIGNATURES/CONFIDENTIALITY: You and/or your care partner have signed paperwork which will be entered into your electronic medical record.  These signatures attest to the fact that that the information above on your After Visit Summary has been reviewed and is understood.  Full responsibility of the confidentiality of this discharge information lies with you and/or your care-partner.

## 2017-04-19 NOTE — Op Note (Signed)
[facility] Patient Name: Joy Thompson Procedure Date: 04/19/2017 1:51 PM MRN: [id number] Endoscopist: Gatha Mayer , MD Age: 77 Referring MD:  Date of Birth: September 11, 1940 Gender: Female Account #: [id number] Procedure:                Colonoscopy Indications:              Positive Cologuard test Medicines:                Propofol per Anesthesia, Monitored Anesthesia Care Procedure:                Pre-Anesthesia Assessment:                           - Prior to the procedure, a History and Physical                            was performed, and patient medications and                            allergies were reviewed. The patient's tolerance of                            previous anesthesia was also reviewed. The risks                            and benefits of the procedure and the sedation                            options and risks were discussed with the patient.                            All questions were answered, and informed consent                            was obtained. Prior Anticoagulants: The patient has                            taken no previous anticoagulant or antiplatelet                            agents. ASA Grade Assessment: II - A patient with                            mild systemic disease. After reviewing the risks                            and benefits, the patient was deemed in                            satisfactory condition to undergo the procedure.                           After obtaining informed consent, the colonoscope  was passed under direct vision. Throughout the                            procedure, the patient's blood pressure, pulse, and                            oxygen saturations were monitored continuously. The                            Colonoscope was introduced through the anus and                            advanced to the the cecum, identified by                            appendiceal orifice  and ileocecal valve. The                            colonoscopy was somewhat difficult due to a                            tortuous colon. Successful completion of the                            procedure was aided by withdrawing and reinserting                            the scope. The patient tolerated the procedure                            well. The quality of the bowel preparation was                            excellent. The bowel preparation used was Miralax.                            The ileocecal valve, appendiceal orifice, and                            rectum were photographed. Scope In: 1:57:48 PM Scope Out: 2:15:22 PM Scope Withdrawal Time: 0 hours 12 minutes 57 seconds  Total Procedure Duration: 0 hours 17 minutes 34 seconds  Findings:                 The perianal and digital rectal examinations were                            normal.                           External and internal hemorrhoids were found during                            retroflexion.  Many diverticula were found in the sigmoid colon.                            There was narrowing of the colon in association                            with the diverticular opening.                           The exam was otherwise without abnormality on                            direct and retroflexion views. Complications:            No immediate complications. Estimated Blood Loss:     Estimated blood loss: none. Impression:               - External and internal hemorrhoids. probably cause                            of + Cologuard                           - Severe diverticulosis in the sigmoid colon. There                            was narrowing of the colon in association with the                            diverticular opening.                           - The examination was otherwise normal on direct                            and retroflexion views.                           - No  specimens collected. Recommendation:           - Patient has a contact number available for                            emergencies. The signs and symptoms of potential                            delayed complications were discussed with the                            patient. Return to normal activities tomorrow.                            Written discharge instructions were provided to the                            patient.                           -  Resume previous diet.                           - Continue present medications.                           - No repeat colonoscopy due to age and the absence                            of colonic polyps. Gatha Mayer, MD 04/19/2017 2:22:50 PM This report has been signed electronically.

## 2017-04-20 ENCOUNTER — Telehealth: Payer: Self-pay | Admitting: *Deleted

## 2017-04-20 NOTE — Telephone Encounter (Signed)
  Follow up Call-  Call back number 04/19/2017  Post procedure Call Back phone  # [phone]  Permission to leave phone message Yes  Some recent data might be hidden     Patient questions:  Do you have a fever, pain , or abdominal swelling? No. Pain Score  0 *  Have you tolerated food without any problems? Yes.    Have you been able to return to your normal activities? Yes.    Do you have any questions about your discharge instructions: Diet   No. Medications  No. Follow up visit  No.  Do you have questions or concerns about your Care? No.  Actions: * If pain score is 4 or above: No action needed, pain <4.

## 2017-08-01 ENCOUNTER — Ambulatory Visit (INDEPENDENT_AMBULATORY_CARE_PROVIDER_SITE_OTHER): Payer: Medicare Other | Admitting: Gynecology

## 2017-08-01 ENCOUNTER — Encounter: Payer: Self-pay | Admitting: Gynecology

## 2017-08-01 ENCOUNTER — Telehealth: Payer: Self-pay | Admitting: *Deleted

## 2017-08-01 VITALS — BP 124/80 | Ht 62.0 in | Wt 103.0 lb

## 2017-08-01 DIAGNOSIS — M810 Age-related osteoporosis without current pathological fracture: Secondary | ICD-10-CM

## 2017-08-01 DIAGNOSIS — Z01419 Encounter for gynecological examination (general) (routine) without abnormal findings: Secondary | ICD-10-CM

## 2017-08-01 DIAGNOSIS — N952 Postmenopausal atrophic vaginitis: Secondary | ICD-10-CM | POA: Diagnosis not present

## 2017-08-01 NOTE — Patient Instructions (Signed)
Follow-up in June when due for Prolia shot.  Follow-up in July when due for your mammography.  Follow-up in 1 year for annual exam

## 2017-08-01 NOTE — Telephone Encounter (Signed)
Prolia insurance verification has been sent awaiting Summary of benefits  

## 2017-08-01 NOTE — Progress Notes (Signed)
    Joy Thompson Sep 03, 1940 [id number]        76 y.o.  [id number] for annual gynecologic exam.  Without gynecologic complaints.  Past medical history,surgical history, problem list, medications, allergies, family history and social history were all reviewed and documented as reviewed in the EPIC chart.  ROS:  Performed with pertinent positives and negatives included in the history, assessment and plan.   Additional significant findings : None   Exam: Joy Thompson assistant Vitals:   08/01/17 1002  BP: 124/80  Weight: 103 lb (46.7 kg)  Height: 5\' 2"  (1.575 m)   Body mass index is 18.84 kg/m.  General appearance:  Normal affect, orientation and appearance. Skin: Grossly normal HEENT: Without gross lesions.  No cervical or supraclavicular adenopathy. Thyroid normal.  Lungs:  Clear without wheezing, rales or rhonchi Cardiac: RR, without RMG Abdominal:  Soft, nontender, without masses, guarding, rebound, organomegaly or hernia Breasts:  Examined lying and sitting without masses, retractions, discharge or axillary adenopathy. Pelvic:  Ext, BUS, Vagina: With atrophic changes  Cervix: With atrophic changes  Uterus: Anteverted, normal size, shape and contour, midline and mobile nontender   Adnexa: Without masses or tenderness    Anus and perineum: Normal   Rectovaginal: Normal sphincter tone without palpated masses or tenderness.    Assessment/Plan:  77 y.o. [id number] female for annual gynecologic exam.   1. Postmenopausal/atrophic genital changes.  Without significant hot flushes, night sweats, vaginal dryness or any bleeding. 2. History of small nodule periphery of right breast by patient exam.  Evaluation with ultrasound showed small benign-appearing lymph node.  Has remained stable to patient's self breast exams.  Exam today is normal without clearly palpable mass or other abnormalities.  Has mammography coming due in July and will follow-up for this.  Will continue with self breast  exams and report any changes on her self breast exam.  Osteoporosis.  DEXA 08/2016 T score -2.5.  Improved from prior DEXA.  Continues on Prolia now for approximately 2 years.  Had been on bisphosphate's previously.  We will continue Prolia and plan on repeat DEXA next year at 2-year interval.  She will follow-up in June when due for her next injection. 3. Pap smear 2015.  No Pap smear done today.  Patient reports no abnormal Pap smear history.  Discussed current screening guidelines and we both agree to stop screening based on age. 4. Colonoscopy 2019.  Repeat at their recommended interval. 5. Health maintenance.  No routine lab work done as patient does this elsewhere.  Follow-up for Prolia in June.  Follow-up for mammography in July.  Follow-up for annual exam in 1 year.   Joy Auerbach MD, 10:33 AM 08/01/2017

## 2017-08-23 ENCOUNTER — Ambulatory Visit: Payer: Medicare Other | Admitting: Internal Medicine

## 2017-08-31 ENCOUNTER — Encounter: Payer: Self-pay | Admitting: Internal Medicine

## 2017-08-31 ENCOUNTER — Ambulatory Visit: Payer: Medicare Other | Admitting: Internal Medicine

## 2017-08-31 ENCOUNTER — Other Ambulatory Visit (INDEPENDENT_AMBULATORY_CARE_PROVIDER_SITE_OTHER): Payer: Medicare Other

## 2017-08-31 VITALS — BP 140/88 | HR 91 | Temp 99.1°F | Ht 62.0 in | Wt 104.0 lb

## 2017-08-31 DIAGNOSIS — M810 Age-related osteoporosis without current pathological fracture: Secondary | ICD-10-CM

## 2017-08-31 DIAGNOSIS — M545 Low back pain: Secondary | ICD-10-CM

## 2017-08-31 DIAGNOSIS — L570 Actinic keratosis: Secondary | ICD-10-CM | POA: Diagnosis not present

## 2017-08-31 LAB — BASIC METABOLIC PANEL
BUN: 14 mg/dL (ref 6–23)
CALCIUM: 10 mg/dL (ref 8.4–10.5)
CO2: 31 mEq/L (ref 19–32)
Chloride: 101 mEq/L (ref 96–112)
Creatinine, Ser: 0.75 mg/dL (ref 0.40–1.20)
GFR: 79.63 mL/min (ref >60.00)
GLUCOSE: 97 mg/dL (ref 70–99)
Potassium: 3.9 mEq/L (ref 3.5–5.1)
SODIUM: 141 meq/L (ref 135–145)

## 2017-08-31 NOTE — Assessment & Plan Note (Signed)
Doing well 

## 2017-08-31 NOTE — Patient Instructions (Signed)
Brix jar opener 

## 2017-08-31 NOTE — Assessment & Plan Note (Signed)
cryo 

## 2017-08-31 NOTE — Assessment & Plan Note (Signed)
Prolia

## 2017-08-31 NOTE — Progress Notes (Signed)
Subjective:  Patient ID: Joy Thompson, female    DOB: [birth year]  Age: 77 y.o. MRN: [id number]  CC: No chief complaint on file.   HPI Joy Thompson presents for skin lesions - new, osteoporosis f/u  Outpatient Medications Prior to Visit  Medication Sig Dispense Refill  . aspirin 81 MG EC tablet Take 81 mg by mouth daily.      . calcium-vitamin D (OSCAL WITH D 500-200) 500-200 MG-UNIT per tablet Take 1.5 tablets by mouth.     . Cholecalciferol 1000 UNITS tablet Take 1,000 Units by mouth daily.      Marland Kitchen CRANBERRY PO Take 1 tablet by mouth daily.    Marland Kitchen denosumab (PROLIA) 60 MG/ML SOLN injection Inject 60 mg into the skin every 6 (six) months. Administer in upper arm, thigh, or abdomen    . Multiple Vitamins-Minerals (MULTIVITAMIN,TX-MINERALS) tablet Take 1 tablet by mouth daily.       Facility-Administered Medications Prior to Visit  Medication Dose Route Frequency Provider Last Rate Last Dose  . 0.9 %  sodium chloride infusion  500 mL Intravenous Once Gatha Mayer, MD        ROS: Review of Systems  Constitutional: Negative for activity change, appetite change, chills, fatigue and unexpected weight change.  HENT: Negative for congestion, mouth sores and sinus pressure.   Eyes: Negative for visual disturbance.  Respiratory: Negative for cough and chest tightness.   Gastrointestinal: Negative for abdominal pain and nausea.  Genitourinary: Negative for difficulty urinating, frequency and vaginal pain.  Musculoskeletal: Negative for back pain and gait problem.  Skin: Negative for pallor and rash.  Neurological: Negative for dizziness, tremors, weakness, numbness and headaches.  Psychiatric/Behavioral: Negative for confusion, sleep disturbance and suicidal ideas.    Objective:  BP 140/88 (BP Location: Left Arm, Patient Position: Sitting, Cuff Size: Normal)   Pulse 91   Temp 99.1 F (37.3 C) (Oral)   Ht 5\' 2"  (1.575 m)   Wt 104 lb (47.2 kg)   SpO2 95%   BMI 19.02 kg/m    BP Readings from Last 3 Encounters:  08/31/17 140/88  08/01/17 124/80  04/19/17 (!) 152/67    Wt Readings from Last 3 Encounters:  08/31/17 104 lb (47.2 kg)  08/01/17 103 lb (46.7 kg)  04/19/17 103 lb (46.7 kg)    Physical Exam  Constitutional: She appears well-developed. No distress.  HENT:  Head: Normocephalic.  Right Ear: External ear normal.  Left Ear: External ear normal.  Nose: Nose normal.  Mouth/Throat: Oropharynx is clear and moist.  Eyes: Pupils are equal, round, and reactive to light. Conjunctivae are normal. Right eye exhibits no discharge. Left eye exhibits no discharge.  Neck: Normal range of motion. Neck supple. No JVD present. No tracheal deviation present. No thyromegaly present.  Cardiovascular: Normal rate, regular rhythm and normal heart sounds.  Pulmonary/Chest: No stridor. No respiratory distress. She has no wheezes.  Abdominal: Soft. Bowel sounds are normal. She exhibits no distension and no mass. There is no tenderness. There is no rebound and no guarding.  Musculoskeletal: She exhibits no edema or tenderness.  Lymphadenopathy:    She has no cervical adenopathy.  Neurological: She displays normal reflexes. No cranial nerve deficit. She exhibits normal muscle tone. Coordination normal.  Skin: No rash noted. No erythema.  Psychiatric: She has a normal mood and affect. Her behavior is normal. Judgment and thought content normal.  Thin   AKs    Procedure Note :     Procedure :  Cryosurgery   Indication:  Actinic keratosis(es)   Risks including unsuccessful procedure , bleeding, infection, bruising, scar, a need for a repeat  procedure and others were explained to the patient in detail as well as the benefits. Informed consent was obtained verbally.    5 lesion(s)  on trunk, leg, nose  was/were treated with liquid nitrogen on a Q-tip in a usual fasion . Band-Aid was applied and antibiotic ointment was given for a later use.   Tolerated well.  Complications none.   Postprocedure instructions :     Keep the wounds clean. You can wash them with liquid soap and water. Pat dry with gauze or a Kleenex tissue  Before applying antibiotic ointment and a Band-Aid.   You need to report immediately  if  any signs of infection develop.    Lab Results  Component Value Date   WBC 7.5 02/22/2017   HGB 13.5 02/22/2017   HCT 40.8 02/22/2017   PLT 256.0 02/22/2017   GLUCOSE 109 (H) 02/22/2017   CHOL 186 02/22/2017   TRIG 67.0 02/22/2017   HDL 79.40 02/22/2017   LDLCALC 93 02/22/2017   ALT 21 02/22/2017   AST 22 02/22/2017   NA 140 02/22/2017   K 3.8 02/22/2017   CL 101 02/22/2017   CREATININE 0.73 02/22/2017   BUN 11 02/22/2017   CO2 31 02/22/2017   TSH 3.99 02/22/2017    Dg Bone Density  Result Date: 09/11/2016 Date of study: 09/06/16 Exam: DUAL X-RAY ABSORPTIOMETRY (DXA) FOR BONE MINERAL DENSITY (BMD) Instrument: [employer] Provider: PCP Indication: screening for osteoporosis Comparison: none (please note that it is not possible to compare data from different instruments) Clinical data: Pt is a 77 y.o. female with history of fracture. Results:  Lumbar spine (L1-L4) Femoral neck (FN) 33% distal radius T-score -1.8 RFN: - 2.5 LFN: - 2.3 n/a Change in BMD from previous DXA test (%) Up 10.6% Up 2.2% n/a (*) statistically significant Assessment: Patient has OSTEOPOROSIS according to the [facility] classification for osteoporosis (see below). Fracture risk: high Comments: the technical quality of the study is good Evaluation for secondary causes should be considered if clinically indicated. Recommend optimizing calcium (1200 mg/day) and vitamin D (800 IU/day). Followup: Repeat BMD is appropriate after 2 years or after 1-2 years if starting treatment. WHO criteria for diagnosis of osteoporosis in postmenopausal women and in men 77 y/o or older: - normal: T-score -1.0 to + 1.0 - osteopenia/low bone density: T-score between -2.5 and  -1.0 - osteoporosis: T-score below -2.5 - severe osteoporosis: T-score below -2.5 with history of fragility fracture Note: although not part of the WHO classification, the presence of a fragility fracture, regardless of the T-score, should be considered diagnostic of osteoporosis, provided other causes for the fracture have been excluded. Treatment: The National Osteoporosis Foundation recommends that treatment be considered in postmenopausal women and men age 16 or older with: 1. Hip or vertebral (clinical or morphometric) fracture 2. T-score of - 2.5 or lower at the spine or hip 3. 10-year fracture probability by FRAX of at least 20% for a major osteoporotic fracture and 3% for a hip fracture Loura Pardon MD    Assessment & Plan:   There are no diagnoses linked to this encounter.   No orders of the defined types were placed in this encounter.    Follow-up: No follow-ups on file.  Walker Kehr, MD

## 2017-09-02 NOTE — Telephone Encounter (Addendum)
Deductible Individual: $290 ($290 met)   OOP MAX Individual: $3290 ($423 met)  Annual exam 08/01/17  Calcium 10.0      Date 08/31/17  Upcoming dental procedures NO  Prior Authorization needed NO  Pt estimated Cost $222  APPT 09/07/17 at 9:00      Coverage Details: 20% one dose,20% admin fee

## 2017-09-07 ENCOUNTER — Ambulatory Visit (INDEPENDENT_AMBULATORY_CARE_PROVIDER_SITE_OTHER): Payer: Medicare Other | Admitting: Gynecology

## 2017-09-07 DIAGNOSIS — M810 Age-related osteoporosis without current pathological fracture: Secondary | ICD-10-CM | POA: Diagnosis not present

## 2017-09-07 MED ORDER — DENOSUMAB 60 MG/ML ~~LOC~~ SOSY
60.0000 mg | PREFILLED_SYRINGE | Freq: Once | SUBCUTANEOUS | Status: AC
  Administered 2017-09-07: 60 mg via SUBCUTANEOUS

## 2017-09-08 NOTE — Telephone Encounter (Signed)
Prolia Given 09/07/17 Next injection 03/10/18

## 2017-09-27 LAB — HM MAMMOGRAPHY

## 2017-09-29 ENCOUNTER — Encounter: Payer: Self-pay | Admitting: Internal Medicine

## 2018-01-16 ENCOUNTER — Telehealth: Payer: Self-pay | Admitting: *Deleted

## 2018-01-16 NOTE — Telephone Encounter (Addendum)
Deductible $290 ($235met)  OOP MAX $3290 (1026met)  Annual exam 5/13/.19 TF  Calcium 10.0             Date 08/31/17  Upcoming dental procedures NO  Prior Authorization needed  no  Pt estimated Cost $222  appt 03/10/18 at 10:00     Coverage Details: 20% OF ONE DOSE 20% ADMIN FEE

## 2018-02-23 ENCOUNTER — Ambulatory Visit (INDEPENDENT_AMBULATORY_CARE_PROVIDER_SITE_OTHER): Payer: Medicare Other | Admitting: Internal Medicine

## 2018-02-23 ENCOUNTER — Encounter: Payer: Self-pay | Admitting: Internal Medicine

## 2018-02-23 ENCOUNTER — Other Ambulatory Visit (INDEPENDENT_AMBULATORY_CARE_PROVIDER_SITE_OTHER): Payer: Medicare Other

## 2018-02-23 VITALS — BP 160/90 | HR 89 | Temp 98.5°F | Ht 62.0 in | Wt 104.0 lb

## 2018-02-23 DIAGNOSIS — R609 Edema, unspecified: Secondary | ICD-10-CM

## 2018-02-23 DIAGNOSIS — E785 Hyperlipidemia, unspecified: Secondary | ICD-10-CM

## 2018-02-23 DIAGNOSIS — Z23 Encounter for immunization: Secondary | ICD-10-CM

## 2018-02-23 DIAGNOSIS — Z0000 Encounter for general adult medical examination without abnormal findings: Secondary | ICD-10-CM

## 2018-02-23 DIAGNOSIS — R030 Elevated blood-pressure reading, without diagnosis of hypertension: Secondary | ICD-10-CM

## 2018-02-23 LAB — TSH: TSH: 4.96 u[IU]/mL — ABNORMAL HIGH (ref 0.35–4.50)

## 2018-02-23 LAB — CBC WITH DIFFERENTIAL/PLATELET
Basophils Absolute: 0 10*3/uL (ref 0.0–0.1)
Basophils Relative: 0.6 % (ref 0.0–3.0)
Eosinophils Absolute: 0.1 10*3/uL (ref 0.0–0.7)
Eosinophils Relative: 1.3 % (ref 0.0–5.0)
HCT: 43.7 % (ref 36.0–46.0)
HEMOGLOBIN: 14.6 g/dL (ref 12.0–15.0)
Lymphocytes Relative: 19.5 % (ref 12.0–46.0)
Lymphs Abs: 1.3 10*3/uL (ref 0.7–4.0)
MCHC: 33.5 g/dL (ref 30.0–36.0)
MCV: 89.2 fl (ref 78.0–100.0)
MONOS PCT: 8.6 % (ref 3.0–12.0)
Monocytes Absolute: 0.6 10*3/uL (ref 0.1–1.0)
Neutro Abs: 4.5 10*3/uL (ref 1.4–7.7)
Neutrophils Relative %: 70 % (ref 43.0–77.0)
Platelets: 297 10*3/uL (ref 150.0–400.0)
RBC: 4.89 Mil/uL (ref 3.87–5.11)
RDW: 12.8 % (ref 11.5–15.5)
WBC: 6.5 10*3/uL (ref 4.0–10.5)

## 2018-02-23 LAB — LIPID PANEL
Cholesterol: 193 mg/dL (ref 0–200)
HDL: 82.9 mg/dL (ref >39.00)
LDL Cholesterol: 91 mg/dL (ref 0–99)
NonHDL: 109.67
Total CHOL/HDL Ratio: 2
Triglycerides: 92 mg/dL (ref 0.0–149.0)
VLDL: 18.4 mg/dL (ref 0.0–40.0)

## 2018-02-23 LAB — URINALYSIS, ROUTINE W REFLEX MICROSCOPIC
Bilirubin Urine: NEGATIVE
Hgb urine dipstick: NEGATIVE
Ketones, ur: NEGATIVE
NITRITE: POSITIVE — AB
RBC / HPF: NONE SEEN (ref 0–?)
Specific Gravity, Urine: <=1.005 — AB (ref 1.000–1.030)
Total Protein, Urine: NEGATIVE
URINE GLUCOSE: NEGATIVE
Urobilinogen, UA: 0.2 (ref 0.0–1.0)
pH: 7 (ref 5.0–8.0)

## 2018-02-23 LAB — BASIC METABOLIC PANEL
BUN: 9 mg/dL (ref 6–23)
CALCIUM: 10.1 mg/dL (ref 8.4–10.5)
CO2: 30 mEq/L (ref 19–32)
Chloride: 99 mEq/L (ref 96–112)
Creatinine, Ser: 0.78 mg/dL (ref 0.40–1.20)
GFR: 76.01 mL/min (ref >60.00)
Glucose, Bld: 111 mg/dL — ABNORMAL HIGH (ref 70–99)
Potassium: 4.2 mEq/L (ref 3.5–5.1)
Sodium: 140 mEq/L (ref 135–145)

## 2018-02-23 LAB — HEPATIC FUNCTION PANEL
ALT: 21 U/L (ref 0–35)
AST: 24 U/L (ref 0–37)
Albumin: 4.8 g/dL (ref 3.5–5.2)
Alkaline Phosphatase: 56 U/L (ref 39–117)
BILIRUBIN DIRECT: 0.1 mg/dL (ref 0.0–0.3)
Total Bilirubin: 0.6 mg/dL (ref 0.2–1.2)
Total Protein: 7.5 g/dL (ref 6.0–8.3)

## 2018-02-23 MED ORDER — HYDROCHLOROTHIAZIDE 12.5 MG PO CAPS: 12.5000 mg | ORAL_CAPSULE | Freq: Every day | ORAL | 11 refills | Status: DC

## 2018-02-23 MED ORDER — ZOSTER VAC RECOMB ADJUVANTED 50 MCG/0.5ML IM SUSR: 0.5000 mL | Freq: Once | INTRAMUSCULAR | 1 refills | Status: AC

## 2018-02-23 NOTE — Assessment & Plan Note (Signed)
Here for medicare wellness/physical  Diet: heart healthy  Physical activity: not sedentary  Depression/mood screen: negative  Hearing: intact to whispered voice  Visual acuity: grossly normal, performs annual eye exam  ADLs: capable  Fall risk: low to none  Home safety: good  Cognitive evaluation: intact to orientation, naming, recall and repetition  EOL planning: adv directives, full code/ I agree  I have personally reviewed and have noted  1. The patient's medical, surgical and social history  2. Their use of alcohol, tobacco or illicit drugs  3. Their current medications and supplements  4. The patient's functional ability including ADL's, fall risks, home safety risks and hearing or visual impairment.  5. Diet and physical activities  6. Evidence for depression or mood disorders 7. The roster of all physicians providing medical care to patient - is listed in the Snapshot section of the chart and reviewed today.    Today patient counseled on age appropriate routine health concerns for screening and prevention, each reviewed and up to date or declined. Immunizations reviewed and up to date or declined. Labs ordered and reviewed. Risk factors for depression reviewed and negative. Hearing function and visual acuity are intact. ADLs screened and addressed as needed. Functional ability and level of safety reviewed and appropriate. Education, counseling and referrals performed based on assessed risks today. Patient provided with a copy of personalized plan for preventive services.   CT ca scoring test offered 12/19

## 2018-02-23 NOTE — Addendum Note (Signed)
Addended by: Karren Cobble on: 02/23/2018 09:08 AM   Modules accepted: Orders

## 2018-02-23 NOTE — Progress Notes (Signed)
Subjective:  Patient ID: Joy Thompson, female    DOB: 11/02/1940  Age: 77 y.o. MRN: [id number]  CC: No chief complaint on file.   HPI Joy Thompson presents for a well exam C/o elev BP - [id number] at home  Outpatient Medications Prior to Visit  Medication Sig Dispense Refill  . aspirin 81 MG EC tablet Take 81 mg by mouth daily.      . calcium-vitamin D (OSCAL WITH D 500-200) 500-200 MG-UNIT per tablet Take 1.5 tablets by mouth.     . Cholecalciferol 1000 UNITS tablet Take 1,000 Units by mouth daily.      Marland Kitchen CRANBERRY PO Take 1 tablet by mouth daily.    Marland Kitchen denosumab (PROLIA) 60 MG/ML SOLN injection Inject 60 mg into the skin every 6 (six) months. Administer in upper arm, thigh, or abdomen    . Multiple Vitamins-Minerals (MULTIVITAMIN,TX-MINERALS) tablet Take 1 tablet by mouth daily.       Facility-Administered Medications Prior to Visit  Medication Dose Route Frequency Provider Last Rate Last Dose  . 0.9 %  sodium chloride infusion  500 mL Intravenous Once Gatha Mayer, MD        ROS: Review of Systems  Constitutional: Negative for activity change, appetite change, chills, fatigue and unexpected weight change.  HENT: Negative for congestion, mouth sores and sinus pressure.   Eyes: Negative for visual disturbance.  Respiratory: Negative for cough and chest tightness.   Gastrointestinal: Negative for abdominal pain and nausea.  Genitourinary: Negative for difficulty urinating, frequency and vaginal pain.  Musculoskeletal: Negative for back pain and gait problem.  Skin: Negative for pallor and rash.  Neurological: Negative for dizziness, tremors, weakness, numbness and headaches.  Psychiatric/Behavioral: Negative for confusion, sleep disturbance and suicidal ideas.    Objective:  BP (!) 160/90 (BP Location: Left Arm, Patient Position: Sitting, Cuff Size: Normal)   Pulse 89   Temp 98.5 F (36.9 C) (Oral)   Ht 5\' 2"  (1.575 m)   Wt 104 lb (47.2 kg)   SpO2 95%   BMI  19.02 kg/m   BP Readings from Last 3 Encounters:  02/23/18 (!) 160/90  08/31/17 140/88  08/01/17 124/80    Wt Readings from Last 3 Encounters:  02/23/18 104 lb (47.2 kg)  08/31/17 104 lb (47.2 kg)  08/01/17 103 lb (46.7 kg)    Physical Exam  Constitutional: She appears well-developed. No distress.  HENT:  Head: Normocephalic.  Right Ear: External ear normal.  Left Ear: External ear normal.  Nose: Nose normal.  Mouth/Throat: Oropharynx is clear and moist.  Eyes: Pupils are equal, round, and reactive to light. Conjunctivae are normal. Right eye exhibits no discharge. Left eye exhibits no discharge.  Neck: Normal range of motion. Neck supple. No JVD present. No tracheal deviation present. No thyromegaly present.  Cardiovascular: Normal rate, regular rhythm and normal heart sounds.  Pulmonary/Chest: No stridor. No respiratory distress. She has no wheezes.  Abdominal: Soft. Bowel sounds are normal. She exhibits no distension and no mass. There is no tenderness. There is no rebound and no guarding.  Musculoskeletal: She exhibits no edema or tenderness.  Lymphadenopathy:    She has no cervical adenopathy.  Neurological: She displays normal reflexes. No cranial nerve deficit. She exhibits normal muscle tone. Coordination normal.  Skin: No rash noted. No erythema.  Psychiatric: She has a normal mood and affect. Her behavior is normal. Judgment and thought content normal.  RLE w/trace edema  Lab Results  Component Value Date  WBC 7.5 02/22/2017   HGB 13.5 02/22/2017   HCT 40.8 02/22/2017   PLT 256.0 02/22/2017   GLUCOSE 97 08/31/2017   CHOL 186 02/22/2017   TRIG 67.0 02/22/2017   HDL 79.40 02/22/2017   LDLCALC 93 02/22/2017   ALT 21 02/22/2017   AST 22 02/22/2017   NA 141 08/31/2017   K 3.9 08/31/2017   CL 101 08/31/2017   CREATININE 0.75 08/31/2017   BUN 14 08/31/2017   CO2 31 08/31/2017   TSH 3.99 02/22/2017    Dg Bone Density  Result Date: 09/11/2016 Date of  study: 09/06/16 Exam: DUAL X-RAY ABSORPTIOMETRY (DXA) FOR BONE MINERAL DENSITY (BMD) Instrument: [employer] Provider: PCP Indication: screening for osteoporosis Comparison: none (please note that it is not possible to compare data from different instruments) Clinical data: Pt is a 77 y.o. female with history of fracture. Results:  Lumbar spine (L1-L4) Femoral neck (FN) 33% distal radius T-score -1.8 RFN: - 2.5 LFN: - 2.3 n/a Change in BMD from previous DXA test (%) Up 10.6% Up 2.2% n/a (*) statistically significant Assessment: Patient has OSTEOPOROSIS according to the [facility] classification for osteoporosis (see below). Fracture risk: high Comments: the technical quality of the study is good Evaluation for secondary causes should be considered if clinically indicated. Recommend optimizing calcium (1200 mg/day) and vitamin D (800 IU/day). Followup: Repeat BMD is appropriate after 2 years or after 1-2 years if starting treatment. WHO criteria for diagnosis of osteoporosis in postmenopausal women and in men 64 y/o or older: - normal: T-score -1.0 to + 1.0 - osteopenia/low bone density: T-score between -2.5 and -1.0 - osteoporosis: T-score below -2.5 - severe osteoporosis: T-score below -2.5 with history of fragility fracture Note: although not part of the WHO classification, the presence of a fragility fracture, regardless of the T-score, should be considered diagnostic of osteoporosis, provided other causes for the fracture have been excluded. Treatment: The National Osteoporosis Foundation recommends that treatment be considered in postmenopausal women and men age 33 or older with: 1. Hip or vertebral (clinical or morphometric) fracture 2. T-score of - 2.5 or lower at the spine or hip 3. 10-year fracture probability by FRAX of at least 20% for a major osteoporotic fracture and 3% for a hip fracture Loura Pardon MD    Assessment & Plan:   There are no diagnoses linked to this encounter.   No  orders of the defined types were placed in this encounter.    Follow-up: No follow-ups on file.  Walker Kehr, MD

## 2018-02-23 NOTE — Assessment & Plan Note (Signed)
Microzide qd

## 2018-02-23 NOTE — Patient Instructions (Signed)

## 2018-02-27 ENCOUNTER — Other Ambulatory Visit: Payer: Self-pay

## 2018-02-27 DIAGNOSIS — R7989 Other specified abnormal findings of blood chemistry: Secondary | ICD-10-CM

## 2018-03-10 ENCOUNTER — Ambulatory Visit (INDEPENDENT_AMBULATORY_CARE_PROVIDER_SITE_OTHER): Payer: Medicare Other | Admitting: Gynecology

## 2018-03-10 DIAGNOSIS — M810 Age-related osteoporosis without current pathological fracture: Secondary | ICD-10-CM

## 2018-03-10 MED ORDER — DENOSUMAB 60 MG/ML ~~LOC~~ SOSY
60.0000 mg | PREFILLED_SYRINGE | Freq: Once | SUBCUTANEOUS | Status: AC
  Administered 2018-03-10: 60 mg via SUBCUTANEOUS

## 2018-03-24 NOTE — Telephone Encounter (Signed)
PROLIA GIVEN 03/10/18 NEXT INJECTION 09/10/2018

## 2018-04-04 ENCOUNTER — Encounter: Payer: Self-pay | Admitting: Gynecology

## 2018-05-31 ENCOUNTER — Other Ambulatory Visit: Payer: Self-pay

## 2018-05-31 ENCOUNTER — Ambulatory Visit: Payer: Medicare Other | Admitting: Internal Medicine

## 2018-05-31 ENCOUNTER — Telehealth: Payer: Self-pay

## 2018-05-31 ENCOUNTER — Encounter: Payer: Self-pay | Admitting: Internal Medicine

## 2018-05-31 VITALS — BP 162/94 | HR 111 | Temp 98.3°F | Ht 62.0 in | Wt 104.0 lb

## 2018-05-31 DIAGNOSIS — M25471 Effusion, right ankle: Secondary | ICD-10-CM

## 2018-05-31 DIAGNOSIS — M810 Age-related osteoporosis without current pathological fracture: Secondary | ICD-10-CM

## 2018-05-31 DIAGNOSIS — R030 Elevated blood-pressure reading, without diagnosis of hypertension: Secondary | ICD-10-CM

## 2018-05-31 DIAGNOSIS — E785 Hyperlipidemia, unspecified: Secondary | ICD-10-CM

## 2018-05-31 NOTE — Assessment & Plan Note (Signed)
R - post-fx

## 2018-05-31 NOTE — Assessment & Plan Note (Signed)
Microxide

## 2018-05-31 NOTE — Telephone Encounter (Signed)
Copied from [place] [id number]. Topic: General - Other >> May 31, 2018  9:31 AM Keene Breath wrote: Reason for CRM: Patient called to give Jonelle Sidle some information that she said Jonelle Sidle wanted.  Please call patient back.  [phone] >> May 31, 2018  9:36 AM Reyne Dumas L wrote: Pt called to let Irene Shipper know that her last Prolia shot was 03/10/2018. Pt can be reached at [phone].  Left message adivising patient that I did get her last injection date message and we will be verifiying insurance close to June 2020 for her next prolia injection, will call to advise about her summary of benefits at that time

## 2018-05-31 NOTE — Assessment & Plan Note (Signed)
Prolia Vit D 

## 2018-05-31 NOTE — Progress Notes (Signed)
Subjective:  Patient ID: Joy Thompson, female    DOB: [birth year]  Age: 78 y.o. MRN: [id number]  CC: No chief complaint on file.   HPI NALAYSIA MANGANIELLO presents for HTN, OA, osteoporosis f/u SBP - was 120-146, now 115-125 at home - not taking Microzide yet...  Outpatient Medications Prior to Visit  Medication Sig Dispense Refill   aspirin 81 MG EC tablet Take 81 mg by mouth daily.       calcium-vitamin D (OSCAL WITH D 500-200) 500-200 MG-UNIT per tablet Take 1.5 tablets by mouth.      Cholecalciferol 1000 UNITS tablet Take 1,000 Units by mouth daily.       CRANBERRY PO Take 1 tablet by mouth daily.     denosumab (PROLIA) 60 MG/ML SOLN injection Inject 60 mg into the skin every 6 (six) months. Administer in upper arm, thigh, or abdomen     hydrochlorothiazide (MICROZIDE) 12.5 MG capsule Take 1 capsule (12.5 mg total) by mouth daily. 30 capsule 11   Multiple Vitamins-Minerals (MULTIVITAMIN,TX-MINERALS) tablet Take 1 tablet by mouth daily.       Facility-Administered Medications Prior to Visit  Medication Dose Route Frequency Provider Last Rate Last Dose   0.9 %  sodium chloride infusion  500 mL Intravenous Once Gatha Mayer, MD        ROS: Review of Systems  Constitutional: Negative for activity change, appetite change, chills, fatigue and unexpected weight change.  HENT: Negative for congestion, mouth sores and sinus pressure.   Eyes: Negative for visual disturbance.  Respiratory: Negative for cough and chest tightness.   Gastrointestinal: Negative for abdominal pain and nausea.  Genitourinary: Negative for difficulty urinating, frequency and vaginal pain.  Musculoskeletal: Negative for back pain and gait problem.  Skin: Negative for pallor and rash.  Neurological: Negative for dizziness, tremors, weakness, numbness and headaches.  Psychiatric/Behavioral: Negative for confusion, sleep disturbance and suicidal ideas.    Objective:  BP (!) 162/94 (BP Location: Left  Arm, Patient Position: Sitting, Cuff Size: Normal)    Pulse (!) 111    Temp 98.3 F (36.8 C) (Oral)    Ht 5\' 2"  (1.575 m)    Wt 104 lb (47.2 kg)    SpO2 95%    BMI 19.02 kg/m   BP Readings from Last 3 Encounters:  05/31/18 (!) 162/94  02/23/18 (!) 160/90  08/31/17 140/88    Wt Readings from Last 3 Encounters:  05/31/18 104 lb (47.2 kg)  02/23/18 104 lb (47.2 kg)  08/31/17 104 lb (47.2 kg)    Physical Exam Constitutional:      General: She is not in acute distress.    Appearance: She is well-developed.  HENT:     Head: Normocephalic.     Right Ear: External ear normal.     Left Ear: External ear normal.     Nose: Nose normal.  Eyes:     General:        Right eye: No discharge.        Left eye: No discharge.     Conjunctiva/sclera: Conjunctivae normal.     Pupils: Pupils are equal, round, and reactive to light.  Neck:     Musculoskeletal: Normal range of motion and neck supple.     Thyroid: No thyromegaly.     Vascular: No JVD.     Trachea: No tracheal deviation.  Cardiovascular:     Rate and Rhythm: Normal rate and regular rhythm.     Heart sounds: Normal  heart sounds.  Pulmonary:     Effort: No respiratory distress.     Breath sounds: No stridor. No wheezing.  Abdominal:     General: Bowel sounds are normal. There is no distension.     Palpations: Abdomen is soft. There is no mass.     Tenderness: There is no abdominal tenderness. There is no guarding or rebound.  Musculoskeletal:        General: No tenderness.  Lymphadenopathy:     Cervical: No cervical adenopathy.  Skin:    Findings: No erythema or rash.  Neurological:     Cranial Nerves: No cranial nerve deficit.     Motor: No abnormal muscle tone.     Coordination: Coordination normal.     Deep Tendon Reflexes: Reflexes normal.  Psychiatric:        Behavior: Behavior normal.        Thought Content: Thought content normal.        Judgment: Judgment normal.     Lab Results  Component Value Date    WBC 6.5 02/23/2018   HGB 14.6 02/23/2018   HCT 43.7 02/23/2018   PLT 297.0 02/23/2018   GLUCOSE 111 (H) 02/23/2018   CHOL 193 02/23/2018   TRIG 92.0 02/23/2018   HDL 82.90 02/23/2018   LDLCALC 91 02/23/2018   ALT 21 02/23/2018   AST 24 02/23/2018   NA 140 02/23/2018   K 4.2 02/23/2018   CL 99 02/23/2018   CREATININE 0.78 02/23/2018   BUN 9 02/23/2018   CO2 30 02/23/2018   TSH 4.96 (H) 02/23/2018    Dg Bone Density  Result Date: 09/11/2016 Date of study: 09/06/16 Exam: DUAL X-RAY ABSORPTIOMETRY (DXA) FOR BONE MINERAL DENSITY (BMD) Instrument: [employer] Provider: PCP Indication: screening for osteoporosis Comparison: none (please note that it is not possible to compare data from different instruments) Clinical data: Pt is a 78 y.o. female with history of fracture. Results:  Lumbar spine (L1-L4) Femoral neck (FN) 33% distal radius T-score -1.8 RFN: - 2.5 LFN: - 2.3 n/a Change in BMD from previous DXA test (%) Up 10.6% Up 2.2% n/a (*) statistically significant Assessment: Patient has OSTEOPOROSIS according to the [facility] classification for osteoporosis (see below). Fracture risk: high Comments: the technical quality of the study is good Evaluation for secondary causes should be considered if clinically indicated. Recommend optimizing calcium (1200 mg/day) and vitamin D (800 IU/day). Followup: Repeat BMD is appropriate after 2 years or after 1-2 years if starting treatment. WHO criteria for diagnosis of osteoporosis in postmenopausal women and in men 58 y/o or older: - normal: T-score -1.0 to + 1.0 - osteopenia/low bone density: T-score between -2.5 and -1.0 - osteoporosis: T-score below -2.5 - severe osteoporosis: T-score below -2.5 with history of fragility fracture Note: although not part of the WHO classification, the presence of a fragility fracture, regardless of the T-score, should be considered diagnostic of osteoporosis, provided other causes for the fracture have been  excluded. Treatment: The National Osteoporosis Foundation recommends that treatment be considered in postmenopausal women and men age 67 or older with: 1. Hip or vertebral (clinical or morphometric) fracture 2. T-score of - 2.5 or lower at the spine or hip 3. 10-year fracture probability by FRAX of at least 20% for a major osteoporotic fracture and 3% for a hip fracture Loura Pardon MD    Assessment & Plan:   There are no diagnoses linked to this encounter.   No orders of the defined types were  placed in this encounter.    Follow-up: No follow-ups on file.  Walker Kehr, MD

## 2018-08-03 ENCOUNTER — Encounter: Payer: Medicare Other | Admitting: Gynecology

## 2018-08-24 ENCOUNTER — Telehealth: Payer: Self-pay | Admitting: *Deleted

## 2018-08-24 NOTE — Telephone Encounter (Addendum)
Deductible $290 ($124met)  OOP MAX $3290 ($19met)  Annual exam Upcoming  10/27/2018  Calcium    10.1         Date 02/23/18  Upcoming dental procedures   Prior Authorization needed NO  Pt estimated Cost $180   Called pt she states she primary care provider will give her Prolia. I will take pt off list of Prolia call backs.    Coverage Details: 20% one dose,20% admin fee

## 2018-08-30 ENCOUNTER — Telehealth: Payer: Self-pay

## 2018-08-30 ENCOUNTER — Other Ambulatory Visit: Payer: Self-pay

## 2018-08-30 NOTE — Telephone Encounter (Signed)
Copied from [place] [phone]. Topic: Appointment Scheduling - Scheduling Inquiry for Clinic >> Aug 30, 2018  2:18 PM Reyne Dumas L wrote: Reason for CRM:   Pt wants to schedule Prolia injection >> Aug 30, 2018  2:35 PM Morey Hummingbird wrote: Message about prolia from gynecology in chart   Patient has been previously getting prolia injections at her GYN office, per dr Alain Marion, ok to start getting injections at [facility]---I will be verifying insurance coverage for prolia and call patient back to discuss summary of benefits----patients last prolia was dec./2019---

## 2018-08-30 NOTE — Telephone Encounter (Signed)
Copied from [place] [id number]. Topic: Appointment Scheduling - Scheduling Inquiry for Clinic >> Aug 30, 2018  2:18 PM Reyne Dumas L wrote: Reason for CRM:   Pt wants to schedule Prolia injection >> Aug 30, 2018  2:35 PM Morey Hummingbird wrote: Message about prolia from gynecology in chart   reverifying insurance for [facility] to take over prolia shots --pt coming from GYN office
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
## 2018-09-13 ENCOUNTER — Ambulatory Visit (INDEPENDENT_AMBULATORY_CARE_PROVIDER_SITE_OTHER): Payer: Medicare Other | Admitting: Ophthalmology

## 2018-09-13 ENCOUNTER — Other Ambulatory Visit: Payer: Self-pay

## 2018-09-13 ENCOUNTER — Encounter (INDEPENDENT_AMBULATORY_CARE_PROVIDER_SITE_OTHER): Payer: Self-pay | Admitting: Ophthalmology

## 2018-09-13 DIAGNOSIS — H33322 Round hole, left eye: Secondary | ICD-10-CM

## 2018-09-13 DIAGNOSIS — Z961 Presence of intraocular lens: Secondary | ICD-10-CM | POA: Diagnosis not present

## 2018-09-13 DIAGNOSIS — H3581 Retinal edema: Secondary | ICD-10-CM

## 2018-09-13 MED ORDER — PREDNISOLONE ACETATE 1 % OP SUSP: 1.0000 [drp] | Freq: Four times a day (QID) | OPHTHALMIC | 0 refills | Status: AC
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
## 2018-09-27 ENCOUNTER — Other Ambulatory Visit: Payer: Self-pay

## 2018-09-27 ENCOUNTER — Encounter (INDEPENDENT_AMBULATORY_CARE_PROVIDER_SITE_OTHER): Payer: Self-pay | Admitting: Ophthalmology

## 2018-09-27 ENCOUNTER — Ambulatory Visit (INDEPENDENT_AMBULATORY_CARE_PROVIDER_SITE_OTHER): Payer: Medicare Other | Admitting: Ophthalmology

## 2018-09-27 DIAGNOSIS — H33322 Round hole, left eye: Secondary | ICD-10-CM

## 2018-09-27 DIAGNOSIS — H3581 Retinal edema: Secondary | ICD-10-CM | POA: Diagnosis not present

## 2018-09-27 DIAGNOSIS — Z961 Presence of intraocular lens: Secondary | ICD-10-CM

## 2018-09-29 LAB — HM MAMMOGRAPHY

## 2018-10-06 ENCOUNTER — Encounter: Payer: Self-pay | Admitting: Internal Medicine

## 2018-10-06 ENCOUNTER — Other Ambulatory Visit: Payer: Self-pay | Admitting: Internal Medicine

## 2018-10-06 ENCOUNTER — Other Ambulatory Visit: Payer: Self-pay

## 2018-10-06 ENCOUNTER — Ambulatory Visit (INDEPENDENT_AMBULATORY_CARE_PROVIDER_SITE_OTHER)
Admission: RE | Admit: 2018-10-06 | Discharge: 2018-10-06 | Disposition: A | Discharge status: Home / Self Care | Payer: Self-pay | Source: Ambulatory Visit | Attending: Internal Medicine | Admitting: Internal Medicine

## 2018-10-06 DIAGNOSIS — E785 Hyperlipidemia, unspecified: Secondary | ICD-10-CM

## 2018-10-06 MED ORDER — ROSUVASTATIN CALCIUM 5 MG PO TABS: 2.5000 mg | ORAL_TABLET | Freq: Every day | ORAL | 3 refills | Status: DC

## 2018-10-09 ENCOUNTER — Encounter: Payer: Self-pay | Admitting: *Deleted

## 2018-10-09 ENCOUNTER — Ambulatory Visit (INDEPENDENT_AMBULATORY_CARE_PROVIDER_SITE_OTHER): Payer: Medicare Other

## 2018-10-09 DIAGNOSIS — M810 Age-related osteoporosis without current pathological fracture: Secondary | ICD-10-CM | POA: Diagnosis not present

## 2018-10-09 MED ORDER — DENOSUMAB 60 MG/ML ~~LOC~~ SOSY
60.0000 mg | PREFILLED_SYRINGE | Freq: Once | SUBCUTANEOUS | Status: AC
  Administered 2018-10-09: 15:00:00 60 mg via SUBCUTANEOUS

## 2018-10-13 ENCOUNTER — Telehealth: Payer: Self-pay | Admitting: *Deleted

## 2018-10-13 NOTE — Telephone Encounter (Signed)
Pt states she wants to continue getting her Prolia through DR. Plotnikov office. Pt asked if she can get her Prolia here in the future. I stated yes. But for now pt wants to continue with Dr. Alain Marion office. I will take pt off my list of call backs

## 2018-10-13 NOTE — Telephone Encounter (Signed)
Deductible $290($133met)  OOP MAX $3290 ($185met)  Annual exam NEEDS APPT  Calcium 10.1            Date 02/23/18  Upcoming dental procedures   Prior Authorization needed  NO  Pt estimated Cost $402  Per pervious not pt wanted other provider to do her Prolia. Will wait on pts call back to confirm that last injection was 10/09/2018 at Joy Thompson office     Coverage Details: $20% one dose, 20% admin fee       Joy Thompson sent to Joy Thompson, Joy Thompson        Patient wants to get her prolia injection here instead of her pcp office. They are not calling her back with setting this up so she wants to continue having it here.

## 2018-10-16 NOTE — Progress Notes (Signed)
Medical screening examination/treatment/procedure(s) were performed by non-physician practitioner and as supervising physician I was immediately available for consultation/collaboration. I agree with above. Aleksei Plotnikov, MD  

## 2018-10-26 ENCOUNTER — Other Ambulatory Visit: Payer: Self-pay

## 2018-10-27 ENCOUNTER — Encounter: Payer: Self-pay | Admitting: Gynecology

## 2018-10-27 ENCOUNTER — Ambulatory Visit (INDEPENDENT_AMBULATORY_CARE_PROVIDER_SITE_OTHER): Payer: Medicare Other | Admitting: Gynecology

## 2018-10-27 VITALS — BP 118/76 | Ht 62.0 in | Wt 104.0 lb

## 2018-10-27 DIAGNOSIS — N952 Postmenopausal atrophic vaginitis: Secondary | ICD-10-CM | POA: Diagnosis not present

## 2018-10-27 DIAGNOSIS — Z01419 Encounter for gynecological examination (general) (routine) without abnormal findings: Secondary | ICD-10-CM

## 2018-10-27 DIAGNOSIS — M810 Age-related osteoporosis without current pathological fracture: Secondary | ICD-10-CM | POA: Diagnosis not present

## 2018-10-27 NOTE — Patient Instructions (Signed)
Check with Dr. Alain Marion to see when he wants to repeat your bone density.  Follow-up in 1 year for annual exam.

## 2018-10-27 NOTE — Progress Notes (Signed)
    OCTOBER PEERY 1941-02-16 [id number]        78 y.o.  G2P2002 for annual gynecologic exam.  Without complaints  Past medical history,surgical history, problem list, medications, allergies, family history and social history were all reviewed and documented as reviewed in the EPIC chart.  ROS:  Performed with pertinent positives and negatives included in the history, assessment and plan.   Additional significant findings : None   Exam: [name] assistant Vitals:   10/27/18 0950  BP: 118/76  Weight: 104 lb (47.2 kg)  Height: 5\' 2"  (1.575 m)   Body mass index is 19.02 kg/m.  General appearance:  Normal affect, orientation and appearance. Skin: Grossly normal HEENT: Without gross lesions.  No cervical or supraclavicular adenopathy. Thyroid normal.  Lungs:  Clear without wheezing, rales or rhonchi Cardiac: RR, without RMG Abdominal:  Soft, nontender, without masses, guarding, rebound, organomegaly or hernia Breasts:  Examined lying and sitting without masses, retractions, discharge or axillary adenopathy. Pelvic:  Ext, BUS, Vagina: With atrophic changes  Cervix: With atrophic changes  Uterus: Anteverted, normal size, shape and contour, midline and mobile nontender   Adnexa: Without masses or tenderness    Anus and perineum: Normal   Rectovaginal: Normal sphincter tone without palpated masses or tenderness.    Assessment/Plan:  78 y.o. G42P2002 female for annual gynecologic exam.   1. Postmenopausal.  No significant menopausal symptoms or any vaginal bleeding. 2. Mammography 09/2018.  Continue with annual mammography next year.  Breast exam normal today. 3. Pap smear 2015.  No Pap smear done today.  No history of abnormal Pap smears.  We both agree to stop screening per current screening guidelines. 4. Colonoscopy 2019.  Repeat at their recommended interval. 5. Osteoporosis.  DEXA 2018 T score -2.5.  Improved from prior DEXA.  Continues on Prolia for approximately 3 years.   Followed by Dr. Alain Marion.  Recommend that she discuss with him when he wants to repeat her next DEXA. 6. Health maintenance.  No routine lab work done as patient does this elsewhere.  Follow-up 1 year, sooner as needed.   Anastasio Auerbach MD, 10:12 AM 10/27/2018

## 2018-12-04 ENCOUNTER — Other Ambulatory Visit: Payer: Self-pay

## 2018-12-04 ENCOUNTER — Other Ambulatory Visit (INDEPENDENT_AMBULATORY_CARE_PROVIDER_SITE_OTHER): Payer: Medicare Other

## 2018-12-04 ENCOUNTER — Ambulatory Visit (INDEPENDENT_AMBULATORY_CARE_PROVIDER_SITE_OTHER): Payer: Medicare Other | Admitting: Internal Medicine

## 2018-12-04 ENCOUNTER — Encounter: Payer: Self-pay | Admitting: Internal Medicine

## 2018-12-04 VITALS — BP 124/76 | HR 92 | Temp 98.7°F | Ht 62.0 in | Wt 104.0 lb

## 2018-12-04 DIAGNOSIS — E785 Hyperlipidemia, unspecified: Secondary | ICD-10-CM

## 2018-12-04 DIAGNOSIS — I2510 Atherosclerotic heart disease of native coronary artery without angina pectoris: Secondary | ICD-10-CM | POA: Diagnosis not present

## 2018-12-04 DIAGNOSIS — R946 Abnormal results of thyroid function studies: Secondary | ICD-10-CM

## 2018-12-04 DIAGNOSIS — M25471 Effusion, right ankle: Secondary | ICD-10-CM | POA: Diagnosis not present

## 2018-12-04 DIAGNOSIS — Z23 Encounter for immunization: Secondary | ICD-10-CM | POA: Diagnosis not present

## 2018-12-04 DIAGNOSIS — R7989 Other specified abnormal findings of blood chemistry: Secondary | ICD-10-CM | POA: Diagnosis not present

## 2018-12-04 DIAGNOSIS — R918 Other nonspecific abnormal finding of lung field: Secondary | ICD-10-CM

## 2018-12-04 DIAGNOSIS — I2583 Coronary atherosclerosis due to lipid rich plaque: Secondary | ICD-10-CM

## 2018-12-04 DIAGNOSIS — M810 Age-related osteoporosis without current pathological fracture: Secondary | ICD-10-CM

## 2018-12-04 LAB — BASIC METABOLIC PANEL
BUN: 10 mg/dL (ref 6–23)
CO2: 32 mEq/L (ref 19–32)
Calcium: 10.1 mg/dL (ref 8.4–10.5)
Chloride: 100 mEq/L (ref 96–112)
Creatinine, Ser: 0.74 mg/dL (ref 0.40–1.20)
GFR: 75.84 mL/min (ref >60.00)
Glucose, Bld: 99 mg/dL (ref 70–99)
Potassium: 4.1 mEq/L (ref 3.5–5.1)
Sodium: 142 mEq/L (ref 135–145)

## 2018-12-04 LAB — HEPATIC FUNCTION PANEL
ALT: 18 U/L (ref 0–35)
AST: 21 U/L (ref 0–37)
Albumin: 4.2 g/dL (ref 3.5–5.2)
Alkaline Phosphatase: 64 U/L (ref 39–117)
Bilirubin, Direct: 0.1 mg/dL (ref 0.0–0.3)
Total Bilirubin: 0.4 mg/dL (ref 0.2–1.2)
Total Protein: 7 g/dL (ref 6.0–8.3)

## 2018-12-04 LAB — LIPID PANEL
Cholesterol: 125 mg/dL (ref 0–200)
HDL: 63.5 mg/dL (ref >39.00)
LDL Cholesterol: 46 mg/dL (ref 0–99)
NonHDL: 61.27
Total CHOL/HDL Ratio: 2
Triglycerides: 76 mg/dL (ref 0.0–149.0)
VLDL: 15.2 mg/dL (ref 0.0–40.0)

## 2018-12-04 LAB — T4, FREE: Free T4: 1.08 ng/dL (ref 0.60–1.60)

## 2018-12-04 LAB — TSH: TSH: 3.1 u[IU]/mL (ref 0.35–4.50)
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
## 2018-12-04 NOTE — Addendum Note (Signed)
Addended by: Karren Cobble on: 12/04/2018 11:38 AM   Modules accepted: Orders

## 2018-12-04 NOTE — Patient Instructions (Addendum)
   Mediterranean diet is good for you. ([employer] has a typical Mediterranean cuisine menu) The Mediterranean diet is a way of eating based on the traditional cuisine of countries bordering the [employer]. While there is no single definition of the Mediterranean diet, it is typically high in vegetables, fruits, whole grains, beans, nut and seeds, and olive oil. The main components of Mediterranean diet include: Marland Kitchen Daily consumption of vegetables, fruits, whole grains and healthy fats  . Weekly intake of fish, poultry, beans and eggs  . Moderate portions of dairy products  . Limited intake of red meat Other important elements of the Mediterranean diet are sharing meals with family and friends, enjoying a glass of red wine and being physically active. Health benefits of a Mediterranean diet: A traditional Mediterranean diet consisting of large quantities of fresh fruits and vegetables, nuts, fish and olive oil-coupled with physical activity-can reduce your risk of serious mental and physical health problems by: Preventing heart disease and strokes. Following a Mediterranean diet limits your intake of refined breads, processed foods, and red meat, and encourages drinking red wine instead of hard liquor-all factors that can help prevent heart disease and stroke. Keeping you agile. If you're an older adult, the nutrients gained with a Mediterranean diet may reduce your risk of developing muscle weakness and other signs of frailty by about 70 percent. Reducing the risk of Alzheimer's. Research suggests that the [place] diet may improve cholesterol, blood sugar levels, and overall blood vessel health, which in turn may reduce your risk of Alzheimer's disease or dementia. Halving the risk of Parkinson's disease. The high levels of antioxidants in the Mediterranean diet can prevent cells from undergoing a damaging process called oxidative stress, thereby cutting the risk of Parkinson's disease  in half. Increasing longevity. By reducing your risk of developing heart disease or cancer with the Mediterranean diet, you're reducing your risk of death at any age by 20%. Protecting against type 2 diabetes. A Mediterranean diet is rich in fiber which digests slowly, prevents huge swings in blood sugar, and can help you maintain a healthy weight.  If you have medicare related insurance (such as traditional Medicare, [employer], [employer], or similar), Please make an appointment at the scheduling desk with Sharee Pimple, the [employer], for your Wellness visit in this office, which is a benefit with your insurance.

## 2018-12-04 NOTE — Assessment & Plan Note (Addendum)
Coronary calcium score of 217 - 2020 [facility]
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
## 2018-12-28 ENCOUNTER — Ambulatory Visit (INDEPENDENT_AMBULATORY_CARE_PROVIDER_SITE_OTHER): Payer: Medicare Other | Admitting: Ophthalmology

## 2018-12-28 ENCOUNTER — Encounter: Payer: Self-pay | Admitting: Gynecology

## 2018-12-28 ENCOUNTER — Encounter (INDEPENDENT_AMBULATORY_CARE_PROVIDER_SITE_OTHER): Payer: Self-pay | Admitting: Ophthalmology

## 2018-12-28 DIAGNOSIS — Z961 Presence of intraocular lens: Secondary | ICD-10-CM | POA: Diagnosis not present

## 2018-12-28 DIAGNOSIS — H3581 Retinal edema: Secondary | ICD-10-CM

## 2018-12-28 DIAGNOSIS — H33322 Round hole, left eye: Secondary | ICD-10-CM

## 2019-01-09 ENCOUNTER — Other Ambulatory Visit: Payer: Self-pay

## 2019-01-09 ENCOUNTER — Ambulatory Visit (INDEPENDENT_AMBULATORY_CARE_PROVIDER_SITE_OTHER)
Admission: RE | Admit: 2019-01-09 | Discharge: 2019-01-09 | Disposition: A | Discharge status: Home / Self Care | Payer: Medicare Other | Source: Ambulatory Visit | Attending: Internal Medicine | Admitting: Internal Medicine

## 2019-01-09 DIAGNOSIS — M810 Age-related osteoporosis without current pathological fracture: Secondary | ICD-10-CM | POA: Diagnosis not present

## 2019-03-05 ENCOUNTER — Telehealth: Payer: Self-pay | Admitting: Internal Medicine

## 2019-03-05 NOTE — Telephone Encounter (Signed)
Spoke to pt and she states she should be due soon for her next prolia injection.  Can you please call pt to discuss?

## 2019-03-05 NOTE — Telephone Encounter (Signed)
Patient advised that she will need to wait 6 months and one day past her last injection in July,2020----she is able to get next injection on/after April 12, 2019----however, we will need to reverify her insurance after the first start of year 2021 to make sure her coverage has not changed, once we have summary of benefits for 2021 from her insurance, we will call her back and schedule injection at that time

## 2019-03-19 ENCOUNTER — Other Ambulatory Visit: Payer: Self-pay

## 2019-03-19 ENCOUNTER — Ambulatory Visit (INDEPENDENT_AMBULATORY_CARE_PROVIDER_SITE_OTHER)
Admission: RE | Admit: 2019-03-19 | Discharge: 2019-03-19 | Disposition: A | Discharge status: Home / Self Care | Payer: Medicare Other | Source: Ambulatory Visit | Attending: Internal Medicine | Admitting: Internal Medicine

## 2019-03-19 DIAGNOSIS — R918 Other nonspecific abnormal finding of lung field: Secondary | ICD-10-CM | POA: Diagnosis not present

## 2019-04-30 ENCOUNTER — Ambulatory Visit: Payer: Medicare Other

## 2019-05-17 ENCOUNTER — Ambulatory Visit: Payer: Medicare Other

## 2019-06-05 ENCOUNTER — Ambulatory Visit: Payer: Medicare Other | Admitting: Internal Medicine

## 2019-06-05 ENCOUNTER — Encounter: Payer: Self-pay | Admitting: Internal Medicine

## 2019-06-05 ENCOUNTER — Other Ambulatory Visit: Payer: Self-pay

## 2019-06-05 VITALS — BP 154/86 | HR 95 | Temp 98.7°F | Ht 62.0 in | Wt 105.0 lb

## 2019-06-05 DIAGNOSIS — I2583 Coronary atherosclerosis due to lipid rich plaque: Secondary | ICD-10-CM | POA: Diagnosis not present

## 2019-06-05 DIAGNOSIS — I2510 Atherosclerotic heart disease of native coronary artery without angina pectoris: Secondary | ICD-10-CM

## 2019-06-05 DIAGNOSIS — M810 Age-related osteoporosis without current pathological fracture: Secondary | ICD-10-CM | POA: Diagnosis not present

## 2019-06-05 DIAGNOSIS — R7989 Other specified abnormal findings of blood chemistry: Secondary | ICD-10-CM | POA: Diagnosis not present

## 2019-06-05 LAB — T4, FREE: Free T4: 0.95 ng/dL (ref 0.60–1.60)

## 2019-06-05 LAB — TSH: TSH: 5 u[IU]/mL — ABNORMAL HIGH (ref 0.35–4.50)

## 2019-06-05 MED ORDER — DENOSUMAB 60 MG/ML ~~LOC~~ SOSY
60.0000 mg | PREFILLED_SYRINGE | Freq: Once | SUBCUTANEOUS | Status: AC
  Administered 2019-06-05: 60 mg via SUBCUTANEOUS

## 2019-06-05 MED ORDER — ROSUVASTATIN CALCIUM 5 MG PO TABS: 2.5000 mg | ORAL_TABLET | Freq: Every day | ORAL | 3 refills | Status: DC

## 2019-06-05 NOTE — Addendum Note (Signed)
Addended by: Karren Cobble on: 06/05/2019 09:07 AM   Modules accepted: Orders

## 2019-06-05 NOTE — Assessment & Plan Note (Signed)
Crestor,  ASA 

## 2019-06-05 NOTE — Assessment & Plan Note (Signed)
Prolia Vit D 

## 2019-06-05 NOTE — Progress Notes (Signed)
Subjective:  Patient ID: Joy Thompson, female    DOB: [birth year]  Age: 79 y.o. MRN: [id number]  CC: No chief complaint on file.   HPI Joy Thompson presents for elevated BP - nl BP at home;  CAD,  osteoporosis  Outpatient Medications Prior to Visit  Medication Sig Dispense Refill  . aspirin 81 MG EC tablet Take 81 mg by mouth daily.      . calcium-vitamin D (OSCAL WITH D 500-200) 500-200 MG-UNIT per tablet Take 1.5 tablets by mouth.     . Cholecalciferol 1000 UNITS tablet Take 1,000 Units by mouth daily.      Marland Kitchen CRANBERRY PO Take 1 tablet by mouth daily.    Marland Kitchen denosumab (PROLIA) 60 MG/ML SOLN injection Inject 60 mg into the skin every 6 (six) months. Administer in upper arm, thigh, or abdomen    . Multiple Vitamins-Minerals (MULTIVITAMIN,TX-MINERALS) tablet Take 1 tablet by mouth daily.      . rosuvastatin (CRESTOR) 5 MG tablet Take 0.5 tablets (2.5 mg total) by mouth daily. 45 tablet 3   Facility-Administered Medications Prior to Visit  Medication Dose Route Frequency Provider Last Rate Last Admin  . 0.9 %  sodium chloride infusion  500 mL Intravenous Once Gatha Mayer, MD        ROS: Review of Systems  Objective:  BP (!) 154/86 (BP Location: Left Arm, Patient Position: Sitting, Cuff Size: Normal)   Pulse 95   Temp 98.7 F (37.1 C) (Oral)   Ht 5\' 2"  (1.575 m)   Wt 105 lb (47.6 kg)   SpO2 95%   BMI 19.20 kg/m   BP Readings from Last 3 Encounters:  06/05/19 (!) 154/86  12/04/18 124/76  10/27/18 118/76    Wt Readings from Last 3 Encounters:  06/05/19 105 lb (47.6 kg)  12/04/18 104 lb (47.2 kg)  10/27/18 104 lb (47.2 kg)    Physical Exam  Lab Results  Component Value Date   WBC 6.5 02/23/2018   HGB 14.6 02/23/2018   HCT 43.7 02/23/2018   PLT 297.0 02/23/2018   GLUCOSE 99 12/04/2018   CHOL 125 12/04/2018   TRIG 76.0 12/04/2018   HDL 63.50 12/04/2018   LDLCALC 46 12/04/2018   ALT 18 12/04/2018   AST 21 12/04/2018   NA 142 12/04/2018   K 4.1  12/04/2018   CL 100 12/04/2018   CREATININE 0.74 12/04/2018   BUN 10 12/04/2018   CO2 32 12/04/2018   TSH 3.10 12/04/2018    CT Chest Wo Contrast  Result Date: 03/19/2019 CLINICAL DATA:  Follow-up lung nodules EXAM: CT CHEST WITHOUT CONTRAST TECHNIQUE: Multidetector CT imaging of the chest was performed following the standard protocol without IV contrast. COMPARISON:  CT coronary calcium score studies 10/06/2018 FINDINGS: Cardiovascular: Heart is normal size. Aorta is normal caliber. Extensive coronary artery and scattered aortic calcifications. Mediastinum/Nodes: No mediastinal, hilar, or axillary adenopathy. Trachea and esophagus are unremarkable. Thyroid unremarkable. Lungs/Pleura: Biapical scarring triangular shaped nodule anteriorly in the right middle lobe measures 6 mm and is stable. Areas of scarring in the right middle lobe and lingula. Clustered nodules posteriorly in the right lower lobe are stable small anterior left upper lobe nodule appears calcified on today's study compatible with granuloma. No new or enlarging pulmonary nodules. No effusions. Upper Abdomen: Imaging into the upper abdomen shows no acute findings. Musculoskeletal: Chest wall soft tissues are unremarkable. No acute bony abnormality. IMPRESSION: Numerous bilateral pulmonary nodules, the largest 6 mm in the anterior right  middle lobe. These are unchanged since prior study and most likely post infectious/inflammatory. Continued follow-up could be performed with repeat CT in 12 months. Extensive coronary artery disease Aortic Atherosclerosis (ICD10-I70.0). Electronically Signed   By: Rolm Baptise M.D.   On: 03/19/2019 12:24    Assessment & Plan:   Diagnoses and all orders for this visit:  Osteoporosis, unspecified osteoporosis type, unspecified pathological fracture presence -     denosumab (PROLIA) injection 60 mg     Meds ordered this encounter  Medications  . denosumab (PROLIA) injection 60 mg      Follow-up: No follow-ups on file.  Walker Kehr, MD

## 2019-09-05 ENCOUNTER — Other Ambulatory Visit: Payer: Self-pay | Admitting: Internal Medicine

## 2019-09-05 MED ORDER — SHINGRIX 50 MCG/0.5ML IM SUSR: 0.5000 mL | Freq: Once | INTRAMUSCULAR | 1 refills | Status: AC

## 2019-10-01 LAB — HM MAMMOGRAPHY

## 2019-10-03 ENCOUNTER — Encounter: Payer: Self-pay | Admitting: Internal Medicine

## 2019-10-30 ENCOUNTER — Encounter: Payer: Medicare Other | Admitting: Gynecology

## 2019-10-30 ENCOUNTER — Encounter: Payer: Medicare Other | Admitting: Obstetrics and Gynecology

## 2019-10-31 ENCOUNTER — Encounter: Payer: Self-pay | Admitting: Obstetrics and Gynecology

## 2019-10-31 ENCOUNTER — Ambulatory Visit: Payer: Medicare Other | Admitting: Obstetrics and Gynecology

## 2019-10-31 ENCOUNTER — Other Ambulatory Visit: Payer: Self-pay

## 2019-10-31 VITALS — BP 124/80 | Ht 62.0 in | Wt 104.0 lb

## 2019-10-31 DIAGNOSIS — M810 Age-related osteoporosis without current pathological fracture: Secondary | ICD-10-CM | POA: Diagnosis not present

## 2019-10-31 DIAGNOSIS — Z01419 Encounter for gynecological examination (general) (routine) without abnormal findings: Secondary | ICD-10-CM | POA: Diagnosis not present

## 2019-10-31 NOTE — Progress Notes (Signed)
Joy Thompson November 19, 1940 [id number]  SUBJECTIVE:  79 y.o. G2P2002 female for annual routine gynecologic exam. She has no gynecologic concerns.  Current Outpatient Medications  Medication Sig Dispense Refill  . aspirin 81 MG EC tablet Take 81 mg by mouth daily.      . calcium-vitamin D (OSCAL WITH D 500-200) 500-200 MG-UNIT per tablet Take 1.5 tablets by mouth.     . Cholecalciferol 1000 UNITS tablet Take 1,000 Units by mouth daily.      Marland Kitchen CRANBERRY PO Take 1 tablet by mouth daily.    Marland Kitchen denosumab (PROLIA) 60 MG/ML SOLN injection Inject 60 mg into the skin every 6 (six) months. Administer in upper arm, thigh, or abdomen    . Multiple Vitamins-Minerals (MULTIVITAMIN,TX-MINERALS) tablet Take 1 tablet by mouth daily.      . rosuvastatin (CRESTOR) 5 MG tablet Take 0.5 tablets (2.5 mg total) by mouth daily. 45 tablet 3   Current Facility-Administered Medications  Medication Dose Route Frequency Provider Last Rate Last Admin  . 0.9 %  sodium chloride infusion  500 mL Intravenous Once Gatha Mayer, MD       Allergies: Orudis [ketoprofen]  No LMP recorded. Patient is postmenopausal.  Past medical history,surgical history, problem list, medications, allergies, family history and social history were all reviewed and documented as reviewed in the EPIC chart.  ROS:  Feeling well. No dyspnea or chest pain on exertion.  No abdominal pain, change in bowel habits, black or bloody stools.  No urinary tract symptoms. GYN ROS: no abnormal bleeding, pelvic pain or discharge, no breast pain or new or enlarging lumps on self exam.No neurological complaints.   OBJECTIVE:  BP 124/80   Ht 5\' 2"  (1.575 m)   Wt 104 lb (47.2 kg)   BMI 19.02 kg/m  The patient appears well, alert, oriented x 3, in no distress. ENT normal.  Neck supple. No cervical or supraclavicular adenopathy or thyromegaly.  Lungs are clear, good air entry, no wheezes, rhonchi or rales. S1 and S2 normal, no murmurs, regular rate and  rhythm.  Abdomen soft without tenderness, guarding, mass or organomegaly.  Neurological is normal, no focal findings.  BREAST EXAM: breasts appear normal, no suspicious masses, no skin or nipple changes or axillary nodes  PELVIC EXAM: VULVA: normal appearing vulva with no masses, tenderness or lesions, atrophic changes, VAGINA: normal appearing atrophic vagina with normal color and discharge, no lesions, CERVIX: normal appearing atrophic cervix without discharge or lesions, UTERUS: uterus is normal size, shape, consistency and nontender, ADNEXA: normal adnexa in size, nontender and no masses  Chaperone: Aurora Mask (DNP student) present during the examination and performed the pelvic exam with me in attendance to confirm the exam findings   ASSESSMENT:  79 y.o. [id number] here for annual gynecologic exam  PLAN:   1. Postmenopausal.  No hot flashes or night sweats.  No vaginal bleeding. 2. Pap smear 2015.  No Pap smear done today.  No history of abnormal Pap smears.  We both agree to stop screening per current screening guidelines. 3. Mammogram 09/2019.  Normal breast exam today.  Will continue with annual mammography. 4. Colonoscopy 2019.  Recommended that she follow up at the recommended interval.   5. Osteoporosis.  DEXA 2020 T score -1.3, showing improvement from 2018.  Improved from prior DEXA.  Continues on Prolia for approximately 4 years.  Followed by Dr. Alain Marion.  She will discuss with him when he wants to repeat her next DEXA. 6. Health maintenance.  No labs today as she normally has these completed elsewhere.  Return annually or sooner, prn.  Joseph Pierini MD 10/31/19

## 2019-12-05 ENCOUNTER — Telehealth: Payer: Self-pay | Admitting: Internal Medicine

## 2019-12-05 NOTE — Telephone Encounter (Signed)
LVM for pt to rtn my call to schedule AWV with [facility].  

## 2019-12-06 ENCOUNTER — Encounter: Payer: Self-pay | Admitting: Internal Medicine

## 2019-12-06 ENCOUNTER — Ambulatory Visit (INDEPENDENT_AMBULATORY_CARE_PROVIDER_SITE_OTHER): Payer: Medicare Other | Admitting: Internal Medicine

## 2019-12-06 ENCOUNTER — Other Ambulatory Visit: Payer: Self-pay

## 2019-12-06 VITALS — BP 160/80 | HR 88 | Temp 98.0°F | Ht 62.0 in | Wt 102.0 lb

## 2019-12-06 DIAGNOSIS — M810 Age-related osteoporosis without current pathological fracture: Secondary | ICD-10-CM

## 2019-12-06 DIAGNOSIS — I2510 Atherosclerotic heart disease of native coronary artery without angina pectoris: Secondary | ICD-10-CM | POA: Diagnosis not present

## 2019-12-06 DIAGNOSIS — R030 Elevated blood-pressure reading, without diagnosis of hypertension: Secondary | ICD-10-CM

## 2019-12-06 DIAGNOSIS — R7989 Other specified abnormal findings of blood chemistry: Secondary | ICD-10-CM

## 2019-12-06 DIAGNOSIS — Z23 Encounter for immunization: Secondary | ICD-10-CM

## 2019-12-06 DIAGNOSIS — I2583 Coronary atherosclerosis due to lipid rich plaque: Secondary | ICD-10-CM

## 2019-12-06 MED ORDER — DENOSUMAB 60 MG/ML ~~LOC~~ SOSY
60.0000 mg | PREFILLED_SYRINGE | Freq: Once | SUBCUTANEOUS | Status: AC
  Administered 2019-12-06: 60 mg via SUBCUTANEOUS

## 2019-12-06 NOTE — Assessment & Plan Note (Signed)
Prolia Vit D 

## 2019-12-06 NOTE — Assessment & Plan Note (Signed)
Labs

## 2019-12-06 NOTE — Addendum Note (Signed)
Addended by: Hazle Quant on: 12/06/2019 10:12 AM   Modules accepted: Orders

## 2019-12-06 NOTE — Progress Notes (Signed)
Subjective:  Patient ID: Joy Thompson, female    DOB: 04-25-1940  Age: 79 y.o. MRN: [id number]  CC: No chief complaint on file.   HPI MECCA GUITRON presents for osteoporosis, dyslipidemia, CAD f/u BP nl at home  Outpatient Medications Prior to Visit  Medication Sig Dispense Refill  . aspirin 81 MG EC tablet Take 81 mg by mouth daily.      . calcium-vitamin D (OSCAL WITH D 500-200) 500-200 MG-UNIT per tablet Take 1.5 tablets by mouth.     . Cholecalciferol 1000 UNITS tablet Take 1,000 Units by mouth daily.      Marland Kitchen CRANBERRY PO Take 1 tablet by mouth daily.    Marland Kitchen denosumab (PROLIA) 60 MG/ML SOLN injection Inject 60 mg into the skin every 6 (six) months. Administer in upper arm, thigh, or abdomen    . Multiple Vitamins-Minerals (MULTIVITAMIN,TX-MINERALS) tablet Take 1 tablet by mouth daily.      . rosuvastatin (CRESTOR) 5 MG tablet Take 0.5 tablets (2.5 mg total) by mouth daily. 45 tablet 3   Facility-Administered Medications Prior to Visit  Medication Dose Route Frequency Provider Last Rate Last Admin  . 0.9 %  sodium chloride infusion  500 mL Intravenous Once Gatha Mayer, MD        ROS: Review of Systems  Constitutional: Negative for activity change, appetite change, chills, fatigue and unexpected weight change.  HENT: Negative for congestion, mouth sores and sinus pressure.   Eyes: Negative for visual disturbance.  Respiratory: Negative for cough and chest tightness.   Gastrointestinal: Negative for abdominal pain and nausea.  Genitourinary: Negative for difficulty urinating, frequency and vaginal pain.  Musculoskeletal: Negative for back pain and gait problem.  Skin: Negative for pallor and rash.  Neurological: Negative for dizziness, tremors, weakness, numbness and headaches.  Psychiatric/Behavioral: Negative for confusion, sleep disturbance and suicidal ideas.    Objective:  BP (!) 160/80 (BP Location: Left Arm, Patient Position: Sitting, Cuff Size: Normal)    Pulse 88   Temp 98 F (36.7 C) (Oral)   Ht 5\' 2"  (1.575 m)   Wt 102 lb (46.3 kg)   SpO2 96%   BMI 18.66 kg/m   BP Readings from Last 3 Encounters:  12/06/19 (!) 160/80  10/31/19 124/80  06/05/19 (!) 154/86    Wt Readings from Last 3 Encounters:  12/06/19 102 lb (46.3 kg)  10/31/19 104 lb (47.2 kg)  06/05/19 105 lb (47.6 kg)    Physical Exam Constitutional:      General: She is not in acute distress.    Appearance: She is well-developed.  HENT:     Head: Normocephalic.     Right Ear: External ear normal.     Left Ear: External ear normal.     Nose: Nose normal.  Eyes:     General:        Right eye: No discharge.        Left eye: No discharge.     Conjunctiva/sclera: Conjunctivae normal.     Pupils: Pupils are equal, round, and reactive to light.  Neck:     Thyroid: No thyromegaly.     Vascular: No JVD.     Trachea: No tracheal deviation.  Cardiovascular:     Rate and Rhythm: Normal rate and regular rhythm.     Heart sounds: Normal heart sounds.  Pulmonary:     Effort: No respiratory distress.     Breath sounds: No stridor. No wheezing.  Abdominal:  General: Bowel sounds are normal. There is no distension.     Palpations: Abdomen is soft. There is no mass.     Tenderness: There is no abdominal tenderness. There is no guarding or rebound.  Musculoskeletal:        General: No tenderness.     Cervical back: Normal range of motion and neck supple.  Lymphadenopathy:     Cervical: No cervical adenopathy.  Skin:    Findings: No erythema or rash.  Neurological:     Cranial Nerves: No cranial nerve deficit.     Motor: No abnormal muscle tone.     Coordination: Coordination normal.     Deep Tendon Reflexes: Reflexes normal.  Psychiatric:        Behavior: Behavior normal.        Thought Content: Thought content normal.        Judgment: Judgment normal.   thin  Lab Results  Component Value Date   WBC 6.5 02/23/2018   HGB 14.6 02/23/2018   HCT 43.7  02/23/2018   PLT 297.0 02/23/2018   GLUCOSE 99 12/04/2018   CHOL 125 12/04/2018   TRIG 76.0 12/04/2018   HDL 63.50 12/04/2018   LDLCALC 46 12/04/2018   ALT 18 12/04/2018   AST 21 12/04/2018   NA 142 12/04/2018   K 4.1 12/04/2018   CL 100 12/04/2018   CREATININE 0.74 12/04/2018   BUN 10 12/04/2018   CO2 32 12/04/2018   TSH 5.00 (H) 06/05/2019    CT Chest Wo Contrast  Result Date: 03/19/2019 CLINICAL DATA:  Follow-up lung nodules EXAM: CT CHEST WITHOUT CONTRAST TECHNIQUE: Multidetector CT imaging of the chest was performed following the standard protocol without IV contrast. COMPARISON:  CT coronary calcium score studies 10/06/2018 FINDINGS: Cardiovascular: Heart is normal size. Aorta is normal caliber. Extensive coronary artery and scattered aortic calcifications. Mediastinum/Nodes: No mediastinal, hilar, or axillary adenopathy. Trachea and esophagus are unremarkable. Thyroid unremarkable. Lungs/Pleura: Biapical scarring triangular shaped nodule anteriorly in the right middle lobe measures 6 mm and is stable. Areas of scarring in the right middle lobe and lingula. Clustered nodules posteriorly in the right lower lobe are stable small anterior left upper lobe nodule appears calcified on today's study compatible with granuloma. No new or enlarging pulmonary nodules. No effusions. Upper Abdomen: Imaging into the upper abdomen shows no acute findings. Musculoskeletal: Chest wall soft tissues are unremarkable. No acute bony abnormality. IMPRESSION: Numerous bilateral pulmonary nodules, the largest 6 mm in the anterior right middle lobe. These are unchanged since prior study and most likely post infectious/inflammatory. Continued follow-up could be performed with repeat CT in 12 months. Extensive coronary artery disease Aortic Atherosclerosis (ICD10-I70.0). Electronically Signed   By: Rolm Baptise M.D.   On: 03/19/2019 12:24    Assessment & Plan:     Follow-up: No follow-ups on file.  Walker Kehr, MD

## 2019-12-06 NOTE — Assessment & Plan Note (Signed)
BP nl at home Crestor, ASA

## 2019-12-06 NOTE — Addendum Note (Signed)
Addended by: Lauralee Evener C on: 12/06/2019 10:05 AM   Modules accepted: Orders

## 2019-12-06 NOTE — Assessment & Plan Note (Signed)
  BP nl at home 

## 2019-12-07 LAB — CBC WITH DIFFERENTIAL/PLATELET
Absolute Monocytes: 689 cells/uL (ref 200–950)
Basophils Absolute: 57 cells/uL (ref 0–200)
Basophils Relative: 0.7 %
Eosinophils Absolute: 162 cells/uL (ref 15–500)
Eosinophils Relative: 2 %
HCT: 41.8 % (ref 35.0–45.0)
Hemoglobin: 13.8 g/dL (ref 11.7–15.5)
Lymphs Abs: 1474 cells/uL (ref 850–3900)
MCH: 29.8 pg (ref 27.0–33.0)
MCHC: 33 g/dL (ref 32.0–36.0)
MCV: 90.3 fL (ref 80.0–100.0)
MPV: 9.7 fL (ref 7.5–12.5)
Monocytes Relative: 8.5 %
Neutro Abs: 5719 cells/uL (ref 1500–7800)
Neutrophils Relative %: 70.6 %
Platelets: 318 10*3/uL (ref 140–400)
RBC: 4.63 10*6/uL (ref 3.80–5.10)
RDW: 12.3 % (ref 11.0–15.0)
Total Lymphocyte: 18.2 %
WBC: 8.1 10*3/uL (ref 3.8–10.8)

## 2019-12-07 LAB — COMPLETE METABOLIC PANEL WITH GFR
AG Ratio: 2.1 (calc) (ref 1.0–2.5)
ALT: 20 U/L (ref 6–29)
AST: 24 U/L (ref 10–35)
Albumin: 4.5 g/dL (ref 3.6–5.1)
Alkaline phosphatase (APISO): 63 U/L (ref 37–153)
BUN: 8 mg/dL (ref 7–25)
CO2: 26 mmol/L (ref 20–32)
Calcium: 9.5 mg/dL (ref 8.6–10.4)
Chloride: 100 mmol/L (ref 98–110)
Creat: 0.66 mg/dL (ref 0.60–0.93)
GFR, Est African American: 97 mL/min/{1.73_m2} (ref >=60)
GFR, Est Non African American: 84 mL/min/{1.73_m2} (ref >=60)
Globulin: 2.1 g/dL (calc) (ref 1.9–3.7)
Glucose, Bld: 87 mg/dL (ref 65–99)
Potassium: 4.2 mmol/L (ref 3.5–5.3)
Sodium: 142 mmol/L (ref 135–146)
Total Bilirubin: 0.6 mg/dL (ref 0.2–1.2)
Total Protein: 6.6 g/dL (ref 6.1–8.1)

## 2019-12-07 LAB — LIPID PANEL
Cholesterol: 134 mg/dL (ref <200)
HDL: 75 mg/dL (ref >=50)
LDL Cholesterol (Calc): 42 mg/dL (calc)
Non-HDL Cholesterol (Calc): 59 mg/dL (calc) (ref <130)
Total CHOL/HDL Ratio: 1.8 (calc) (ref <5.0)
Triglycerides: 86 mg/dL (ref <150)

## 2019-12-07 LAB — URINALYSIS
Bilirubin Urine: NEGATIVE
Glucose, UA: NEGATIVE
Hgb urine dipstick: NEGATIVE
Ketones, ur: NEGATIVE
Nitrite: POSITIVE — AB
Protein, ur: NEGATIVE
Specific Gravity, Urine: 1.006 (ref 1.001–1.03)
pH: 7 (ref 5.0–8.0)

## 2019-12-07 LAB — T4, FREE: Free T4: 1.3 ng/dL (ref 0.8–1.8)

## 2019-12-07 LAB — TSH: TSH: 3.86 mIU/L (ref 0.40–4.50)

## 2019-12-26 ENCOUNTER — Other Ambulatory Visit: Payer: Self-pay

## 2019-12-26 ENCOUNTER — Ambulatory Visit (INDEPENDENT_AMBULATORY_CARE_PROVIDER_SITE_OTHER): Payer: Medicare Other

## 2019-12-26 DIAGNOSIS — Z0000 Encounter for general adult medical examination without abnormal findings: Secondary | ICD-10-CM | POA: Diagnosis not present
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
## 2019-12-26 NOTE — Patient Instructions (Addendum)
Joy Thompson , Thank you for taking time to come for your Medicare Wellness Visit. I appreciate your ongoing commitment to your health goals. Please review the following plan we discussed and let me know if I can assist you in the future.   Screening recommendations/referrals: Colonoscopy: 04/19/2017; no repeat due to age or unless there is a problem Mammogram: 10/01/2019 Bone Density: 01/09/2019 Recommended yearly ophthalmology/optometry visit for glaucoma screening and checkup Recommended yearly dental visit for hygiene and checkup  Vaccinations: Influenza vaccine: 12/06/2019 Pneumococcal vaccine: completed Tdap vaccine: 08/28/2014; due every 10 years Shingles vaccine: completed   Covid-19: completed  Advanced directives: Please bring a copy of your health care power of attorney and living will to the office at your convenience.  Conditions/risks identified: Yes; Reviewed health maintenance screenings with patient today and relevant education, vaccines, and/or referrals were provided. Please continue to do your personal lifestyle choices by: daily care of teeth and gums, regular physical activity (goal should be 5 days a week for 30 minutes), eat a healthy diet, avoid tobacco and drug use, limiting any alcohol intake, taking a low-dose aspirin (if not allergic or have been advised by your provider otherwise) and taking vitamins and minerals as recommended by your provider. Continue doing brain stimulating activities (puzzles, reading, adult coloring books, staying active) to keep memory sharp. Continue to eat heart healthy diet (full of fruits, vegetables, whole grains, lean protein, water--limit salt, fat, and sugar intake) and increase physical activity as tolerated.  Next appointment: Please schedule your next Medicare Wellness Visit with your Nurse Health Advisor in 1 year by calling [phone].  Preventive Care 40 Years and Older, Female Preventive care refers to lifestyle choices and visits  with your health care provider that can promote health and wellness. What does preventive care include?  A yearly physical exam. This is also called an annual well check.  Dental exams once or twice a year.  Routine eye exams. Ask your health care provider how often you should have your eyes checked.  Personal lifestyle choices, including:  Daily care of your teeth and gums.  Regular physical activity.  Eating a healthy diet.  Avoiding tobacco and drug use.  Limiting alcohol use.  Practicing safe sex.  Taking low-dose aspirin every day.  Taking vitamin and mineral supplements as recommended by your health care provider. What happens during an annual well check? The services and screenings done by your health care provider during your annual well check will depend on your age, overall health, lifestyle risk factors, and family history of disease. Counseling  Your health care provider may ask you questions about your:  Alcohol use.  Tobacco use.  Drug use.  Emotional well-being.  Home and relationship well-being.  Sexual activity.  Eating habits.  History of falls.  Memory and ability to understand (cognition).  Work and work Statistician.  Reproductive health. Screening  You may have the following tests or measurements:  Height, weight, and BMI.  Blood pressure.  Lipid and cholesterol levels. These may be checked every 5 years, or more frequently if you are over 63 years old.  Skin check.  Lung cancer screening. You may have this screening every year starting at age 34 if you have a 30-pack-year history of smoking and currently smoke or have quit within the past 15 years.  Fecal occult blood test (FOBT) of the stool. You may have this test every year starting at age 12.  Flexible sigmoidoscopy or colonoscopy. You may have a sigmoidoscopy every  5 years or a colonoscopy every 10 years starting at age 11.  Hepatitis C blood test.  Hepatitis B blood  test.  Sexually transmitted disease (STD) testing.  Diabetes screening. This is done by checking your blood sugar (glucose) after you have not eaten for a while (fasting). You may have this done every 1-3 years.  Bone density scan. This is done to screen for osteoporosis. You may have this done starting at age 78.  Mammogram. This may be done every 1-2 years. Talk to your health care provider about how often you should have regular mammograms. Talk with your health care provider about your test results, treatment options, and if necessary, the need for more tests. Vaccines  Your health care provider may recommend certain vaccines, such as:  Influenza vaccine. This is recommended every year.  Tetanus, diphtheria, and acellular pertussis (Tdap, Td) vaccine. You may need a Td booster every 10 years.  Zoster vaccine. You may need this after age 66.  Pneumococcal 13-valent conjugate (PCV13) vaccine. One dose is recommended after age 62.  Pneumococcal polysaccharide (PPSV23) vaccine. One dose is recommended after age 41. Talk to your health care provider about which screenings and vaccines you need and how often you need them. This information is not intended to replace advice given to you by your health care provider. Make sure you discuss any questions you have with your health care provider. Document Released: 04/04/2015 Document Revised: 11/26/2015 Document Reviewed: 01/07/2015 Elsevier Interactive Patient Education  2017 [place] Prevention in the Home Falls can cause injuries. They can happen to people of all ages. There are many things you can do to make your home safe and to help prevent falls. What can I do on the outside of my home?  Regularly fix the edges of walkways and driveways and fix any cracks.  Remove anything that might make you trip as you walk through a door, such as a raised step or threshold.  Trim any bushes or trees on the path to your home.  Use  bright outdoor lighting.  Clear any walking paths of anything that might make someone trip, such as rocks or tools.  Regularly check to see if handrails are loose or broken. Make sure that both sides of any steps have handrails.  Any raised decks and porches should have guardrails on the edges.  Have any leaves, snow, or ice cleared regularly.  Use sand or salt on walking paths during winter.  Clean up any spills in your garage right away. This includes oil or grease spills. What can I do in the bathroom?  Use night lights.  Install grab bars by the toilet and in the tub and shower. Do not use towel bars as grab bars.  Use non-skid mats or decals in the tub or shower.  If you need to sit down in the shower, use a plastic, non-slip stool.  Keep the floor dry. Clean up any water that spills on the floor as soon as it happens.  Remove soap buildup in the tub or shower regularly.  Attach bath mats securely with double-sided non-slip rug tape.  Do not have throw rugs and other things on the floor that can make you trip. What can I do in the bedroom?  Use night lights.  Make sure that you have a light by your bed that is easy to reach.  Do not use any sheets or blankets that are too big for your bed. They should not  hang down onto the floor.  Have a firm chair that has side arms. You can use this for support while you get dressed.  Do not have throw rugs and other things on the floor that can make you trip. What can I do in the kitchen?  Clean up any spills right away.  Avoid walking on wet floors.  Keep items that you use a lot in easy-to-reach places.  If you need to reach something above you, use a strong step stool that has a grab bar.  Keep electrical cords out of the way.  Do not use floor polish or wax that makes floors slippery. If you must use wax, use non-skid floor wax.  Do not have throw rugs and other things on the floor that can make you trip. What can  I do with my stairs?  Do not leave any items on the stairs.  Make sure that there are handrails on both sides of the stairs and use them. Fix handrails that are broken or loose. Make sure that handrails are as long as the stairways.  Check any carpeting to make sure that it is firmly attached to the stairs. Fix any carpet that is loose or worn.  Avoid having throw rugs at the top or bottom of the stairs. If you do have throw rugs, attach them to the floor with carpet tape.  Make sure that you have a light switch at the top of the stairs and the bottom of the stairs. If you do not have them, ask someone to add them for you. What else can I do to help prevent falls?  Wear shoes that:  Do not have high heels.  Have rubber bottoms.  Are comfortable and fit you well.  Are closed at the toe. Do not wear sandals.  If you use a stepladder:  Make sure that it is fully opened. Do not climb a closed stepladder.  Make sure that both sides of the stepladder are locked into place.  Ask someone to hold it for you, if possible.  Clearly mark and make sure that you can see:  Any grab bars or handrails.  First and last steps.  Where the edge of each step is.  Use tools that help you move around (mobility aids) if they are needed. These include:  Canes.  Walkers.  Scooters.  Crutches.  Turn on the lights when you go into a dark area. Replace any light bulbs as soon as they burn out.  Set up your furniture so you have a clear path. Avoid moving your furniture around.  If any of your floors are uneven, fix them.  If there are any pets around you, be aware of where they are.  Review your medicines with your doctor. Some medicines can make you feel dizzy. This can increase your chance of falling. Ask your doctor what other things that you can do to help prevent falls. This information is not intended to replace advice given to you by your health care provider. Make sure you  discuss any questions you have with your health care provider. Document Released: 01/02/2009 Document Revised: 08/14/2015 Document Reviewed: 04/12/2014 Elsevier Interactive Patient Education  2017 [employer].

## 2020-02-22 ENCOUNTER — Other Ambulatory Visit: Payer: Self-pay | Admitting: Internal Medicine

## 2020-03-10 ENCOUNTER — Encounter: Payer: Self-pay | Admitting: Internal Medicine

## 2020-06-04 ENCOUNTER — Telehealth: Payer: Self-pay | Admitting: Internal Medicine

## 2020-06-04 ENCOUNTER — Ambulatory Visit: Payer: Medicare Other | Admitting: Internal Medicine

## 2020-06-04 NOTE — Telephone Encounter (Signed)
Benefits investigation for Prolia done 2.25.22  Patient out of pocket: $486, no prior auth required  Eligible to schedule after 3.17.22

## 2020-06-12 ENCOUNTER — Encounter: Payer: Self-pay | Admitting: Internal Medicine

## 2020-06-12 ENCOUNTER — Other Ambulatory Visit: Payer: Self-pay

## 2020-06-12 ENCOUNTER — Ambulatory Visit: Payer: Medicare Other | Admitting: Internal Medicine

## 2020-06-12 VITALS — BP 152/82 | HR 82 | Temp 98.9°F | Ht 62.0 in | Wt 103.6 lb

## 2020-06-12 DIAGNOSIS — I2583 Coronary atherosclerosis due to lipid rich plaque: Secondary | ICD-10-CM

## 2020-06-12 DIAGNOSIS — R030 Elevated blood-pressure reading, without diagnosis of hypertension: Secondary | ICD-10-CM | POA: Diagnosis not present

## 2020-06-12 DIAGNOSIS — E785 Hyperlipidemia, unspecified: Secondary | ICD-10-CM

## 2020-06-12 DIAGNOSIS — I2510 Atherosclerotic heart disease of native coronary artery without angina pectoris: Secondary | ICD-10-CM | POA: Diagnosis not present

## 2020-06-12 DIAGNOSIS — M810 Age-related osteoporosis without current pathological fracture: Secondary | ICD-10-CM | POA: Diagnosis not present

## 2020-06-12 LAB — COMPREHENSIVE METABOLIC PANEL
ALT: 19 U/L (ref 0–35)
AST: 23 U/L (ref 0–37)
Albumin: 4.6 g/dL (ref 3.5–5.2)
Alkaline Phosphatase: 50 U/L (ref 39–117)
BUN: 9 mg/dL (ref 6–23)
CO2: 32 mEq/L (ref 19–32)
Calcium: 9.7 mg/dL (ref 8.4–10.5)
Chloride: 102 mEq/L (ref 96–112)
Creatinine, Ser: 0.76 mg/dL (ref 0.40–1.20)
GFR: 74.32 mL/min (ref >60.00)
Glucose, Bld: 102 mg/dL — ABNORMAL HIGH (ref 70–99)
Potassium: 4 mEq/L (ref 3.5–5.1)
Sodium: 143 mEq/L (ref 135–145)
Total Bilirubin: 0.5 mg/dL (ref 0.2–1.2)
Total Protein: 6.9 g/dL (ref 6.0–8.3)

## 2020-06-12 LAB — LIPID PANEL
Cholesterol: 135 mg/dL (ref 0–200)
HDL: 72 mg/dL (ref >39.00)
LDL Cholesterol: 44 mg/dL (ref 0–99)
NonHDL: 63.43
Total CHOL/HDL Ratio: 2
Triglycerides: 95 mg/dL (ref 0.0–149.0)
VLDL: 19 mg/dL (ref 0.0–40.0)

## 2020-06-12 LAB — TSH: TSH: 4.24 u[IU]/mL (ref 0.35–4.50)

## 2020-06-12 MED ORDER — ROSUVASTATIN CALCIUM 5 MG PO TABS: 2.5000 mg | ORAL_TABLET | Freq: Every day | ORAL | 3 refills | Status: DC

## 2020-06-12 NOTE — Assessment & Plan Note (Signed)
  BP nl at home 

## 2020-06-12 NOTE — Assessment & Plan Note (Signed)
Prolia Vit D

## 2020-06-12 NOTE — Addendum Note (Signed)
Addended by: Jacobo Forest on: 06/12/2020 09:28 AM   Modules accepted: Orders

## 2020-06-12 NOTE — Progress Notes (Signed)
Subjective:  Patient ID: Joy Thompson, female    DOB: 02/08/1941  Age: 80 y.o. MRN: [id number]  CC: Follow-up (6 MONTHS F/U)   HPI KATARZYNA WOLVEN presents for osteoporosis, dyslipidemia f/u BP nl at home Outpatient Medications Prior to Visit  Medication Sig Dispense Refill  . aspirin 81 MG EC tablet Take 81 mg by mouth daily.    . calcium-vitamin D (OSCAL WITH D 500-200) 500-200 MG-UNIT per tablet Take 1.5 tablets by mouth.    . Cholecalciferol 1000 UNITS tablet Take 1,000 Units by mouth daily.    Marland Kitchen CRANBERRY PO Take 1 tablet by mouth daily.    Marland Kitchen denosumab (PROLIA) 60 MG/ML SOLN injection Inject 60 mg into the skin every 6 (six) months. Administer in upper arm, thigh, or abdomen    . Multiple Vitamins-Minerals (MULTIVITAMIN,TX-MINERALS) tablet Take 1 tablet by mouth daily.    . rosuvastatin (CRESTOR) 5 MG tablet TAKE ONE-HALF TABLET BY  MOUTH DAILY 45 tablet 2  . 0.9 %  sodium chloride infusion      No facility-administered medications prior to visit.    ROS: Review of Systems  Constitutional: Negative for activity change, appetite change, chills, fatigue and unexpected weight change.  HENT: Negative for congestion, mouth sores and sinus pressure.   Eyes: Negative for visual disturbance.  Respiratory: Negative for cough, chest tightness and shortness of breath.   Gastrointestinal: Negative for abdominal pain and nausea.  Genitourinary: Negative for difficulty urinating, frequency and vaginal pain.  Musculoskeletal: Negative for back pain and gait problem.  Skin: Negative for pallor and rash.  Neurological: Negative for dizziness, tremors, weakness, numbness and headaches.  Psychiatric/Behavioral: Negative for confusion and sleep disturbance.    Objective:  BP (!) 152/82 (BP Location: Left Arm)   Pulse 82   Temp 98.9 F (37.2 C) (Oral)   Ht 5\' 2"  (1.575 m)   Wt 103 lb 9.6 oz (47 kg)   SpO2 93%   BMI 18.95 kg/m   BP Readings from Last 3 Encounters:  06/12/20 (!)  152/82  12/06/19 (!) 160/80  10/31/19 124/80    Wt Readings from Last 3 Encounters:  06/12/20 103 lb 9.6 oz (47 kg)  12/06/19 102 lb (46.3 kg)  10/31/19 104 lb (47.2 kg)    Physical Exam Constitutional:      General: She is not in acute distress.    Appearance: She is well-developed.  HENT:     Head: Normocephalic.     Right Ear: External ear normal.     Left Ear: External ear normal.     Nose: Nose normal.  Eyes:     General:        Right eye: No discharge.        Left eye: No discharge.     Conjunctiva/sclera: Conjunctivae normal.     Pupils: Pupils are equal, round, and reactive to light.  Neck:     Thyroid: No thyromegaly.     Vascular: No JVD.     Trachea: No tracheal deviation.  Cardiovascular:     Rate and Rhythm: Normal rate and regular rhythm.     Heart sounds: Normal heart sounds.  Pulmonary:     Effort: No respiratory distress.     Breath sounds: No stridor. No wheezing.  Abdominal:     General: Bowel sounds are normal. There is no distension.     Palpations: Abdomen is soft. There is no mass.     Tenderness: There is no abdominal tenderness. There  is no guarding or rebound.  Musculoskeletal:        General: No tenderness.     Cervical back: Normal range of motion and neck supple.  Lymphadenopathy:     Cervical: No cervical adenopathy.  Skin:    Findings: No erythema or rash.  Neurological:     Cranial Nerves: No cranial nerve deficit.     Motor: No abnormal muscle tone.     Coordination: Coordination normal.     Deep Tendon Reflexes: Reflexes normal.  Psychiatric:        Behavior: Behavior normal.        Thought Content: Thought content normal.        Judgment: Judgment normal.     Lab Results  Component Value Date   WBC 8.1 12/06/2019   HGB 13.8 12/06/2019   HCT 41.8 12/06/2019   PLT 318 12/06/2019   GLUCOSE 87 12/06/2019   CHOL 134 12/06/2019   TRIG 86 12/06/2019   HDL 75 12/06/2019   LDLCALC 42 12/06/2019   ALT 20 12/06/2019   AST  24 12/06/2019   NA 142 12/06/2019   K 4.2 12/06/2019   CL 100 12/06/2019   CREATININE 0.66 12/06/2019   BUN 8 12/06/2019   CO2 26 12/06/2019   TSH 3.86 12/06/2019    CT Chest Wo Contrast  Result Date: 03/19/2019 CLINICAL DATA:  Follow-up lung nodules EXAM: CT CHEST WITHOUT CONTRAST TECHNIQUE: Multidetector CT imaging of the chest was performed following the standard protocol without IV contrast. COMPARISON:  CT coronary calcium score studies 10/06/2018 FINDINGS: Cardiovascular: Heart is normal size. Aorta is normal caliber. Extensive coronary artery and scattered aortic calcifications. Mediastinum/Nodes: No mediastinal, hilar, or axillary adenopathy. Trachea and esophagus are unremarkable. Thyroid unremarkable. Lungs/Pleura: Biapical scarring triangular shaped nodule anteriorly in the right middle lobe measures 6 mm and is stable. Areas of scarring in the right middle lobe and lingula. Clustered nodules posteriorly in the right lower lobe are stable small anterior left upper lobe nodule appears calcified on today's study compatible with granuloma. No new or enlarging pulmonary nodules. No effusions. Upper Abdomen: Imaging into the upper abdomen shows no acute findings. Musculoskeletal: Chest wall soft tissues are unremarkable. No acute bony abnormality. IMPRESSION: Numerous bilateral pulmonary nodules, the largest 6 mm in the anterior right middle lobe. These are unchanged since prior study and most likely post infectious/inflammatory. Continued follow-up could be performed with repeat CT in 12 months. Extensive coronary artery disease Aortic Atherosclerosis (ICD10-I70.0). Electronically Signed   By: Rolm Baptise M.D.   On: 03/19/2019 12:24    Assessment & Plan:   There are no diagnoses linked to this encounter.   No orders of the defined types were placed in this encounter.    Follow-up: No follow-ups on file.  Walker Kehr, MD

## 2020-06-12 NOTE — Assessment & Plan Note (Addendum)
BP nl at home On [facility]rest[facility] Labs - lipids

## 2020-07-02 ENCOUNTER — Ambulatory Visit (INDEPENDENT_AMBULATORY_CARE_PROVIDER_SITE_OTHER): Payer: Medicare Other

## 2020-07-02 ENCOUNTER — Other Ambulatory Visit: Payer: Self-pay

## 2020-07-02 DIAGNOSIS — M810 Age-related osteoporosis without current pathological fracture: Secondary | ICD-10-CM

## 2020-07-02 MED ORDER — DENOSUMAB 60 MG/ML ~~LOC~~ SOSY
60.0000 mg | PREFILLED_SYRINGE | Freq: Once | SUBCUTANEOUS | Status: AC
  Administered 2020-07-02: 60 mg via SUBCUTANEOUS

## 2020-07-02 NOTE — Progress Notes (Addendum)
Pt here for Prolia njection per Dr. Alain Marion  Prolia in left arm, Sub Q and pt tolerated injection well.  Next Prolia injection scheduled for 01/01/21.   Medical screening examination/treatment/procedure(s) were performed by non-physician practitioner and as supervising physician I was immediately available for consultation/collaboration.  I agree with above. Lew Dawes, MD

## 2020-09-15 IMAGING — CT CT CHEST W/O CM
2 of 3 series · 15 of 36 positions shown, 18 images · non-contrast
Comparison: CT coronary calcium score studies 10/06/2018

CLINICAL DATA: Follow-up lung nodules

EXAM:
CT CHEST WITHOUT CONTRAST
TECHNIQUE: Multidetector CT imaging of the chest was performed following the
standard protocol without IV contrast.

[Series 2: thorax · axial · 0.56mm/px · z∈[-308,-50]mm · 12 of 153 slices shown, 15 images]
[im 12/153  mediastinal]
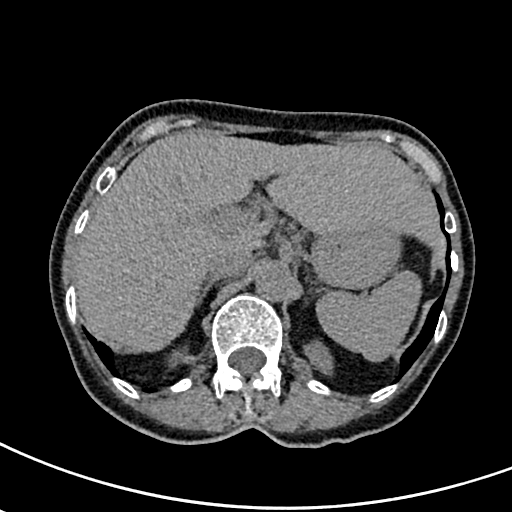
[im 12/153  lung]
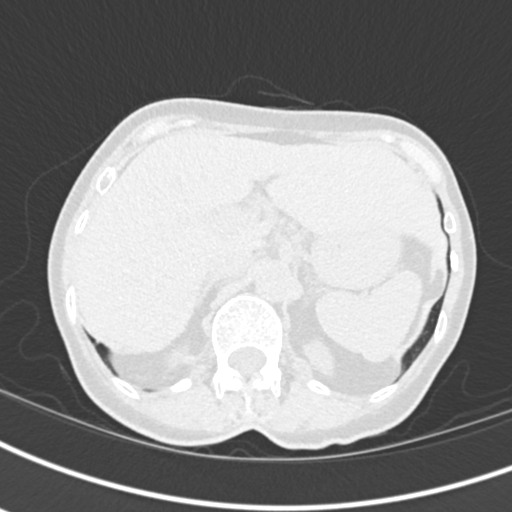
[im 23/153  lung]
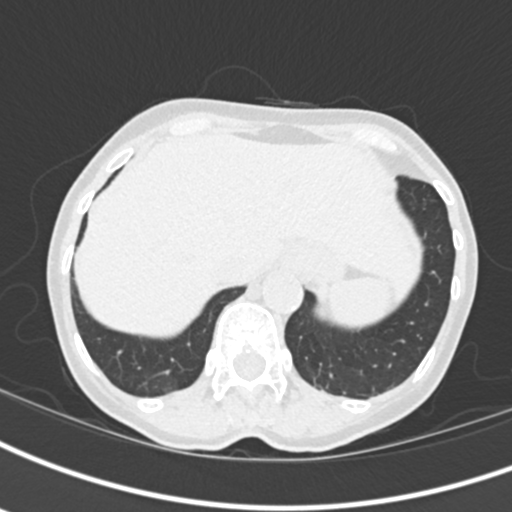
[im 34/153  lung]
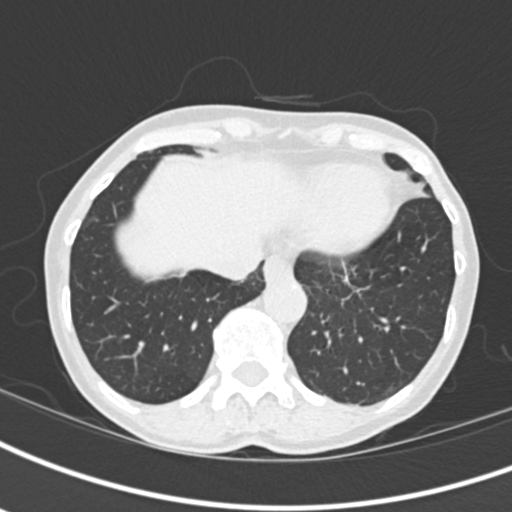
[im 46/153  lung]
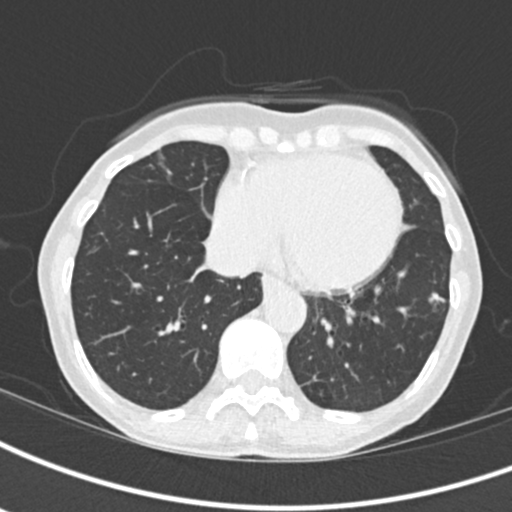
[im 57/153  mediastinal]
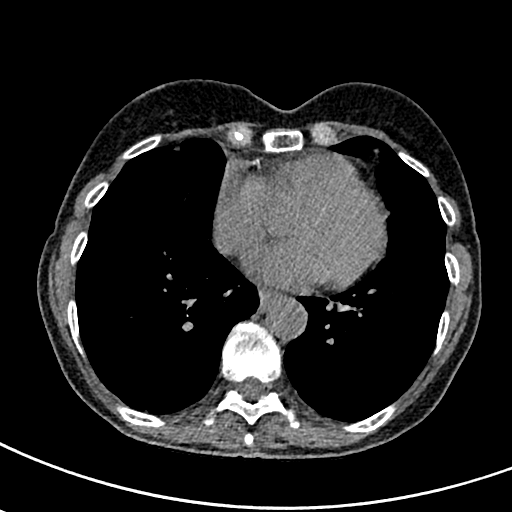
[im 57/153  lung]
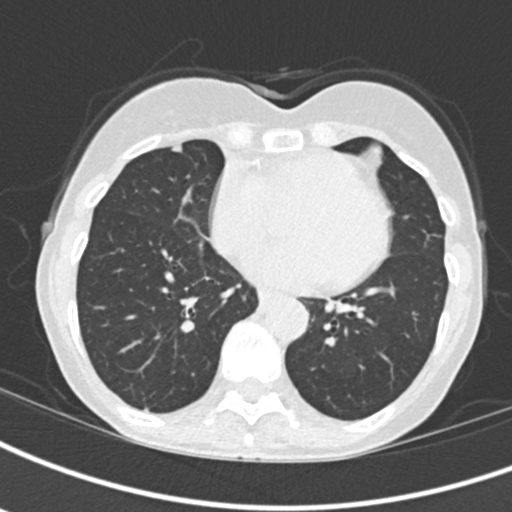
[im 68/153  lung]
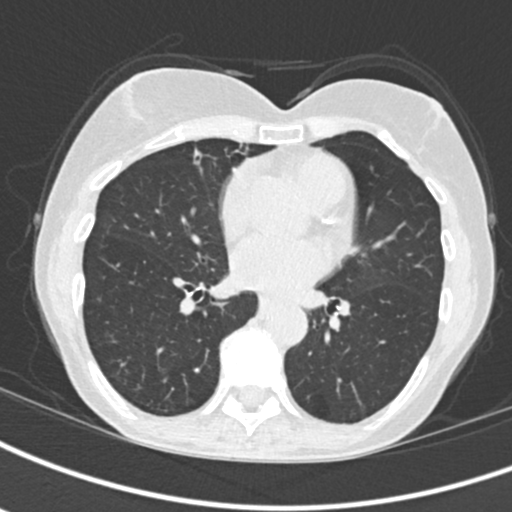
[im 85/153  lung]
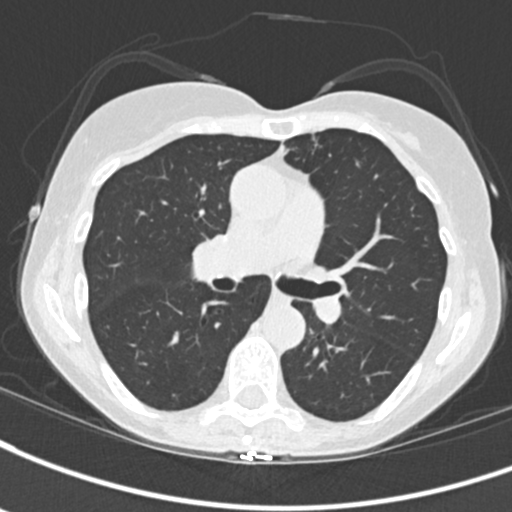
[im 96/153  lung]
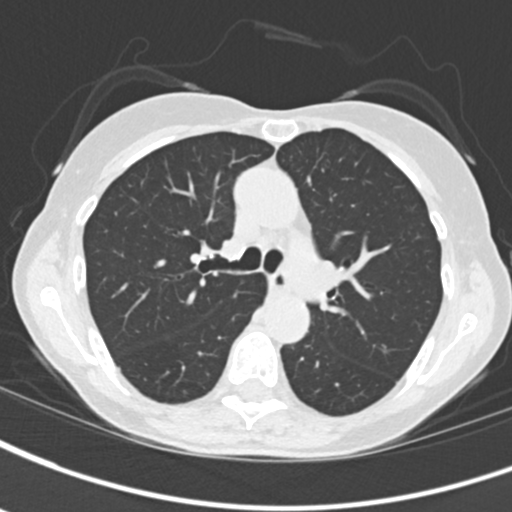
[im 107/153  mediastinal]
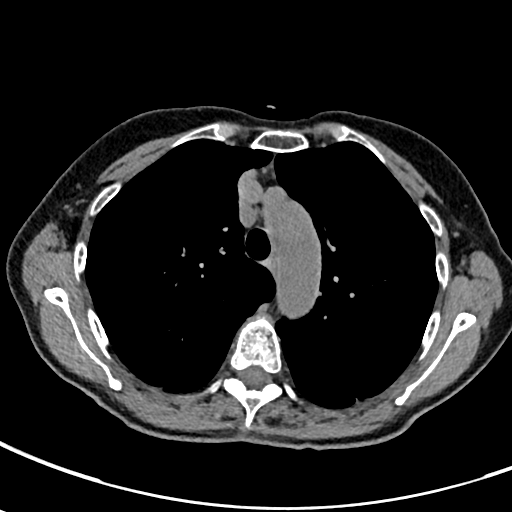
[im 107/153  lung]
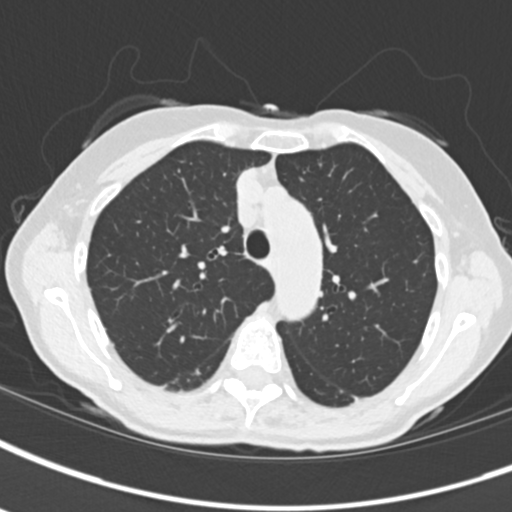
[im 119/153  lung]
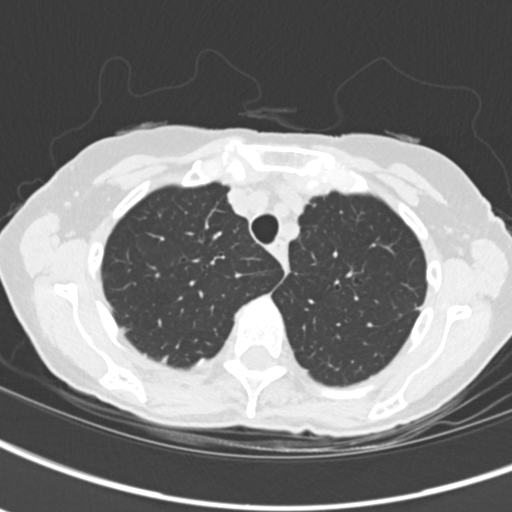
[im 130/153  lung]
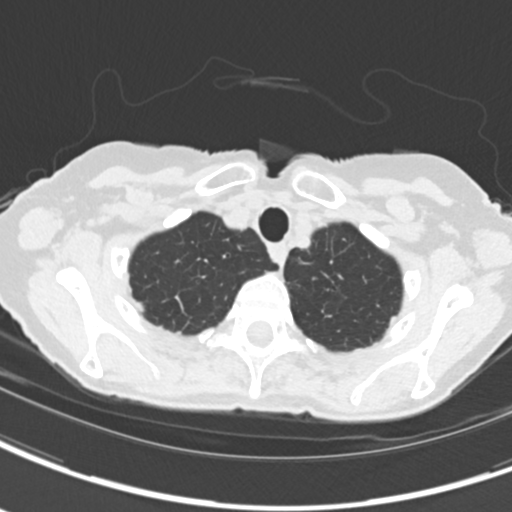
[im 141/153  lung]
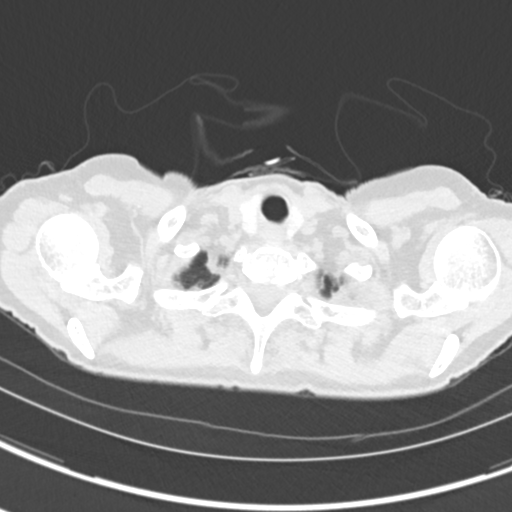

[Series 5: coronal · coronal · 0.51mm/px · 3 of 106 slices shown]
[im 22/106  lung]
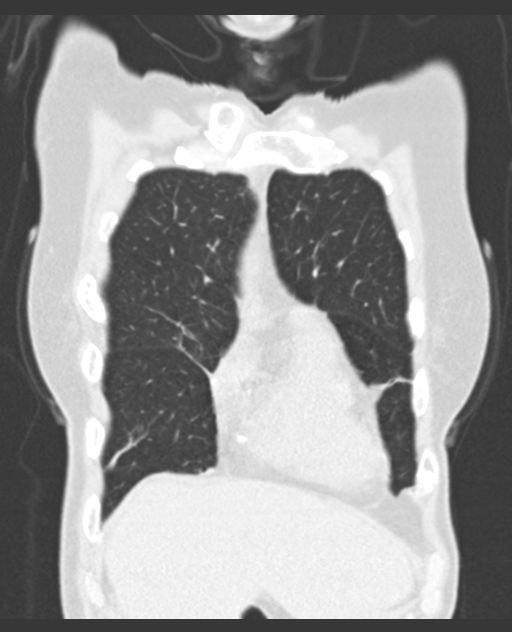
[im 43/106  lung]
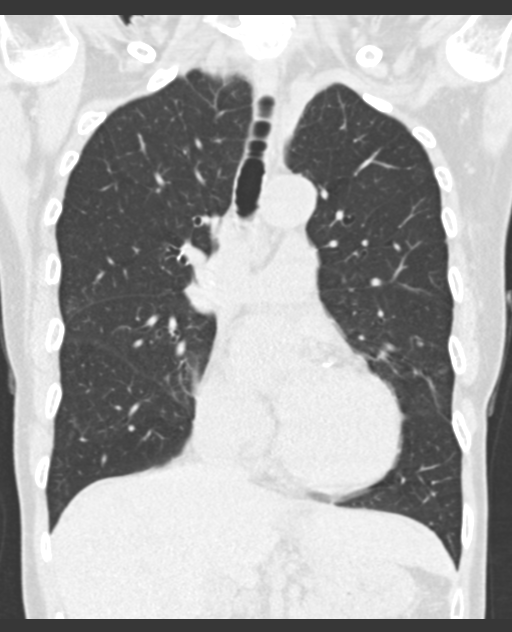
[im 64/106  lung]
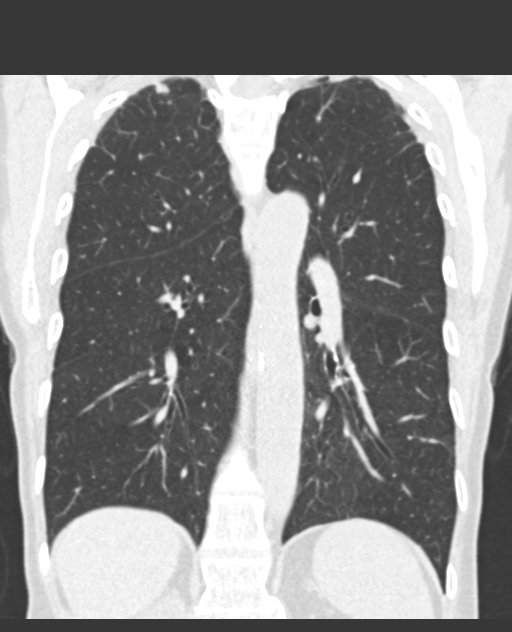

[15 of 36 positions shown; findings below may reference images not displayed]

FINDINGS: Cardiovascular: Heart is normal size. Aorta is normal caliber.
Extensive coronary artery and scattered aortic calcifications.

Mediastinum/Nodes: No mediastinal, hilar, or axillary adenopathy.
Trachea and esophagus are unremarkable. Thyroid unremarkable.

Lungs/Pleura: Biapical scarring triangular shaped nodule anteriorly
in the right middle lobe measures 6 mm and is stable. Areas of
scarring in the right middle lobe and lingula. Clustered nodules
posteriorly in the right lower lobe are stable small anterior left
upper lobe nodule appears calcified on today's study compatible with
granuloma. No new or enlarging pulmonary nodules. No effusions.

Upper Abdomen: Imaging into the upper abdomen shows no acute
findings.

Musculoskeletal: Chest wall soft tissues are unremarkable. No acute
bony abnormality.
IMPRESSION: Numerous bilateral pulmonary nodules, the largest 6 mm in the
anterior right middle lobe. These are unchanged since prior study
and most likely post infectious/inflammatory. Continued follow-up
could be performed with repeat CT in 12 months.

Extensive coronary artery disease

Aortic Atherosclerosis ([id number]).

## 2020-10-06 LAB — HM MAMMOGRAPHY

## 2020-10-09 ENCOUNTER — Encounter: Payer: Self-pay | Admitting: Internal Medicine

## 2020-10-31 ENCOUNTER — Encounter: Payer: Medicare Other | Admitting: Obstetrics and Gynecology

## 2020-11-06 ENCOUNTER — Ambulatory Visit (INDEPENDENT_AMBULATORY_CARE_PROVIDER_SITE_OTHER): Payer: Medicare Other | Admitting: Obstetrics and Gynecology

## 2020-11-06 ENCOUNTER — Encounter: Payer: Self-pay | Admitting: Obstetrics and Gynecology

## 2020-11-06 ENCOUNTER — Other Ambulatory Visit: Payer: Self-pay

## 2020-11-06 VITALS — BP 160/78 | HR 84 | Ht 61.0 in | Wt 104.0 lb

## 2020-11-06 DIAGNOSIS — Z1239 Encounter for other screening for malignant neoplasm of breast: Secondary | ICD-10-CM

## 2020-11-06 DIAGNOSIS — Z008 Encounter for other general examination: Secondary | ICD-10-CM | POA: Diagnosis not present

## 2020-11-06 DIAGNOSIS — Z01419 Encounter for gynecological examination (general) (routine) without abnormal findings: Secondary | ICD-10-CM
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
## 2020-11-06 NOTE — Patient Instructions (Signed)

## 2020-12-08 ENCOUNTER — Other Ambulatory Visit: Payer: Self-pay

## 2020-12-08 ENCOUNTER — Encounter: Payer: Self-pay | Admitting: Internal Medicine

## 2020-12-08 ENCOUNTER — Ambulatory Visit (INDEPENDENT_AMBULATORY_CARE_PROVIDER_SITE_OTHER): Payer: Medicare Other | Admitting: Internal Medicine

## 2020-12-08 VITALS — BP 140/84 | HR 81 | Temp 98.7°F | Ht 61.0 in | Wt 105.6 lb

## 2020-12-08 DIAGNOSIS — I2510 Atherosclerotic heart disease of native coronary artery without angina pectoris: Secondary | ICD-10-CM | POA: Diagnosis not present

## 2020-12-08 DIAGNOSIS — R9389 Abnormal findings on diagnostic imaging of other specified body structures: Secondary | ICD-10-CM | POA: Diagnosis not present

## 2020-12-08 DIAGNOSIS — Z0000 Encounter for general adult medical examination without abnormal findings: Secondary | ICD-10-CM

## 2020-12-08 DIAGNOSIS — R918 Other nonspecific abnormal finding of lung field: Secondary | ICD-10-CM | POA: Diagnosis not present

## 2020-12-08 DIAGNOSIS — R7989 Other specified abnormal findings of blood chemistry: Secondary | ICD-10-CM

## 2020-12-08 DIAGNOSIS — Z23 Encounter for immunization: Secondary | ICD-10-CM

## 2020-12-08 DIAGNOSIS — I2583 Coronary atherosclerosis due to lipid rich plaque: Secondary | ICD-10-CM | POA: Diagnosis not present

## 2020-12-08 LAB — CBC WITH DIFFERENTIAL/PLATELET
Basophils Absolute: 0 10*3/uL (ref 0.0–0.1)
Basophils Relative: 0.9 % (ref 0.0–3.0)
Eosinophils Absolute: 0.1 10*3/uL (ref 0.0–0.7)
Eosinophils Relative: 2.6 % (ref 0.0–5.0)
HCT: 39.9 % (ref 36.0–46.0)
Hemoglobin: 13.2 g/dL (ref 12.0–15.0)
Lymphocytes Relative: 21.3 % (ref 12.0–46.0)
Lymphs Abs: 1.1 10*3/uL (ref 0.7–4.0)
MCHC: 33.1 g/dL (ref 30.0–36.0)
MCV: 89.1 fl (ref 78.0–100.0)
Monocytes Absolute: 0.5 10*3/uL (ref 0.1–1.0)
Monocytes Relative: 9.3 % (ref 3.0–12.0)
Neutro Abs: 3.5 10*3/uL (ref 1.4–7.7)
Neutrophils Relative %: 65.9 % (ref 43.0–77.0)
Platelets: 259 10*3/uL (ref 150.0–400.0)
RBC: 4.48 Mil/uL (ref 3.87–5.11)
RDW: 13.1 % (ref 11.5–15.5)
WBC: 5.3 10*3/uL (ref 4.0–10.5)

## 2020-12-08 LAB — URINALYSIS, ROUTINE W REFLEX MICROSCOPIC
Bilirubin Urine: NEGATIVE
Hgb urine dipstick: NEGATIVE
Ketones, ur: NEGATIVE
Nitrite: POSITIVE — AB
Specific Gravity, Urine: <=1.005 — AB (ref 1.000–1.030)
Total Protein, Urine: NEGATIVE
Urine Glucose: NEGATIVE
Urobilinogen, UA: 0.2 (ref 0.0–1.0)
pH: 6.5 (ref 5.0–8.0)

## 2020-12-08 LAB — COMPREHENSIVE METABOLIC PANEL
ALT: 19 U/L (ref 0–35)
AST: 21 U/L (ref 0–37)
Albumin: 4.2 g/dL (ref 3.5–5.2)
Alkaline Phosphatase: 62 U/L (ref 39–117)
BUN: 9 mg/dL (ref 6–23)
CO2: 31 mEq/L (ref 19–32)
Calcium: 9.5 mg/dL (ref 8.4–10.5)
Chloride: 102 mEq/L (ref 96–112)
Creatinine, Ser: 0.73 mg/dL (ref 0.40–1.20)
GFR: 77.73 mL/min (ref >60.00)
Glucose, Bld: 94 mg/dL (ref 70–99)
Potassium: 4.1 mEq/L (ref 3.5–5.1)
Sodium: 141 mEq/L (ref 135–145)
Total Bilirubin: 0.5 mg/dL (ref 0.2–1.2)
Total Protein: 6.8 g/dL (ref 6.0–8.3)

## 2020-12-08 LAB — LIPID PANEL
Cholesterol: 129 mg/dL (ref 0–200)
HDL: 73.8 mg/dL (ref >39.00)
LDL Cholesterol: 41 mg/dL (ref 0–99)
NonHDL: 55.61
Total CHOL/HDL Ratio: 2
Triglycerides: 74 mg/dL (ref 0.0–149.0)
VLDL: 14.8 mg/dL (ref 0.0–40.0)

## 2020-12-08 LAB — TSH: TSH: 5.93 u[IU]/mL — ABNORMAL HIGH (ref 0.35–5.50)

## 2020-12-08 LAB — T4, FREE: Free T4: 0.76 ng/dL (ref 0.60–1.60)

## 2020-12-08 NOTE — Assessment & Plan Note (Signed)
Will get a chest CT periodically

## 2020-12-08 NOTE — Addendum Note (Signed)
Addended by: Boris Lown B on: 12/08/2020 09:17 AM   Modules accepted: Orders

## 2020-12-08 NOTE — Patient Instructions (Signed)
For a mild COVID-19 case - take zinc 50 mg a day for 1 week, vitamin C 1000 mg daily for 1 week, vitamin D2 50,000 units weekly for 2 months (unless  taking vitamin D daily already), an antioxidant Quercetin 500 mg twice a day for 1 week (if you can get it quick enough). Take Allegra or Benadryl.  Maintain good oral hydration and take Tylenol for high fever.  Call if problems. Isolate for 5 days, then wear a mask for 5 days per CDC.  

## 2020-12-08 NOTE — Assessment & Plan Note (Signed)
2020 CT ca scoring test - Coronary calcium score of 217.  On Crestor, ASA

## 2020-12-08 NOTE — Progress Notes (Signed)
Subjective:  Patient ID: Joy Thompson, female    DOB: 04-07-1940  Age: 80 y.o. MRN: [id number]  CC: Annual Exam   HPI Joy Thompson presents for a well exam  Outpatient Medications Prior to Visit  Medication Sig Dispense Refill   aspirin 81 MG EC tablet Take 81 mg by mouth daily.     calcium-vitamin D (OSCAL WITH D 500-200) 500-200 MG-UNIT per tablet Take 1.5 tablets by mouth.     Cholecalciferol 1000 UNITS tablet Take 1,000 Units by mouth daily.     CRANBERRY PO Take 1 tablet by mouth daily.     denosumab (PROLIA) 60 MG/ML SOLN injection Inject 60 mg into the skin every 6 (six) months. Administer in upper arm, thigh, or abdomen     Multiple Vitamins-Minerals (MULTIVITAMIN,TX-MINERALS) tablet Take 1 tablet by mouth daily.     rosuvastatin (CRESTOR) 5 MG tablet Take 0.5 tablets (2.5 mg total) by mouth daily. 45 tablet 3   No facility-administered medications prior to visit.    ROS: Review of Systems  Constitutional:  Negative for activity change, appetite change, chills, fatigue and unexpected weight change.  HENT:  Negative for congestion, mouth sores and sinus pressure.   Eyes:  Negative for visual disturbance.  Respiratory:  Negative for cough and chest tightness.   Gastrointestinal:  Negative for abdominal pain and nausea.  Genitourinary:  Negative for difficulty urinating, frequency and vaginal pain.  Musculoskeletal:  Negative for back pain and gait problem.  Skin:  Negative for pallor and rash.  Neurological:  Negative for dizziness, tremors, weakness, numbness and headaches.  Psychiatric/Behavioral:  Negative for confusion and sleep disturbance.    Objective:  BP 140/84 (BP Location: Left Arm)   Pulse 81   Temp 98.7 F (37.1 C) (Oral)   Ht '5\' 1"'$  (1.549 m)   Wt 105 lb 9.6 oz (47.9 kg)   SpO2 93%   BMI 19.95 kg/m   BP Readings from Last 3 Encounters:  12/08/20 140/84  11/06/20 (!) 160/78  06/12/20 (!) 152/82    Wt Readings from Last 3 Encounters:   12/08/20 105 lb 9.6 oz (47.9 kg)  11/06/20 104 lb (47.2 kg)  06/12/20 103 lb 9.6 oz (47 kg)    Physical Exam Constitutional:      General: She is not in acute distress.    Appearance: She is well-developed.  HENT:     Head: Normocephalic.     Right Ear: External ear normal.     Left Ear: External ear normal.     Nose: Nose normal.  Eyes:     General:        Right eye: No discharge.        Left eye: No discharge.     Conjunctiva/sclera: Conjunctivae normal.     Pupils: Pupils are equal, round, and reactive to light.  Neck:     Thyroid: No thyromegaly.     Vascular: No JVD.     Trachea: No tracheal deviation.  Cardiovascular:     Rate and Rhythm: Normal rate and regular rhythm.     Heart sounds: Normal heart sounds.  Pulmonary:     Effort: No respiratory distress.     Breath sounds: No stridor. No wheezing.  Abdominal:     General: Bowel sounds are normal. There is no distension.     Palpations: Abdomen is soft. There is no mass.     Tenderness: There is no abdominal tenderness. There is no guarding or rebound.  Musculoskeletal:        General: No tenderness.     Cervical back: Normal range of motion and neck supple. No rigidity.  Lymphadenopathy:     Cervical: No cervical adenopathy.  Skin:    Findings: No erythema or rash.  Neurological:     Cranial Nerves: No cranial nerve deficit.     Motor: No abnormal muscle tone.     Coordination: Coordination normal.     Deep Tendon Reflexes: Reflexes normal.  Psychiatric:        Behavior: Behavior normal.        Thought Content: Thought content normal.        Judgment: Judgment normal.    Lab Results  Component Value Date   WBC 8.1 12/06/2019   HGB 13.8 12/06/2019   HCT 41.8 12/06/2019   PLT 318 12/06/2019   GLUCOSE 102 (H) 06/12/2020   CHOL 135 06/12/2020   TRIG 95.0 06/12/2020   HDL 72.00 06/12/2020   LDLCALC 44 06/12/2020   ALT 19 06/12/2020   AST 23 06/12/2020   NA 143 06/12/2020   K 4.0 06/12/2020   CL  102 06/12/2020   CREATININE 0.76 06/12/2020   BUN 9 06/12/2020   CO2 32 06/12/2020   TSH 4.24 06/12/2020    CT Chest Wo Contrast  Result Date: 03/19/2019 CLINICAL DATA:  Follow-up lung nodules EXAM: CT CHEST WITHOUT CONTRAST TECHNIQUE: Multidetector CT imaging of the chest was performed following the standard protocol without IV contrast. COMPARISON:  CT coronary calcium score studies 10/06/2018 FINDINGS: Cardiovascular: Heart is normal size. Aorta is normal caliber. Extensive coronary artery and scattered aortic calcifications. Mediastinum/Nodes: No mediastinal, hilar, or axillary adenopathy. Trachea and esophagus are unremarkable. Thyroid unremarkable. Lungs/Pleura: Biapical scarring triangular shaped nodule anteriorly in the right middle lobe measures 6 mm and is stable. Areas of scarring in the right middle lobe and lingula. Clustered nodules posteriorly in the right lower lobe are stable small anterior left upper lobe nodule appears calcified on today's study compatible with granuloma. No new or enlarging pulmonary nodules. No effusions. Upper Abdomen: Imaging into the upper abdomen shows no acute findings. Musculoskeletal: Chest wall soft tissues are unremarkable. No acute bony abnormality. IMPRESSION: Numerous bilateral pulmonary nodules, the largest 6 mm in the anterior right middle lobe. These are unchanged since prior study and most likely post infectious/inflammatory. Continued follow-up could be performed with repeat CT in 12 months. Extensive coronary artery disease Aortic Atherosclerosis (ICD10-I70.0). Electronically Signed   By: Rolm Baptise M.D.   On: 03/19/2019 12:24    Assessment & Plan:   Problem List Items Addressed This Visit   None Visit Diagnoses     Needs flu shot    -  Primary   Relevant Orders   Flu Vaccine QUAD High Dose(Fluad) (Completed)          Walker Kehr, MD

## 2020-12-08 NOTE — Assessment & Plan Note (Addendum)
We discussed age appropriate health related issues, including available/recomended screening tests and vaccinations. Labs were ordered to be later reviewed . All questions were answered. We discussed one or more of the following - seat belt use, use of sunscreen/sun exposure exercise, fall risk reduction, second hand smoke exposure, firearm use and storage, seat belt use, a need for adhering to healthy diet and exercise. Labs were ordered.  All questions were answered. 2020 CT ca scoring test - Coronary calcium score of 217.  Balance exercises

## 2020-12-12 ENCOUNTER — Other Ambulatory Visit: Payer: Self-pay

## 2020-12-12 ENCOUNTER — Ambulatory Visit (INDEPENDENT_AMBULATORY_CARE_PROVIDER_SITE_OTHER)
Admission: RE | Admit: 2020-12-12 | Discharge: 2020-12-12 | Disposition: A | Discharge status: Home / Self Care | Payer: Medicare Other | Source: Ambulatory Visit | Attending: Internal Medicine | Admitting: Internal Medicine

## 2020-12-12 DIAGNOSIS — R918 Other nonspecific abnormal finding of lung field: Secondary | ICD-10-CM

## 2020-12-20 ENCOUNTER — Telehealth: Payer: Self-pay

## 2020-12-20 NOTE — Telephone Encounter (Signed)
Prolia VOB initiated via [email or web address]  Last OV:  Next OV:  Last Prolia inj: 07/02/20 Next Prolia inj DUE: 01/02/21

## 2020-12-24 NOTE — Telephone Encounter (Signed)
Prior Authorization required for Prolia.    PA PROCESS DETAILS: Please complete the prior authorization form located at [email or web address] or call [phone].

## 2020-12-29 NOTE — Telephone Encounter (Signed)
Pt ready for scheduling on or after 01/02/21  Out-of-pocket cost due at time of visit: $280  Primary: [facility] Medicare Prolia co-insurance: 20% (approximately $255) Admin fee co-insurance: 20% (approximately $25)  Secondary: n/a Prolia co-insurance:  Admin fee co-insurance:   Deductible: $290 of $290 met  Prior Auth: APPROVED PA# [id number] Valid: 12/29/20-12/29/21  ** This summary of benefits is an estimation of the patient's out-of-pocket cost. Exact cost may vary based on individual plan coverage.

## 2020-12-29 NOTE — Telephone Encounter (Signed)
PA Approved PA# [id number]

## 2021-01-01 ENCOUNTER — Ambulatory Visit (INDEPENDENT_AMBULATORY_CARE_PROVIDER_SITE_OTHER): Payer: Medicare Other

## 2021-01-01 ENCOUNTER — Other Ambulatory Visit: Payer: Self-pay

## 2021-01-01 DIAGNOSIS — M810 Age-related osteoporosis without current pathological fracture: Secondary | ICD-10-CM | POA: Diagnosis not present

## 2021-01-01 MED ORDER — DENOSUMAB 60 MG/ML ~~LOC~~ SOSY
60.0000 mg | PREFILLED_SYRINGE | Freq: Once | SUBCUTANEOUS | Status: AC
  Administered 2021-01-01: 60 mg via SUBCUTANEOUS

## 2021-01-01 NOTE — Progress Notes (Addendum)
Prolia injection given w/o any complications.  Medical screening examination/treatment/procedure(s) were performed by non-physician practitioner and as supervising physician I was immediately available for consultation/collaboration.  I agree with above. Lew Dawes, MD

## 2021-01-09 NOTE — Telephone Encounter (Signed)
Pt received Prolia inj 01/01/21 Next Prolia inj due 07/03/21

## 2021-02-09 ENCOUNTER — Telehealth: Payer: Self-pay | Admitting: Internal Medicine

## 2021-02-09 NOTE — Telephone Encounter (Signed)
N/A unable to leave a message for patient to call me back at [phone] to schedule Medicare Annual Wellness Visit   Last AWV  12/25/20  Please schedule at anytime with [facility] if patient calls the office back.    40 Minutes appointment   Any questions, please call me at [phone]

## 2021-06-03 NOTE — Telephone Encounter (Signed)
Prolia VOB initiated via [email or web address] ? ?Last OV:  ?Next OV:  ?Last Prolia inj: 01/01/21 ?Next Prolia inj DUE: 07/03/21 ? ?

## 2021-06-08 ENCOUNTER — Ambulatory Visit: Payer: Medicare Other | Admitting: Internal Medicine

## 2021-06-08 ENCOUNTER — Encounter: Payer: Self-pay | Admitting: Internal Medicine

## 2021-06-08 ENCOUNTER — Other Ambulatory Visit: Payer: Self-pay

## 2021-06-08 DIAGNOSIS — I2510 Atherosclerotic heart disease of native coronary artery without angina pectoris: Secondary | ICD-10-CM

## 2021-06-08 DIAGNOSIS — R590 Localized enlarged lymph nodes: Secondary | ICD-10-CM

## 2021-06-08 DIAGNOSIS — M810 Age-related osteoporosis without current pathological fracture: Secondary | ICD-10-CM | POA: Diagnosis not present

## 2021-06-08 DIAGNOSIS — R9389 Abnormal findings on diagnostic imaging of other specified body structures: Secondary | ICD-10-CM | POA: Diagnosis not present

## 2021-06-08 DIAGNOSIS — I2583 Coronary atherosclerosis due to lipid rich plaque: Secondary | ICD-10-CM | POA: Diagnosis not present

## 2021-06-08 LAB — CBC WITH DIFFERENTIAL/PLATELET
Basophils Absolute: 0.1 10*3/uL (ref 0.0–0.1)
Basophils Relative: 1.1 % (ref 0.0–3.0)
Eosinophils Absolute: 0.2 10*3/uL (ref 0.0–0.7)
Eosinophils Relative: 4.1 % (ref 0.0–5.0)
HCT: 39.2 % (ref 36.0–46.0)
Hemoglobin: 13 g/dL (ref 12.0–15.0)
Lymphocytes Relative: 25.1 % (ref 12.0–46.0)
Lymphs Abs: 1.4 10*3/uL (ref 0.7–4.0)
MCHC: 33.3 g/dL (ref 30.0–36.0)
MCV: 88.9 fl (ref 78.0–100.0)
Monocytes Absolute: 0.6 10*3/uL (ref 0.1–1.0)
Monocytes Relative: 11 % (ref 3.0–12.0)
Neutro Abs: 3.2 10*3/uL (ref 1.4–7.7)
Neutrophils Relative %: 58.7 % (ref 43.0–77.0)
Platelets: 282 10*3/uL (ref 150.0–400.0)
RBC: 4.41 Mil/uL (ref 3.87–5.11)
RDW: 12.9 % (ref 11.5–15.5)
WBC: 5.4 10*3/uL (ref 4.0–10.5)

## 2021-06-08 LAB — T4, FREE: Free T4: 0.88 ng/dL (ref 0.60–1.60)

## 2021-06-08 LAB — TSH: TSH: 4.04 u[IU]/mL (ref 0.35–5.50)

## 2021-06-08 NOTE — Progress Notes (Signed)
? ?Subjective:  ?Patient ID: Joy Thompson, female    DOB: Jul 31, 1940  Age: 81 y.o. MRN: [id number] ? ?CC: No chief complaint on file. ? ? ?HPI ?Joy Thompson presents for abn CT, dyslipidemia, osteoporosis f/u ? ?Outpatient Medications Prior to Visit  ?Medication Sig Dispense Refill  ? aspirin 81 MG EC tablet Take 81 mg by mouth daily.    ? calcium-vitamin D (OSCAL WITH D 500-200) 500-200 MG-UNIT per tablet Take 1.5 tablets by mouth.    ? Cholecalciferol 1000 UNITS tablet Take 1,000 Units by mouth daily.    ? CRANBERRY PO Take 1 tablet by mouth daily.    ? denosumab (PROLIA) 60 MG/ML SOLN injection Inject 60 mg into the skin every 6 (six) months. Administer in upper arm, thigh, or abdomen    ? Multiple Vitamins-Minerals (MULTIVITAMIN,TX-MINERALS) tablet Take 1 tablet by mouth daily.    ? rosuvastatin (CRESTOR) 5 MG tablet Take 0.5 tablets (2.5 mg total) by mouth daily. 45 tablet 3  ? ?No facility-administered medications prior to visit.  ? ? ?ROS: ?Review of Systems  ?Constitutional:  Negative for activity change, appetite change, chills, fatigue and unexpected weight change.  ?HENT:  Negative for congestion, mouth sores, sinus pressure and sore throat.   ?Eyes:  Negative for visual disturbance.  ?Respiratory:  Negative for cough and chest tightness.   ?Gastrointestinal:  Negative for abdominal pain and nausea.  ?Genitourinary:  Negative for difficulty urinating, frequency and vaginal pain.  ?Musculoskeletal:  Negative for back pain and gait problem.  ?Skin:  Negative for pallor and rash.  ?Neurological:  Negative for dizziness, tremors, weakness, numbness and headaches.  ?Psychiatric/Behavioral:  Negative for confusion and sleep disturbance. The patient is nervous/anxious.   ? ?Objective:  ?BP 130/88 (BP Location: Left Arm, Patient Position: Sitting, Cuff Size: Large)   Pulse 90   Temp 98.6 ?F (37 ?C) (Oral)   Ht '5\' 1"'$  (1.549 m)   Wt 106 lb (48.1 kg)   SpO2 92%   BMI 20.03 kg/m?  ? ?BP Readings from Last  3 Encounters:  ?06/08/21 130/88  ?12/08/20 140/84  ?11/06/20 (!) 160/78  ? ? ?Wt Readings from Last 3 Encounters:  ?06/08/21 106 lb (48.1 kg)  ?12/08/20 105 lb 9.6 oz (47.9 kg)  ?11/06/20 104 lb (47.2 kg)  ? ? ?Physical Exam ?Constitutional:   ?   General: She is not in acute distress. ?   Appearance: Normal appearance. She is well-developed.  ?HENT:  ?   Head: Normocephalic.  ?   Right Ear: External ear normal.  ?   Left Ear: External ear normal.  ?   Nose: Nose normal.  ?Eyes:  ?   General:     ?   Right eye: No discharge.     ?   Left eye: No discharge.  ?   Conjunctiva/sclera: Conjunctivae normal.  ?   Pupils: Pupils are equal, round, and reactive to light.  ?Neck:  ?   Thyroid: No thyromegaly.  ?   Vascular: No JVD.  ?   Trachea: No tracheal deviation.  ?Cardiovascular:  ?   Rate and Rhythm: Normal rate and regular rhythm.  ?   Heart sounds: Normal heart sounds.  ?Pulmonary:  ?   Effort: No respiratory distress.  ?   Breath sounds: No stridor. No wheezing.  ?Abdominal:  ?   General: Bowel sounds are normal. There is no distension.  ?   Palpations: Abdomen is soft. There is no mass.  ?  Tenderness: There is no abdominal tenderness. There is no guarding or rebound.  ?Musculoskeletal:     ?   General: No tenderness.  ?   Cervical back: Normal range of motion and neck supple. No rigidity.  ?Lymphadenopathy:  ?   Cervical: No cervical adenopathy.  ?Skin: ?   Findings: No erythema or rash.  ?Neurological:  ?   Cranial Nerves: No cranial nerve deficit.  ?   Motor: No abnormal muscle tone.  ?   Coordination: Coordination normal.  ?   Deep Tendon Reflexes: Reflexes normal.  ?Psychiatric:     ?   Behavior: Behavior normal.     ?   Thought Content: Thought content normal.     ?   Judgment: Judgment normal.  ?Thin ?Cervical LNs - easy to palpate ? ?Lab Results  ?Component Value Date  ? WBC 5.3 12/08/2020  ? HGB 13.2 12/08/2020  ? HCT 39.9 12/08/2020  ? PLT 259.0 12/08/2020  ? GLUCOSE 94 12/08/2020  ? CHOL 129 12/08/2020   ? TRIG 74.0 12/08/2020  ? HDL 73.80 12/08/2020  ? [place] 41 12/08/2020  ? ALT 19 12/08/2020  ? AST 21 12/08/2020  ? NA 141 12/08/2020  ? K 4.1 12/08/2020  ? CL 102 12/08/2020  ? CREATININE 0.73 12/08/2020  ? BUN 9 12/08/2020  ? CO2 31 12/08/2020  ? TSH 5.93 (H) 12/08/2020  ? ? ?CT Chest Wo Contrast ? ?Result Date: 12/13/2020 ?CLINICAL DATA:  Follow-up pulmonary nodules.  Abnormal x-ray. EXAM: CT CHEST WITHOUT CONTRAST TECHNIQUE: Multidetector CT imaging of the chest was performed following the standard protocol without IV contrast. COMPARISON:  Chest CT 03/19/2019.  No recent chest x-ray. FINDINGS: Cardiovascular: Coronary artery and aortic calcifications. No evidence of aneurysm. Heart is normal size. Aorta is normal caliber. Mediastinum/Nodes: No mediastinal, hilar, or axillary adenopathy. Trachea and esophagus are unremarkable. Thyroid unremarkable. Lungs/Pleura: Areas of scarring in the apices and bases. No confluent airspace opacities, effusions or suspicious pulmonary nodules. Triangular nodule anteriorly in the right middle lobe measures 4 mm and is stable. Clustered nodules posteriorly in the right lower lobe again noted, stable. Upper Abdomen: Imaging into the upper abdomen demonstrates no acute findings. Musculoskeletal: Chest wall soft tissues are unremarkable. No acute bony abnormality. IMPRESSION: Right middle lobe and clustered posterior right lower lobe pulmonary nodules are again noted and are stable since prior study. These are compatible with benign pulmonary nodules. Areas of scarring bilaterally. No new or enlarging pulmonary nodules. Coronary artery disease. Aortic Atherosclerosis (ICD10-I70.0). Electronically Signed   By: Rolm Baptise M.D.   On: 12/13/2020 09:00  ? ? ?Assessment & Plan:  ? ?Problem List Items Addressed This Visit   ? ? Abnormal CXR  ?  No change in 1 year. Discussed ?We can repeat CT in Sept 2023 ?  ?  ? Cervical adenopathy  ?  WNL - the pt is thin ?Check CBC ?  ?  ? Coronary  atherosclerosis  ?  Cont on Crestor, ASA ?  ?  ? Osteoporosis  ?  Cont on Prolia ?Vit D ?  ?  ?  ? ? ?No orders of the defined types were placed in this encounter. ?  ? ? ?Follow-up: Return in about 6 months (around 12/09/2021) for Wellness Exam. ? ?Walker Kehr, MD ?

## 2021-06-08 NOTE — Assessment & Plan Note (Signed)
Cont on Crestor, ASA ?

## 2021-06-08 NOTE — Assessment & Plan Note (Signed)
No change in 1 year. Discussed ?We can repeat CT in Sept 2023 ?

## 2021-06-08 NOTE — Assessment & Plan Note (Signed)
WNL - the pt is thin ?Check CBC ?

## 2021-06-08 NOTE — Assessment & Plan Note (Signed)
Cont on Prolia ?Vit D ?

## 2021-06-09 NOTE — Telephone Encounter (Signed)
Prior auth required for PROLIA  PA PROCESS DETAILS: Effective 03/22/2021 if the patient is new to Prolia, Prior authorization and Step Therapy are required & not on file. Please go to [email or web address] or call [phone] to initiate the prior authorization. For exception to the policy please visit [email or web address] and review Policy Number [id number] 

## 2021-06-11 NOTE — Telephone Encounter (Signed)
Appt 07/02/21 ?

## 2021-06-11 NOTE — Telephone Encounter (Signed)
Pt ready for scheduling on or after 07/03/21 ? ?Out-of-pocket cost due at time of visit: $301 ? ?Primary: [employer] Medicare ?Prolia co-insurance: 20% (approximately $276) ?Admin fee co-insurance: 20% (approximately $25) ? ?Secondary: n/a ?Prolia co-insurance:  ?Admin fee co-insurance:  ? ?Deductible: does not apply ? ?Prior Auth: APPROVED ?PA# [id number] ?Valid: 12/29/20-12/29/21 ? ?** This summary of benefits is an estimation of the patient's out-of-pocket cost. Exact cost may vary based on individual plan coverage.  ? ?

## 2021-07-02 ENCOUNTER — Other Ambulatory Visit: Payer: Self-pay | Admitting: Internal Medicine

## 2021-07-02 ENCOUNTER — Ambulatory Visit (INDEPENDENT_AMBULATORY_CARE_PROVIDER_SITE_OTHER): Payer: Medicare Other

## 2021-07-02 DIAGNOSIS — M810 Age-related osteoporosis without current pathological fracture: Secondary | ICD-10-CM | POA: Diagnosis not present

## 2021-07-02 MED ORDER — DENOSUMAB 60 MG/ML ~~LOC~~ SOSY
60.0000 mg | PREFILLED_SYRINGE | Freq: Once | SUBCUTANEOUS | Status: AC
  Administered 2021-07-02: 60 mg via SUBCUTANEOUS

## 2021-07-02 NOTE — Progress Notes (Addendum)
Pt came into the office for her prolia injection. She tolerated the injection well. No questions or concerns.  ? ?Medical screening examination/treatment/procedure(s) were performed by non-physician practitioner and as supervising physician I was immediately available for consultation/collaboration.  I agree with above. Lew Dawes, MD ? ?

## 2021-07-07 NOTE — Telephone Encounter (Signed)
Last Prolia inj 07/02/21 ?Next Prolia inj due 01/02/22 ?

## 2021-07-08 NOTE — Telephone Encounter (Deleted)
Prior auth required for PROLIA  PA PROCESS DETAILS: Effective 03/22/2021 if the patient is new to Prolia, Prior authorization and Step Therapy are required & not on file. Please go to [email or web address] or call [phone] to initiate the prior authorization. For exception to the policy please visit [email or web address] and review Policy Number [id number] 

## 2021-10-12 LAB — HM MAMMOGRAPHY

## 2021-10-14 ENCOUNTER — Encounter: Payer: Self-pay | Admitting: Internal Medicine

## 2021-11-28 NOTE — Telephone Encounter (Signed)
Prolia VOB initiated via [email or web address] 

## 2021-11-30 ENCOUNTER — Other Ambulatory Visit: Payer: Self-pay | Admitting: Internal Medicine

## 2021-12-09 ENCOUNTER — Encounter: Payer: Self-pay | Admitting: Internal Medicine

## 2021-12-09 ENCOUNTER — Ambulatory Visit: Payer: Medicare Other | Admitting: Internal Medicine

## 2021-12-09 VITALS — BP 142/84 | HR 87 | Temp 98.0°F | Ht 61.0 in | Wt 104.6 lb

## 2021-12-09 DIAGNOSIS — Z23 Encounter for immunization: Secondary | ICD-10-CM

## 2021-12-09 DIAGNOSIS — E785 Hyperlipidemia, unspecified: Secondary | ICD-10-CM | POA: Diagnosis not present

## 2021-12-09 DIAGNOSIS — M810 Age-related osteoporosis without current pathological fracture: Secondary | ICD-10-CM

## 2021-12-09 DIAGNOSIS — I2583 Coronary atherosclerosis due to lipid rich plaque: Secondary | ICD-10-CM | POA: Diagnosis not present

## 2021-12-09 DIAGNOSIS — I2510 Atherosclerotic heart disease of native coronary artery without angina pectoris: Secondary | ICD-10-CM | POA: Diagnosis not present

## 2021-12-09 LAB — COMPREHENSIVE METABOLIC PANEL
ALT: 17 U/L (ref 0–35)
AST: 22 U/L (ref 0–37)
Albumin: 4.1 g/dL (ref 3.5–5.2)
Alkaline Phosphatase: 68 U/L (ref 39–117)
BUN: 11 mg/dL (ref 6–23)
CO2: 29 mEq/L (ref 19–32)
Calcium: 10.1 mg/dL (ref 8.4–10.5)
Chloride: 101 mEq/L (ref 96–112)
Creatinine, Ser: 0.77 mg/dL (ref 0.40–1.20)
GFR: 72.4 mL/min (ref >60.00)
Glucose, Bld: 92 mg/dL (ref 70–99)
Potassium: 4 mEq/L (ref 3.5–5.1)
Sodium: 142 mEq/L (ref 135–145)
Total Bilirubin: 0.4 mg/dL (ref 0.2–1.2)
Total Protein: 6.9 g/dL (ref 6.0–8.3)

## 2021-12-09 LAB — LIPID PANEL
Cholesterol: 128 mg/dL (ref 0–200)
HDL: 68.5 mg/dL (ref >39.00)
LDL Cholesterol: 45 mg/dL (ref 0–99)
NonHDL: 59.57
Total CHOL/HDL Ratio: 2
Triglycerides: 75 mg/dL (ref 0.0–149.0)
VLDL: 15 mg/dL (ref 0.0–40.0)

## 2021-12-09 LAB — TSH: TSH: 4.68 u[IU]/mL (ref 0.35–5.50)

## 2021-12-09 MED ORDER — ROSUVASTATIN CALCIUM 5 MG PO TABS: 2.5000 mg | ORAL_TABLET | Freq: Every day | ORAL | 3 refills | Status: DC

## 2021-12-09 NOTE — Progress Notes (Addendum)
Subjective:  Patient ID: Joy Thompson, female    DOB: [birth year]  Age: 81 y.o. MRN: [id number]  CC: Follow-up (6 month f/u- Flu shot)   HPI Joy Thompson presents for osteoporosis, dyslipidemia, CAD  Outpatient Medications Prior to Visit  Medication Sig Dispense Refill   aspirin 81 MG EC tablet Take 81 mg by mouth daily.     calcium-vitamin D (OSCAL WITH D 500-200) 500-200 MG-UNIT per tablet Take 1.5 tablets by mouth.     Cholecalciferol 1000 UNITS tablet Take 1,000 Units by mouth daily.     CRANBERRY PO Take 1 tablet by mouth daily.     denosumab (PROLIA) 60 MG/ML SOLN injection Inject 60 mg into the skin every 6 (six) months. Administer in upper arm, thigh, or abdomen     Multiple Vitamins-Minerals (MULTIVITAMIN,TX-MINERALS) tablet Take 1 tablet by mouth daily.     rosuvastatin (CRESTOR) 5 MG tablet TAKE ONE-HALF TABLET BY  MOUTH DAILY 45 tablet 1   No facility-administered medications prior to visit.    ROS: Review of Systems  Constitutional:  Negative for fatigue.  Cardiovascular:  Negative for chest pain.    Objective:  BP (!) 142/84 (BP Location: Left Arm)   Pulse 87   Temp 98 F (36.7 C) (Oral)   Ht '5\' 1"'$  (1.549 m)   Wt 104 lb 9.6 oz (47.4 kg)   SpO2 95%   BMI 19.76 kg/m   BP Readings from Last 3 Encounters:  12/09/21 (!) 142/84  06/08/21 130/88  12/08/20 140/84    Wt Readings from Last 3 Encounters:  12/09/21 104 lb 9.6 oz (47.4 kg)  06/08/21 106 lb (48.1 kg)  12/08/20 105 lb 9.6 oz (47.9 kg)    Physical Exam Constitutional:      General: She is not in acute distress.    Appearance: She is well-developed.  HENT:     Head: Normocephalic.     Right Ear: External ear normal.     Left Ear: External ear normal.     Nose: Nose normal.  Eyes:     General:        Right eye: No discharge.        Left eye: No discharge.     Conjunctiva/sclera: Conjunctivae normal.     Pupils: Pupils are equal, round, and reactive to light.  Neck:     Thyroid: No  thyromegaly.     Vascular: No JVD.     Trachea: No tracheal deviation.  Cardiovascular:     Rate and Rhythm: Normal rate and regular rhythm.     Heart sounds: Normal heart sounds.  Pulmonary:     Effort: No respiratory distress.     Breath sounds: No stridor. No wheezing.  Abdominal:     General: Bowel sounds are normal. There is no distension.     Palpations: Abdomen is soft. There is no mass.     Tenderness: There is no abdominal tenderness. There is no guarding or rebound.  Musculoskeletal:        General: No tenderness.     Cervical back: Normal range of motion and neck supple. No rigidity.  Lymphadenopathy:     Cervical: No cervical adenopathy.  Skin:    Findings: No erythema or rash.  Neurological:     Mental Status: She is oriented to person, place, and time.     Cranial Nerves: No cranial nerve deficit.     Motor: No abnormal muscle tone.     Coordination: Coordination  normal.     Deep Tendon Reflexes: Reflexes normal.  Psychiatric:        Behavior: Behavior normal.        Thought Content: Thought content normal.        Judgment: Judgment normal.     Lab Results  Component Value Date   WBC 5.4 06/08/2021   HGB 13.0 06/08/2021   HCT 39.2 06/08/2021   PLT 282.0 06/08/2021   GLUCOSE 94 12/08/2020   CHOL 129 12/08/2020   TRIG 74.0 12/08/2020   HDL 73.80 12/08/2020   LDLCALC 41 12/08/2020   ALT 19 12/08/2020   AST 21 12/08/2020   NA 141 12/08/2020   K 4.1 12/08/2020   CL 102 12/08/2020   CREATININE 0.73 12/08/2020   BUN 9 12/08/2020   CO2 31 12/08/2020   TSH 4.04 06/08/2021    CT Chest Wo Contrast  Result Date: 12/13/2020 CLINICAL DATA:  Follow-up pulmonary nodules.  Abnormal x-ray. EXAM: CT CHEST WITHOUT CONTRAST TECHNIQUE: Multidetector CT imaging of the chest was performed following the standard protocol without IV contrast. COMPARISON:  Chest CT 03/19/2019.  No recent chest x-ray. FINDINGS: Cardiovascular: Coronary artery and aortic calcifications. No  evidence of aneurysm. Heart is normal size. Aorta is normal caliber. Mediastinum/Nodes: No mediastinal, hilar, or axillary adenopathy. Trachea and esophagus are unremarkable. Thyroid unremarkable. Lungs/Pleura: Areas of scarring in the apices and bases. No confluent airspace opacities, effusions or suspicious pulmonary nodules. Triangular nodule anteriorly in the right middle lobe measures 4 mm and is stable. Clustered nodules posteriorly in the right lower lobe again noted, stable. Upper Abdomen: Imaging into the upper abdomen demonstrates no acute findings. Musculoskeletal: Chest wall soft tissues are unremarkable. No acute bony abnormality. IMPRESSION: Right middle lobe and clustered posterior right lower lobe pulmonary nodules are again noted and are stable since prior study. These are compatible with benign pulmonary nodules. Areas of scarring bilaterally. No new or enlarging pulmonary nodules. Coronary artery disease. Aortic Atherosclerosis (ICD10-I70.0). Electronically Signed   By: Rolm Baptise M.D.   On: 12/13/2020 09:00    Assessment & Plan:   Problem List Items Addressed This Visit     Coronary atherosclerosis    Coronary calcium score of 217 - 2020 Cont on Crestor, ASA BP nl at home      Relevant Medications   rosuvastatin (CRESTOR) 5 MG tablet   Dyslipidemia - Primary    On Crestor      Relevant Medications   rosuvastatin (CRESTOR) 5 MG tablet   Other Relevant Orders   TSH   Comprehensive metabolic panel   Lipid panel   Osteoporosis    Cont Prolia Vit D      Other Visit Diagnoses     Needs flu shot       Relevant Orders   Flu Vaccine QUAD High Dose(Fluad) (Completed)         Meds ordered this encounter  Medications   rosuvastatin (CRESTOR) 5 MG tablet    Sig: Take 0.5 tablets (2.5 mg total) by mouth daily.    Dispense:  45 tablet    Refill:  3    Annual appt due in Sept must see provider for future refills      Follow-up: Return in about 6 months (around  06/09/2022) for Wellness Exam.  Walker Kehr, MD

## 2021-12-09 NOTE — Assessment & Plan Note (Signed)
On Crestor 

## 2021-12-09 NOTE — Addendum Note (Signed)
Addended by: Cassandria Anger on: 12/09/2021 09:19 AM   Modules accepted: Level of Service

## 2021-12-09 NOTE — Assessment & Plan Note (Signed)
Cont Prolia Vit D

## 2021-12-09 NOTE — Telephone Encounter (Signed)
Prior auth required for PROLIA (renewal due)  PA PROCESS DETAILS: Effective 03/22/2021 if the patient is new to Prolia, Prior authorization and Step Therapy are required & not on file. Please go to [email or web address] or call [phone] to initiate the prior authorization. For exception to the policy please visit [email or web address] and review Policy Number [email or web address]

## 2021-12-09 NOTE — Assessment & Plan Note (Addendum)
Coronary calcium score of 217 - 2020 Cont on Crestor, ASA BP nl at home

## 2021-12-11 ENCOUNTER — Other Ambulatory Visit: Payer: Self-pay | Admitting: Internal Medicine

## 2022-01-08 ENCOUNTER — Ambulatory Visit (INDEPENDENT_AMBULATORY_CARE_PROVIDER_SITE_OTHER): Payer: Medicare Other | Admitting: *Deleted

## 2022-01-08 DIAGNOSIS — M810 Age-related osteoporosis without current pathological fracture: Secondary | ICD-10-CM | POA: Diagnosis not present

## 2022-01-08 MED ORDER — DENOSUMAB 60 MG/ML ~~LOC~~ SOSY
60.0000 mg | PREFILLED_SYRINGE | Freq: Once | SUBCUTANEOUS | Status: AC
  Administered 2022-01-08: 60 mg via SUBCUTANEOUS

## 2022-01-08 NOTE — Progress Notes (Signed)
Pls cosign for prolia inj in absence of pcp/lmb
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
## 2022-01-13 NOTE — Patient Instructions (Signed)

## 2022-01-18 ENCOUNTER — Ambulatory Visit (INDEPENDENT_AMBULATORY_CARE_PROVIDER_SITE_OTHER): Payer: Medicare Other | Admitting: *Deleted

## 2022-01-18 DIAGNOSIS — Z0000 Encounter for general adult medical examination without abnormal findings: Secondary | ICD-10-CM | POA: Diagnosis not present

## 2022-01-19 ENCOUNTER — Other Ambulatory Visit (HOSPITAL_COMMUNITY): Payer: Self-pay

## 2022-01-19 NOTE — Telephone Encounter (Signed)
Next Prolia inj was due 01/02/22

## 2022-01-19 NOTE — Telephone Encounter (Signed)
Pharmacy Patient Advocate Encounter  Insurance verification completed.    The patient is insured through [employer]   Ran test claims for: Prolia '60mg'$ .  Pharmacy benefit copay: $516.53

## 2022-02-08 NOTE — Telephone Encounter (Signed)
Last Prolia injection 01/08/22 Next Prolia injection due 07/08/22

## 2022-04-20 ENCOUNTER — Other Ambulatory Visit (INDEPENDENT_AMBULATORY_CARE_PROVIDER_SITE_OTHER): Payer: Medicare Other

## 2022-04-20 ENCOUNTER — Ambulatory Visit: Payer: Medicare Other | Admitting: Internal Medicine

## 2022-04-20 ENCOUNTER — Encounter: Payer: Self-pay | Admitting: Internal Medicine

## 2022-04-20 VITALS — BP 160/90 | HR 80 | Temp 97.9°F | Ht 61.0 in | Wt 107.0 lb

## 2022-04-20 DIAGNOSIS — R002 Palpitations: Secondary | ICD-10-CM | POA: Diagnosis not present

## 2022-04-20 DIAGNOSIS — I499 Cardiac arrhythmia, unspecified: Secondary | ICD-10-CM | POA: Diagnosis not present

## 2022-04-20 DIAGNOSIS — R030 Elevated blood-pressure reading, without diagnosis of hypertension: Secondary | ICD-10-CM | POA: Diagnosis not present

## 2022-04-20 LAB — COMPREHENSIVE METABOLIC PANEL
ALT: 19 U/L (ref 0–35)
AST: 22 U/L (ref 0–37)
Albumin: 4.5 g/dL (ref 3.5–5.2)
Alkaline Phosphatase: 66 U/L (ref 39–117)
BUN: 11 mg/dL (ref 6–23)
CO2: 31 mEq/L (ref 19–32)
Calcium: 10.7 mg/dL — ABNORMAL HIGH (ref 8.4–10.5)
Chloride: 95 mEq/L — ABNORMAL LOW (ref 96–112)
Creatinine, Ser: 0.66 mg/dL (ref 0.40–1.20)
GFR: 82.13 mL/min (ref >60.00)
Glucose, Bld: 103 mg/dL — ABNORMAL HIGH (ref 70–99)
Potassium: 3.6 mEq/L (ref 3.5–5.1)
Sodium: 137 mEq/L (ref 135–145)
Total Bilirubin: 0.5 mg/dL (ref 0.2–1.2)
Total Protein: 7.3 g/dL (ref 6.0–8.3)

## 2022-04-20 LAB — MAGNESIUM: Magnesium: 1.9 mg/dL (ref 1.5–2.5)

## 2022-04-20 LAB — T4, FREE: Free T4: 0.98 ng/dL (ref 0.60–1.60)

## 2022-04-20 LAB — TSH: TSH: 4.34 u[IU]/mL (ref 0.35–5.50)

## 2022-04-20 MED ORDER — METOPROLOL SUCCINATE ER 25 MG PO TB24: 12.5000 mg | ORAL_TABLET | Freq: Two times a day (BID) | ORAL | 11 refills | Status: DC

## 2022-04-20 NOTE — Assessment & Plan Note (Signed)
New Start Metoprolol bid RTC in 7-10 d

## 2022-04-20 NOTE — Assessment & Plan Note (Signed)
Worse Start Metoprolol bid RTC in 7-10 d

## 2022-04-20 NOTE — Progress Notes (Signed)
Subjective:  Patient ID: Joy Thompson, female    DOB: 1940/07/09  Age: 82 y.o. MRN: [id number]  CC: Blood Pressure Check (Having heartbeat in her ear mostly at night and having elevated bp)   HPI Joy Thompson presents for c/o having heartbeat in her L ear mostly at night and having elevated BP since Saturday. No HA. No n/v. Not dizzy.Marland KitchenMarland KitchenNo CP. No syncope....  Outpatient Medications Prior to Visit  Medication Sig Dispense Refill   aspirin 81 MG EC tablet Take 81 mg by mouth daily.     calcium-vitamin D (OSCAL WITH D 500-200) 500-200 MG-UNIT per tablet Take 1.5 tablets by mouth.     Cholecalciferol 1000 UNITS tablet Take 1,000 Units by mouth daily.     CRANBERRY PO Take 1 tablet by mouth daily.     denosumab (PROLIA) 60 MG/ML SOLN injection Inject 60 mg into the skin every 6 (six) months. Administer in upper arm, thigh, or abdomen     Multiple Vitamins-Minerals (MULTIVITAMIN,TX-MINERALS) tablet Take 1 tablet by mouth daily.     rosuvastatin (CRESTOR) 5 MG tablet TAKE ONE-HALF TABLET BY MOUTH  DAILY 45 tablet 3   No facility-administered medications prior to visit.    ROS: Review of Systems  Constitutional:  Negative for activity change, appetite change, chills, fatigue and unexpected weight change.  HENT:  Negative for congestion, mouth sores and sinus pressure.   Eyes:  Negative for visual disturbance.  Respiratory:  Negative for cough and chest tightness.   Cardiovascular:  Positive for palpitations.  Gastrointestinal:  Negative for abdominal pain and nausea.  Genitourinary:  Negative for difficulty urinating, frequency and vaginal pain.  Musculoskeletal:  Negative for back pain and gait problem.  Skin:  Negative for pallor and rash.  Neurological:  Negative for dizziness, tremors, weakness, numbness and headaches.  Psychiatric/Behavioral:  Negative for confusion and sleep disturbance. The patient is nervous/anxious.     Objective:  BP (!) 160/90 (BP Location: Right Arm,  Patient Position: Sitting, Cuff Size: Normal)   Pulse 80   Temp 97.9 F (36.6 C) (Oral)   Ht '5\' 1"'$  (1.549 m)   Wt 107 lb (48.5 kg)   SpO2 97%   BMI 20.22 kg/m   BP Readings from Last 3 Encounters:  04/20/22 (!) 160/90  12/09/21 (!) 142/84  06/08/21 130/88    Wt Readings from Last 3 Encounters:  04/20/22 107 lb (48.5 kg)  12/09/21 104 lb 9.6 oz (47.4 kg)  06/08/21 106 lb (48.1 kg)    Physical Exam Constitutional:      General: She is not in acute distress.    Appearance: She is well-developed.  HENT:     Head: Normocephalic.     Right Ear: External ear normal.     Left Ear: External ear normal.     Nose: Nose normal.  Eyes:     General:        Right eye: No discharge.        Left eye: No discharge.     Conjunctiva/sclera: Conjunctivae normal.     Pupils: Pupils are equal, round, and reactive to light.  Neck:     Thyroid: No thyromegaly.     Vascular: No JVD.     Trachea: No tracheal deviation.  Cardiovascular:     Rate and Rhythm: Tachycardia present. Rhythm irregular.     Heart sounds: Normal heart sounds.     No gallop.  Pulmonary:     Effort: No respiratory distress.  Breath sounds: No stridor. No wheezing.  Abdominal:     General: Bowel sounds are normal. There is no distension.     Palpations: Abdomen is soft. There is no mass.     Tenderness: There is no abdominal tenderness. There is no guarding or rebound.  Musculoskeletal:        General: No tenderness.     Cervical back: Normal range of motion and neck supple. No rigidity.  Lymphadenopathy:     Cervical: No cervical adenopathy.  Skin:    Findings: No erythema or rash.  Neurological:     Cranial Nerves: No cranial nerve deficit.     Motor: No abnormal muscle tone.     Coordination: Coordination normal.     Deep Tendon Reflexes: Reflexes normal.  Psychiatric:        Behavior: Behavior normal.        Thought Content: Thought content normal.        Judgment: Judgment normal.   S tachy; occ  extra beats are present  Procedure: EKG Indication: palpitations Impression: S tachycardia   Lab Results  Component Value Date   WBC 5.4 06/08/2021   HGB 13.0 06/08/2021   HCT 39.2 06/08/2021   PLT 282.0 06/08/2021   GLUCOSE 92 12/09/2021   CHOL 128 12/09/2021   TRIG 75.0 12/09/2021   HDL 68.50 12/09/2021   LDLCALC 45 12/09/2021   ALT 17 12/09/2021   AST 22 12/09/2021   NA 142 12/09/2021   K 4.0 12/09/2021   CL 101 12/09/2021   CREATININE 0.77 12/09/2021   BUN 11 12/09/2021   CO2 29 12/09/2021   TSH 4.68 12/09/2021    CT Chest Wo Contrast  Result Date: 12/13/2020 CLINICAL DATA:  Follow-up pulmonary nodules.  Abnormal x-ray. EXAM: CT CHEST WITHOUT CONTRAST TECHNIQUE: Multidetector CT imaging of the chest was performed following the standard protocol without IV contrast. COMPARISON:  Chest CT 03/19/2019.  No recent chest x-ray. FINDINGS: Cardiovascular: Coronary artery and aortic calcifications. No evidence of aneurysm. Heart is normal size. Aorta is normal caliber. Mediastinum/Nodes: No mediastinal, hilar, or axillary adenopathy. Trachea and esophagus are unremarkable. Thyroid unremarkable. Lungs/Pleura: Areas of scarring in the apices and bases. No confluent airspace opacities, effusions or suspicious pulmonary nodules. Triangular nodule anteriorly in the right middle lobe measures 4 mm and is stable. Clustered nodules posteriorly in the right lower lobe again noted, stable. Upper Abdomen: Imaging into the upper abdomen demonstrates no acute findings. Musculoskeletal: Chest wall soft tissues are unremarkable. No acute bony abnormality. IMPRESSION: Right middle lobe and clustered posterior right lower lobe pulmonary nodules are again noted and are stable since prior study. These are compatible with benign pulmonary nodules. Areas of scarring bilaterally. No new or enlarging pulmonary nodules. Coronary artery disease. Aortic Atherosclerosis (ICD10-I70.0). Electronically Signed   By:  Rolm Baptise M.D.   On: 12/13/2020 09:00    Assessment & Plan:   Problem List Items Addressed This Visit       Other   Elevated BP without diagnosis of hypertension    Worse Start Metoprolol bid RTC in 7-10 d      Relevant Orders   CBC with Differential/Platelet   Comprehensive metabolic panel   T4, free   TSH   Magnesium   Palpitations - Primary    New Start Metoprolol bid RTC in 7-10 d      Relevant Orders   CBC with Differential/Platelet   Comprehensive metabolic panel   T4, free   TSH  Magnesium   Other Visit Diagnoses     Irregular heartbeat       Relevant Orders   EKG 12-Lead         Meds ordered this encounter  Medications   metoprolol succinate (TOPROL-XL) 25 MG 24 hr tablet    Sig: Take 0.5 tablets (12.5 mg total) by mouth 2 (two) times daily.    Dispense:  30 tablet    Refill:  11      Follow-up: Return in about 1 week (around 04/27/2022) for a follow-up visit.  Walker Kehr, MD

## 2022-04-21 LAB — CBC WITH DIFFERENTIAL/PLATELET
Basophils Absolute: 0 10*3/uL (ref 0.0–0.1)
Basophils Relative: 0.4 % (ref 0.0–3.0)
Eosinophils Absolute: 0.1 10*3/uL (ref 0.0–0.7)
Eosinophils Relative: 1.8 % (ref 0.0–5.0)
HCT: 41.9 % (ref 36.0–46.0)
Hemoglobin: 14.3 g/dL (ref 12.0–15.0)
Lymphocytes Relative: 24 % (ref 12.0–46.0)
Lymphs Abs: 1.9 10*3/uL (ref 0.7–4.0)
MCHC: 34.1 g/dL (ref 30.0–36.0)
MCV: 87.7 fl (ref 78.0–100.0)
Monocytes Absolute: 0.8 10*3/uL (ref 0.1–1.0)
Monocytes Relative: 9.8 % (ref 3.0–12.0)
Neutro Abs: 5 10*3/uL (ref 1.4–7.7)
Neutrophils Relative %: 64 % (ref 43.0–77.0)
Platelets: 264 10*3/uL (ref 150.0–400.0)
RBC: 4.78 Mil/uL (ref 3.87–5.11)
RDW: 13.4 % (ref 11.5–15.5)
WBC: 7.8 10*3/uL (ref 4.0–10.5)

## 2022-04-27 ENCOUNTER — Encounter: Payer: Self-pay | Admitting: Internal Medicine

## 2022-04-27 ENCOUNTER — Ambulatory Visit: Payer: Medicare Other | Admitting: Internal Medicine

## 2022-04-27 VITALS — BP 122/70 | HR 75 | Temp 98.6°F | Ht 61.0 in | Wt 104.0 lb

## 2022-04-27 DIAGNOSIS — R002 Palpitations: Secondary | ICD-10-CM

## 2022-04-27 DIAGNOSIS — R011 Cardiac murmur, unspecified: Secondary | ICD-10-CM | POA: Diagnosis not present

## 2022-04-27 DIAGNOSIS — R030 Elevated blood-pressure reading, without diagnosis of hypertension: Secondary | ICD-10-CM

## 2022-04-27 MED ORDER — ROSUVASTATIN CALCIUM 5 MG PO TABS: 2.5000 mg | ORAL_TABLET | Freq: Every day | ORAL | 3 refills | Status: DC

## 2022-04-27 NOTE — Assessment & Plan Note (Signed)
Resolved Metoprolol bid

## 2022-04-27 NOTE — Assessment & Plan Note (Signed)
On Metoprolol bid

## 2022-04-27 NOTE — Progress Notes (Signed)
Subjective:  Patient ID: Joy Thompson, female    DOB: October 15, 1940  Age: 82 y.o. MRN: [id number]  CC: Follow-up   HPI Joy Thompson presents for HTN, palpitations Pt is doing well on Metoprolol  Outpatient Medications Prior to Visit  Medication Sig Dispense Refill   aspirin 81 MG EC tablet Take 81 mg by mouth daily.     calcium-vitamin D (OSCAL WITH D 500-200) 500-200 MG-UNIT per tablet Take 1.5 tablets by mouth.     Cholecalciferol 1000 UNITS tablet Take 1,000 Units by mouth daily.     CRANBERRY PO Take 1 tablet by mouth daily.     denosumab (PROLIA) 60 MG/ML SOLN injection Inject 60 mg into the skin every 6 (six) months. Administer in upper arm, thigh, or abdomen     metoprolol succinate (TOPROL-XL) 25 MG 24 hr tablet Take 0.5 tablets (12.5 mg total) by mouth 2 (two) times daily. 30 tablet 11   Multiple Vitamins-Minerals (MULTIVITAMIN,TX-MINERALS) tablet Take 1 tablet by mouth daily.     rosuvastatin (CRESTOR) 5 MG tablet TAKE ONE-HALF TABLET BY MOUTH  DAILY 45 tablet 3   No facility-administered medications prior to visit.    ROS: Review of Systems  Constitutional:  Negative for activity change, appetite change, chills, fatigue and unexpected weight change.  HENT:  Negative for congestion, mouth sores, sinus pressure and tinnitus.   Eyes:  Negative for visual disturbance.  Respiratory:  Negative for cough and chest tightness.   Cardiovascular:  Negative for chest pain, palpitations and leg swelling.  Gastrointestinal:  Negative for abdominal pain and nausea.  Genitourinary:  Negative for difficulty urinating, frequency and vaginal pain.  Musculoskeletal:  Negative for back pain and gait problem.  Skin:  Negative for pallor and rash.  Neurological:  Negative for dizziness, tremors, weakness, numbness and headaches.  Psychiatric/Behavioral:  Negative for confusion and sleep disturbance.     Objective:  BP 122/70 (BP Location: Right Arm, Patient Position: Sitting, Cuff  Size: Normal)   Pulse 75   Temp 98.6 F (37 C) (Oral)   Ht '5\' 1"'$  (1.549 m)   Wt 104 lb (47.2 kg)   SpO2 98%   BMI 19.65 kg/m   BP Readings from Last 3 Encounters:  04/27/22 122/70  04/20/22 (!) 160/90  12/09/21 (!) 142/84    Wt Readings from Last 3 Encounters:  04/27/22 104 lb (47.2 kg)  04/20/22 107 lb (48.5 kg)  12/09/21 104 lb 9.6 oz (47.4 kg)    Physical Exam Constitutional:      General: She is not in acute distress.    Appearance: Normal appearance. She is well-developed.  HENT:     Head: Normocephalic.     Right Ear: External ear normal.     Left Ear: External ear normal.     Nose: Nose normal.  Eyes:     General:        Right eye: No discharge.        Left eye: No discharge.     Conjunctiva/sclera: Conjunctivae normal.     Pupils: Pupils are equal, round, and reactive to light.  Neck:     Thyroid: No thyromegaly.     Vascular: No JVD.     Trachea: No tracheal deviation.  Cardiovascular:     Rate and Rhythm: Normal rate and regular rhythm.     Heart sounds: Murmur heard.  Pulmonary:     Effort: No respiratory distress.     Breath sounds: No stridor. No wheezing.  Abdominal:     General: Bowel sounds are normal. There is no distension.     Palpations: Abdomen is soft. There is no mass.     Tenderness: There is no abdominal tenderness. There is no guarding or rebound.  Musculoskeletal:        General: No tenderness.     Cervical back: Normal range of motion and neck supple. No rigidity.  Lymphadenopathy:     Cervical: No cervical adenopathy.  Skin:    Findings: No erythema or rash.  Neurological:     Cranial Nerves: No cranial nerve deficit.     Motor: No abnormal muscle tone.     Coordination: Coordination normal.     Deep Tendon Reflexes: Reflexes normal.  Psychiatric:        Behavior: Behavior normal.        Thought Content: Thought content normal.        Judgment: Judgment normal.     Lab Results  Component Value Date   WBC 7.8  04/20/2022   HGB 14.3 04/20/2022   HCT 41.9 04/20/2022   PLT 264.0 04/20/2022   GLUCOSE 103 (H) 04/20/2022   CHOL 128 12/09/2021   TRIG 75.0 12/09/2021   HDL 68.50 12/09/2021   LDLCALC 45 12/09/2021   ALT 19 04/20/2022   AST 22 04/20/2022   NA 137 04/20/2022   K 3.6 04/20/2022   CL 95 (L) 04/20/2022   CREATININE 0.66 04/20/2022   BUN 11 04/20/2022   CO2 31 04/20/2022   TSH 4.34 04/20/2022    CT Chest Wo Contrast  Result Date: 12/13/2020 CLINICAL DATA:  Follow-up pulmonary nodules.  Abnormal x-ray. EXAM: CT CHEST WITHOUT CONTRAST TECHNIQUE: Multidetector CT imaging of the chest was performed following the standard protocol without IV contrast. COMPARISON:  Chest CT 03/19/2019.  No recent chest x-ray. FINDINGS: Cardiovascular: Coronary artery and aortic calcifications. No evidence of aneurysm. Heart is normal size. Aorta is normal caliber. Mediastinum/Nodes: No mediastinal, hilar, or axillary adenopathy. Trachea and esophagus are unremarkable. Thyroid unremarkable. Lungs/Pleura: Areas of scarring in the apices and bases. No confluent airspace opacities, effusions or suspicious pulmonary nodules. Triangular nodule anteriorly in the right middle lobe measures 4 mm and is stable. Clustered nodules posteriorly in the right lower lobe again noted, stable. Upper Abdomen: Imaging into the upper abdomen demonstrates no acute findings. Musculoskeletal: Chest wall soft tissues are unremarkable. No acute bony abnormality. IMPRESSION: Right middle lobe and clustered posterior right lower lobe pulmonary nodules are again noted and are stable since prior study. These are compatible with benign pulmonary nodules. Areas of scarring bilaterally. No new or enlarging pulmonary nodules. Coronary artery disease. Aortic Atherosclerosis (ICD10-I70.0). Electronically Signed   By: Rolm Baptise M.D.   On: 12/13/2020 09:00    Assessment & Plan:   Problem List Items Addressed This Visit       Other   Elevated BP  without diagnosis of hypertension    On Metoprolol bid      Palpitations - Primary    Resolved Metoprolol bid      Relevant Orders   ECHOCARDIOGRAM COMPLETE   Other Visit Diagnoses     Heart murmur       Relevant Orders   ECHOCARDIOGRAM COMPLETE         Meds ordered this encounter  Medications   rosuvastatin (CRESTOR) 5 MG tablet    Sig: Take 0.5 tablets (2.5 mg total) by mouth daily.    Dispense:  45 tablet    Refill:  3    Please send a replace/new response with 90-Day Supply if appropriate to maximize member benefit. Requesting 1 year supply.      Follow-up: Return in about 3 months (around 07/26/2022) for a follow-up visit.  Walker Kehr, MD

## 2022-05-06 ENCOUNTER — Encounter: Payer: Self-pay | Admitting: Internal Medicine

## 2022-05-09 ENCOUNTER — Other Ambulatory Visit: Payer: Self-pay | Admitting: Internal Medicine

## 2022-05-09 DIAGNOSIS — R0989 Other specified symptoms and signs involving the circulatory and respiratory systems: Secondary | ICD-10-CM

## 2022-05-10 ENCOUNTER — Telehealth: Payer: Self-pay | Admitting: Internal Medicine

## 2022-05-10 DIAGNOSIS — R0989 Other specified symptoms and signs involving the circulatory and respiratory systems: Secondary | ICD-10-CM

## 2022-05-10 DIAGNOSIS — R002 Palpitations: Secondary | ICD-10-CM

## 2022-05-10 NOTE — Telephone Encounter (Signed)
Patient called and said that a prior authorization needs to be done for her echo cardiogram and vascular ultrasound

## 2022-05-10 NOTE — Telephone Encounter (Signed)
Called pt inform her the prior authorizations will be done by the [facility]. I don't see orders for echocardiogram & vascular u/s. Pls advise

## 2022-05-11 NOTE — Telephone Encounter (Signed)
Pt called back to check on the status of the prior authorization.

## 2022-05-11 NOTE — Telephone Encounter (Signed)
MD put order in wrong. Had to re-enter under Vas Carotid doppler. Called pt inform her once the echocardiogram get precerted they will call her to set appt up. Gave her # [phone] to call t set the vascular u/s up.Marland KitchenJohny Chess

## 2022-05-11 NOTE — Telephone Encounter (Signed)
Tests were ordered on 2/6 on 2/18.  Thanks

## 2022-05-12 ENCOUNTER — Ambulatory Visit (HOSPITAL_COMMUNITY): Payer: Medicare Other | Attending: Internal Medicine

## 2022-05-12 DIAGNOSIS — R002 Palpitations: Secondary | ICD-10-CM | POA: Diagnosis not present

## 2022-05-12 DIAGNOSIS — R011 Cardiac murmur, unspecified: Secondary | ICD-10-CM | POA: Insufficient documentation

## 2022-05-12 LAB — ECHOCARDIOGRAM COMPLETE
Area-P 1/2: 4.8 cm2
MV M vel: 5.83 m/s
MV Peak grad: 136 mmHg
Radius: 0.5 cm
S' Lateral: 3 cm

## 2022-05-17 ENCOUNTER — Ambulatory Visit (HOSPITAL_COMMUNITY)
Admission: RE | Admit: 2022-05-17 | Discharge: 2022-05-17 | Disposition: A | Discharge status: Home / Self Care | Payer: Medicare Other | Source: Ambulatory Visit | Attending: Internal Medicine | Admitting: Internal Medicine

## 2022-05-17 DIAGNOSIS — R002 Palpitations: Secondary | ICD-10-CM

## 2022-05-17 DIAGNOSIS — R0989 Other specified symptoms and signs involving the circulatory and respiratory systems: Secondary | ICD-10-CM | POA: Diagnosis present

## 2022-05-21 ENCOUNTER — Encounter: Payer: Self-pay | Admitting: Internal Medicine

## 2022-06-07 NOTE — Telephone Encounter (Signed)
Prolia VOB initiated via [email or web address]  Last Prolia inj: 01/08/22 Next Prolia inj DUE:  07/08/22

## 2022-06-09 ENCOUNTER — Other Ambulatory Visit (HOSPITAL_COMMUNITY): Payer: Self-pay

## 2022-06-09 ENCOUNTER — Ambulatory Visit (INDEPENDENT_AMBULATORY_CARE_PROVIDER_SITE_OTHER): Payer: Medicare Other | Admitting: Internal Medicine

## 2022-06-09 ENCOUNTER — Encounter: Payer: Self-pay | Admitting: Internal Medicine

## 2022-06-09 VITALS — BP 110/80 | HR 72 | Temp 98.6°F | Ht 61.0 in | Wt 105.0 lb

## 2022-06-09 DIAGNOSIS — H6123 Impacted cerumen, bilateral: Secondary | ICD-10-CM

## 2022-06-09 DIAGNOSIS — Z0000 Encounter for general adult medical examination without abnormal findings: Secondary | ICD-10-CM | POA: Diagnosis not present

## 2022-06-09 DIAGNOSIS — E785 Hyperlipidemia, unspecified: Secondary | ICD-10-CM

## 2022-06-09 DIAGNOSIS — R002 Palpitations: Secondary | ICD-10-CM | POA: Diagnosis not present

## 2022-06-09 DIAGNOSIS — M810 Age-related osteoporosis without current pathological fracture: Secondary | ICD-10-CM

## 2022-06-09 MED ORDER — METOPROLOL SUCCINATE ER 25 MG PO TB24: 25.0000 mg | ORAL_TABLET | Freq: Every evening | ORAL | 3 refills | Status: DC

## 2022-06-09 NOTE — Assessment & Plan Note (Signed)
Cont Prolia Vit D DEXA ordered

## 2022-06-09 NOTE — Telephone Encounter (Signed)
Hey just wanted to make sure we are all set to schedule patient for her prolia shot on or after 07/08/22, and if there was any payment due at the time of injection.

## 2022-06-09 NOTE — Telephone Encounter (Signed)
Called pt gave her prolia coverage review. Pt agreed made appt for 07/08/22 w/ copay of $327.Marland KitchenChryl Heck

## 2022-06-09 NOTE — Progress Notes (Signed)
Subjective:  Patient ID: Joy Thompson, female    DOB: 1941-03-19  Age: 82 y.o. MRN: [id number]  CC: Annual Exam (PHYSICAL)   HPI SHANEQUE FERONE presents for a well exam  Outpatient Medications Prior to Visit  Medication Sig Dispense Refill   aspirin 81 MG EC tablet Take 81 mg by mouth daily.     calcium-vitamin D (OSCAL WITH D 500-200) 500-200 MG-UNIT per tablet Take 1.5 tablets by mouth.     Cholecalciferol 1000 UNITS tablet Take 1,000 Units by mouth daily.     CRANBERRY PO Take 1 tablet by mouth daily.     denosumab (PROLIA) 60 MG/ML SOLN injection Inject 60 mg into the skin every 6 (six) months. Administer in upper arm, thigh, or abdomen     Multiple Vitamins-Minerals (MULTIVITAMIN,TX-MINERALS) tablet Take 1 tablet by mouth daily.     rosuvastatin (CRESTOR) 5 MG tablet Take 0.5 tablets (2.5 mg total) by mouth daily. 45 tablet 3   metoprolol succinate (TOPROL-XL) 25 MG 24 hr tablet Take 0.5 tablets (12.5 mg total) by mouth 2 (two) times daily. 30 tablet 11   No facility-administered medications prior to visit.    ROS: Review of Systems  Constitutional:  Negative for activity change, appetite change, chills, fatigue and unexpected weight change.  HENT:  Negative for congestion, mouth sores and sinus pressure.   Eyes:  Negative for visual disturbance.  Respiratory:  Negative for cough and chest tightness.   Gastrointestinal:  Negative for abdominal pain and nausea.  Genitourinary:  Negative for difficulty urinating, frequency and vaginal pain.  Musculoskeletal:  Negative for back pain and gait problem.  Skin:  Negative for pallor and rash.  Neurological:  Negative for dizziness, tremors, weakness, numbness and headaches.  Psychiatric/Behavioral:  Negative for confusion and sleep disturbance.     Objective:  BP 110/80 (BP Location: Left Arm, Patient Position: Sitting, Cuff Size: Large)   Pulse 72   Temp 98.6 F (37 C) (Oral)   Ht 5\' 1"  (1.549 m)   Wt 105 lb (47.6 kg)    SpO2 97%   BMI 19.84 kg/m   BP Readings from Last 3 Encounters:  06/09/22 110/80  04/27/22 122/70  04/20/22 (!) 160/90    Wt Readings from Last 3 Encounters:  06/09/22 105 lb (47.6 kg)  04/27/22 104 lb (47.2 kg)  04/20/22 107 lb (48.5 kg)    Physical Exam Constitutional:      General: She is not in acute distress.    Appearance: Normal appearance. She is well-developed.  HENT:     Head: Normocephalic.     Right Ear: External ear normal.     Left Ear: External ear normal.     Nose: Nose normal.  Eyes:     General:        Right eye: No discharge.        Left eye: No discharge.     Conjunctiva/sclera: Conjunctivae normal.     Pupils: Pupils are equal, round, and reactive to light.  Neck:     Thyroid: No thyromegaly.     Vascular: No JVD.     Trachea: No tracheal deviation.  Cardiovascular:     Rate and Rhythm: Normal rate and regular rhythm.     Heart sounds: Normal heart sounds.  Pulmonary:     Effort: No respiratory distress.     Breath sounds: No stridor. No wheezing.  Abdominal:     General: Bowel sounds are normal. There is no distension.  Palpations: Abdomen is soft. There is no mass.     Tenderness: There is no abdominal tenderness. There is no guarding or rebound.  Musculoskeletal:        General: No tenderness.     Cervical back: Normal range of motion and neck supple. No rigidity.  Lymphadenopathy:     Cervical: No cervical adenopathy.  Skin:    Findings: No erythema or rash.  Neurological:     Cranial Nerves: No cranial nerve deficit.     Motor: No abnormal muscle tone.     Coordination: Coordination normal.     Deep Tendon Reflexes: Reflexes normal.  Psychiatric:        Behavior: Behavior normal.        Thought Content: Thought content normal.        Judgment: Judgment normal.     Lab Results  Component Value Date   WBC 7.8 04/20/2022   HGB 14.3 04/20/2022   HCT 41.9 04/20/2022   PLT 264.0 04/20/2022   GLUCOSE 103 (H) 04/20/2022    CHOL 128 12/09/2021   TRIG 75.0 12/09/2021   HDL 68.50 12/09/2021   LDLCALC 45 12/09/2021   ALT 19 04/20/2022   AST 22 04/20/2022   NA 137 04/20/2022   K 3.6 04/20/2022   CL 95 (L) 04/20/2022   CREATININE 0.66 04/20/2022   BUN 11 04/20/2022   CO2 31 04/20/2022   TSH 4.34 04/20/2022    VAS US CAROTID  Result Date: 05/17/2022 Carotid Arterial Duplex Study Patient Name:  Joy Thompson  Date of Exam:   05/17/2022 Medical Rec #: [id number]        Accession #:    [id number] Date of Birth: 07/05/1940        Patient Gender: F Patient Age:   47 years Exam Location:  [facility] Procedure:      VAS US CAROTID Referring Phys: Tyrone Apple Anayia Eugene --------------------------------------------------------------------------------  Indications:  Patient reports hearing hearbeat/pulsating in the left ear; mostly               at night but it is not consistent in that one day it will be there               and one day it will not. She denies other cerebrovascular               symptoms. Risk Factors: No history of smoking. Performing Technologist: Mariane Masters RVT  Examination Guidelines: A complete evaluation includes B-mode imaging, spectral Doppler, color Doppler, and power Doppler as needed of all accessible portions of each vessel. Bilateral testing is considered an integral part of a complete examination. Limited examinations for reoccurring indications may be performed as noted.  Right Carotid Findings: +----------+--------+--------+--------+------------------+--------+           PSV cm/sEDV cm/sStenosisPlaque DescriptionComments +----------+--------+--------+--------+------------------+--------+ CCA Prox  67      9                                          +----------+--------+--------+--------+------------------+--------+ CCA Distal61      12                                         +----------+--------+--------+--------+------------------+--------+ ICA Prox  56      12                                          +----------+--------+--------+--------+------------------+--------+  ICA Mid   68      16      Normal                             +----------+--------+--------+--------+------------------+--------+ ICA Distal55      16                                         +----------+--------+--------+--------+------------------+--------+ ECA       75      5                                          +----------+--------+--------+--------+------------------+--------+ +----------+--------+-------+----------------+-------------------+           PSV cm/sEDV cmsDescribe        Arm Pressure (mmHG) +----------+--------+-------+----------------+-------------------+ [id number]      0      Multiphasic, WNL                    +----------+--------+-------+----------------+-------------------+ +---------+--------+--+--------+--+---------+ VertebralPSV cm/s58EDV cm/s12Antegrade +---------+--------+--+--------+--+---------+  Left Carotid Findings: +----------+--------+--------+--------+------------------+--------------+           PSV cm/sEDV cm/sStenosisPlaque DescriptionComments       +----------+--------+--------+--------+------------------+--------------+ CCA Prox  99      12                                               +----------+--------+--------+--------+------------------+--------------+ CCA Distal59      13                                               +----------+--------+--------+--------+------------------+--------------+ ICA Prox  52      14              heterogenous      minimal plaque +----------+--------+--------+--------+------------------+--------------+ ICA Mid   68      17      1-39%                                    +----------+--------+--------+--------+------------------+--------------+ ICA Distal109     29                                                +----------+--------+--------+--------+------------------+--------------+ ECA       54      2                                                +----------+--------+--------+--------+------------------+--------------+ +----------+--------+--------+----------------+-------------------+           PSV cm/sEDV cm/sDescribe        Arm Pressure (mmHG) +----------+--------+--------+----------------+-------------------+ [id number]     0       Multiphasic, WNL                    +----------+--------+--------+----------------+-------------------+ +---------+--------+--+--------+-+---------+  VertebralPSV cm/s48EDV cm/s9Antegrade +---------+--------+--+--------+-+---------+   Summary: Right Carotid: There is no evidence of stenosis in the right ICA. The                extracranial vessels were near-normal with only minimal wall                thickening or plaque. Left Carotid: Velocities in the left ICA are consistent with a 1-39% stenosis. Vertebrals:  Bilateral vertebral arteries demonstrate antegrade flow. Subclavians: Normal flow hemodynamics were seen in bilateral subclavian              arteries. *See table(s) above for measurements and observations.  Electronically signed by Jenkins Rouge MD on 05/17/2022 at 11:34:15 AM.    Final     Assessment & Plan:   Problem List Items Addressed This Visit       Musculoskeletal and Integument   Osteoporosis    Cont Prolia Vit D DEXA ordered      Relevant Orders   DG Bone Density     Other   Well adult exam - Primary    We discussed age appropriate health related issues, including available/recomended screening tests and vaccinations. Labs were ordered to be later reviewed . All questions were answered. We discussed one or more of the following - seat belt use, use of sunscreen/sun exposure exercise, fall risk reduction, second hand smoke exposure, firearm use and storage, seat belt use, a need for adhering to healthy diet and  exercise. Labs were ordered.  All questions were answered. 2020 CT ca scoring test - Coronary calcium score of 217.  Balance exercises DEXA      Relevant Orders   TSH   Urinalysis   CBC with Differential/Platelet   Lipid panel   Comprehensive metabolic panel   Palpitations   Relevant Orders   TSH   Urinalysis   CBC with Differential/Platelet   Lipid panel   Comprehensive metabolic panel   Dyslipidemia   Relevant Orders   TSH   Lipid panel      Meds ordered this encounter  Medications   metoprolol succinate (TOPROL-XL) 25 MG 24 hr tablet    Sig: Take 1 tablet (25 mg total) by mouth at bedtime.    Dispense:  90 tablet    Refill:  3      Follow-up: Return in about 6 months (around 12/10/2022) for a follow-up visit.  Walker Kehr, MD

## 2022-06-09 NOTE — Assessment & Plan Note (Addendum)
We discussed age appropriate health related issues, including available/recomended screening tests and vaccinations. Labs were ordered to be later reviewed . All questions were answered. We discussed one or more of the following - seat belt use, use of sunscreen/sun exposure exercise, fall risk reduction, second hand smoke exposure, firearm use and storage, seat belt use, a need for adhering to healthy diet and exercise. Labs were ordered.  All questions were answered. 2020 CT ca scoring test - Coronary calcium score of 217.  Balance exercises DEXA

## 2022-06-09 NOTE — Telephone Encounter (Signed)
Pt ready for scheduling for Prolia on or after : 07/08/22  Out-of-pocket cost due at time of visit: $327  Primary: [employer] - Medicare Prolia co-insurance: 20% Admin fee co-insurance: 20%  Secondary: N/A Prolia co-insurance:  Admin fee co-insurance:   Medical Benefit Details: Date Benefits were checked: 06/07/22 Deductible: $190 ($190 met)/ Coinsurance: 20%/ Admin Fee: 20%  Prior Auth: approved PA# [id number] Expiration Date: 06/09/23   Pharmacy benefit: Copay $529.44 If patient wants fill through the pharmacy benefit please send prescription to: [facility], and include estimated need by date in rx notes. Pharmacy will ship medication directly to the office.  Patient NOT eligible for Prolia Copay Card. Copay Card can make patient's cost as little as $25. Link to apply: [email or web address]://[email or web address]  ** This summary of benefits is an estimation of the patient's out-of-pocket cost. Exact cost may very based on individual plan coverage.

## 2022-06-10 NOTE — Addendum Note (Signed)
Addended by: Marijean Heath R on: 06/10/2022 02:15 PM   Modules accepted: Orders

## 2022-06-23 ENCOUNTER — Ambulatory Visit (INDEPENDENT_AMBULATORY_CARE_PROVIDER_SITE_OTHER)
Admission: RE | Admit: 2022-06-23 | Discharge: 2022-06-23 | Disposition: A | Discharge status: Home / Self Care | Payer: Medicare Other | Source: Ambulatory Visit | Attending: Internal Medicine | Admitting: Internal Medicine

## 2022-06-23 DIAGNOSIS — M810 Age-related osteoporosis without current pathological fracture: Secondary | ICD-10-CM

## 2022-07-08 ENCOUNTER — Ambulatory Visit: Payer: Medicare Other | Admitting: *Deleted

## 2022-07-08 DIAGNOSIS — M810 Age-related osteoporosis without current pathological fracture: Secondary | ICD-10-CM | POA: Diagnosis not present

## 2022-07-08 MED ORDER — DENOSUMAB 60 MG/ML ~~LOC~~ SOSY
60.0000 mg | PREFILLED_SYRINGE | Freq: Once | SUBCUTANEOUS | Status: AC
  Administered 2022-07-08: 60 mg via SUBCUTANEOUS

## 2022-07-08 NOTE — Progress Notes (Addendum)
Pls cosign for Prolia inj../lmb  Medical screening examination/treatment/procedure(s) were performed by non-physician practitioner and as supervising physician I was immediately available for consultation/collaboration.  I agree with above. Aleksei Plotnikov, MD  
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
## 2022-10-28 ENCOUNTER — Emergency Department (HOSPITAL_BASED_OUTPATIENT_CLINIC_OR_DEPARTMENT_OTHER): Payer: Medicare Other

## 2022-10-28 ENCOUNTER — Other Ambulatory Visit: Payer: Self-pay

## 2022-10-28 ENCOUNTER — Encounter (HOSPITAL_BASED_OUTPATIENT_CLINIC_OR_DEPARTMENT_OTHER): Payer: Self-pay

## 2022-10-28 ENCOUNTER — Emergency Department (HOSPITAL_BASED_OUTPATIENT_CLINIC_OR_DEPARTMENT_OTHER)
Admission: EM | Admit: 2022-10-28 | Discharge: 2022-10-28 | Disposition: A | Discharge status: Home / Self Care | Payer: Medicare Other | Attending: Emergency Medicine | Admitting: Emergency Medicine

## 2022-10-28 DIAGNOSIS — S0990XA Unspecified injury of head, initial encounter: Secondary | ICD-10-CM | POA: Diagnosis present

## 2022-10-28 DIAGNOSIS — S12491A Other nondisplaced fracture of fifth cervical vertebra, initial encounter for closed fracture: Secondary | ICD-10-CM | POA: Diagnosis not present

## 2022-10-28 DIAGNOSIS — S0993XA Unspecified injury of face, initial encounter: Secondary | ICD-10-CM | POA: Diagnosis not present

## 2022-10-28 DIAGNOSIS — W1839XA Other fall on same level, initial encounter: Secondary | ICD-10-CM | POA: Diagnosis not present

## 2022-10-28 DIAGNOSIS — Y92511 Restaurant or cafe as the place of occurrence of the external cause: Secondary | ICD-10-CM | POA: Diagnosis not present

## 2022-10-28 DIAGNOSIS — Z7982 Long term (current) use of aspirin: Secondary | ICD-10-CM | POA: Insufficient documentation

## 2022-10-28 DIAGNOSIS — W19XXXA Unspecified fall, initial encounter: Secondary | ICD-10-CM
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
## 2022-10-28 NOTE — Discharge Instructions (Addendum)
Please follow up with your PCP, no surgical follow necessary in regards to cervical spine fracture   Based on the events which brought you to the ER today, it is possible that you may have a concussion. A concussion occurs when there is a blow to the head or body, with enough force to shake the brain and disrupt how the brain functions. You may experience symptoms such as headaches, sensitivity to light/noise, dizziness, cognitive slowing, difficulty concentrating / remembering, trouble sleeping and drowsiness. These symptoms may last anywhere from hours/days to potentially weeks/months. While these symptoms are very frustrating and perhaps debilitating, it is important that you remember that they will improve over time. Everyone has a different rate of recovery; it is difficult to predict when your symptoms will resolve. In order to allow for your brain to heal after the injury, we recommend that you see your primary physician or a physician knowledgeable in concussion management. We also advise you to let your body and brain rest: avoid physical activities (sports, gym, and exercise) and reduce cognitive demands (reading, texting, TV watching, computer use, video games, etc). School attendance, after-school activities and work may need to be modified to avoid increasing symptoms. We recommend against driving until until all symptoms have resolved. Come back to the ER right away if you are having repeated episodes of vomiting, severe/worsening headache/dizziness or any other symptom that alarms you. We recommended that someone stay with you for the next 24 hours to monitor for these worrisome symptoms.    It was a pleasure caring for you today in the emergency department.  Please return to the emergency department for any worsening or worrisome symptoms.

## 2022-10-28 NOTE — ED Triage Notes (Signed)
Patient arrives with complaints of falling outside of a restaurant. Patients foot got caught while walking, causing her to fall face down on concrete. Nose bled at the scene but resolved.  No LOC.   She takes a 81mg  Asprin every other day. Reports no pain

## 2022-10-29 ENCOUNTER — Telehealth: Payer: Self-pay

## 2022-10-29 NOTE — Transitions of Care (Post Inpatient/ED Visit) (Signed)
   10/29/2022  Name: Joy Thompson MRN: [id number] DOB: [birth year]  Today's TOC FU Call Status: Today's TOC FU Call Status:: Successful TOC FU Call Completed TOC FU Call Complete Date: 10/29/22  Transition Care Management Follow-up Telephone Call Date of Discharge: 10/28/22 Discharge Facility: [facility] ([facility] Type of Discharge: Emergency Department Reason for ED Visit: Other: (fall) How have you been since you were released from the hospital?: Better Any questions or concerns?: No  Items Reviewed: Did you receive and understand the discharge instructions provided?: Yes Medications obtained,verified, and reconciled?: Yes (Medications Reviewed) Any new allergies since your discharge?: No Dietary orders reviewed?: NA Do you have support at home?: Yes  Medications Reviewed Today: Medications Reviewed Today     Reviewed by Barb Merino, LPN (Licensed Practical Nurse) on 10/29/22 at 1028  Med List Status: <None>   Medication Order Taking? Sig Documenting Provider Last Dose Status Informant  aspirin 81 MG EC tablet [id number] No Take 81 mg by mouth daily. [provider] Taking Active   calcium-vitamin D (OSCAL WITH D 500-200) 500-200 MG-UNIT per tablet [id number] No Take 1.5 tablets by mouth. [provider] Taking Active   Cholecalciferol 1000 UNITS tablet [id number] No Take 1,000 Units by mouth daily. [provider] Taking Active   CRANBERRY PO [id number] No Take 1 tablet by mouth daily. [provider] Taking Active   denosumab (PROLIA) 60 MG/ML SOLN injection [id number] No Inject 60 mg into the skin every 6 (six) months. Administer in upper arm, thigh, or abdomen [provider] Taking Active   metoprolol succinate (TOPROL-XL) 25 MG 24 hr tablet [id number]  Take 1 tablet (25 mg total) by mouth at bedtime. Plotnikov, Georgina Quint, MD  Active   Multiple Vitamins-Minerals (MULTIVITAMIN,TX-MINERALS) tablet [id number] No Take 1 tablet by mouth  daily. [provider] Taking Active   rosuvastatin (CRESTOR) 5 MG tablet [id number] No Take 0.5 tablets (2.5 mg total) by mouth daily. Plotnikov, Georgina Quint, MD Taking Active             Home Care and Equipment/Supplies: Were Home Health Services Ordered?: NA Any new equipment or medical supplies ordered?: NA  Functional Questionnaire: Do you need assistance with bathing/showering or dressing?: No Do you need assistance with meal preparation?: No Do you need assistance with eating?: No Do you have difficulty maintaining continence: No Do you need assistance with getting out of bed/getting out of a chair/moving?: No Do you have difficulty managing or taking your medications?: No  Follow up appointments reviewed: PCP Follow-up appointment confirmed?: No (says daughter will call to make appointment) Specialist Hospital Follow-up appointment confirmed?: NA Do you need transportation to your follow-up appointment?: No Do you understand care options if your condition(s) worsen?: Yes-patient verbalized understanding    SIGNATURE Elisha Ponder LPN [facility] AWV Team Direct dial:  [phone]

## 2022-11-03 ENCOUNTER — Encounter: Payer: Self-pay | Admitting: Internal Medicine

## 2022-11-03 ENCOUNTER — Other Ambulatory Visit: Payer: Self-pay | Admitting: Internal Medicine

## 2022-11-03 ENCOUNTER — Ambulatory Visit: Payer: Medicare Other | Admitting: Internal Medicine

## 2022-11-03 VITALS — BP 130/80 | HR 75 | Temp 98.0°F | Ht 61.0 in | Wt 105.0 lb

## 2022-11-03 DIAGNOSIS — E785 Hyperlipidemia, unspecified: Secondary | ICD-10-CM | POA: Diagnosis not present

## 2022-11-03 DIAGNOSIS — S12491D Other nondisplaced fracture of fifth cervical vertebra, subsequent encounter for fracture with routine healing: Secondary | ICD-10-CM | POA: Diagnosis not present

## 2022-11-03 DIAGNOSIS — Z0000 Encounter for general adult medical examination without abnormal findings: Secondary | ICD-10-CM

## 2022-11-03 DIAGNOSIS — S0033XD Contusion of nose, subsequent encounter: Secondary | ICD-10-CM | POA: Diagnosis not present

## 2022-11-03 DIAGNOSIS — W1800XA Striking against unspecified object with subsequent fall, initial encounter: Secondary | ICD-10-CM | POA: Diagnosis not present

## 2022-11-03 DIAGNOSIS — S12400A Unspecified displaced fracture of fifth cervical vertebra, initial encounter for closed fracture: Secondary | ICD-10-CM | POA: Insufficient documentation

## 2022-11-03 DIAGNOSIS — S0033XA Contusion of nose, initial encounter: Secondary | ICD-10-CM | POA: Insufficient documentation

## 2022-11-03 LAB — LIPID PANEL
Cholesterol: 126 mg/dL (ref 0–200)
HDL: 59.5 mg/dL (ref >39.00)
LDL Cholesterol: 41 mg/dL (ref 0–99)
NonHDL: 66.64
Total CHOL/HDL Ratio: 2
Triglycerides: 129 mg/dL (ref 0.0–149.0)
VLDL: 25.8 mg/dL (ref 0.0–40.0)

## 2022-11-03 LAB — COMPREHENSIVE METABOLIC PANEL
ALT: 15 U/L (ref 0–35)
AST: 19 U/L (ref 0–37)
Albumin: 4.3 g/dL (ref 3.5–5.2)
Alkaline Phosphatase: 58 U/L (ref 39–117)
BUN: 10 mg/dL (ref 6–23)
CO2: 32 mEq/L (ref 19–32)
Calcium: 9.8 mg/dL (ref 8.4–10.5)
Chloride: 95 mEq/L — ABNORMAL LOW (ref 96–112)
Creatinine, Ser: 0.76 mg/dL (ref 0.40–1.20)
GFR: 73.08 mL/min (ref >60.00)
Glucose, Bld: 99 mg/dL (ref 70–99)
Potassium: 3.9 mEq/L (ref 3.5–5.1)
Sodium: 134 mEq/L — ABNORMAL LOW (ref 135–145)
Total Bilirubin: 0.4 mg/dL (ref 0.2–1.2)
Total Protein: 6.9 g/dL (ref 6.0–8.3)

## 2022-11-03 LAB — CBC WITH DIFFERENTIAL/PLATELET
Basophils Absolute: 0 10*3/uL (ref 0.0–0.1)
Basophils Relative: 0.6 % (ref 0.0–3.0)
Eosinophils Absolute: 0.1 10*3/uL (ref 0.0–0.7)
Eosinophils Relative: 1.5 % (ref 0.0–5.0)
HCT: 39.3 % (ref 36.0–46.0)
Hemoglobin: 12.8 g/dL (ref 12.0–15.0)
Lymphocytes Relative: 15.3 % (ref 12.0–46.0)
Lymphs Abs: 1.2 10*3/uL (ref 0.7–4.0)
MCHC: 32.7 g/dL (ref 30.0–36.0)
MCV: 89.5 fl (ref 78.0–100.0)
Monocytes Absolute: 0.8 10*3/uL (ref 0.1–1.0)
Monocytes Relative: 10.3 % (ref 3.0–12.0)
Neutro Abs: 5.8 10*3/uL (ref 1.4–7.7)
Neutrophils Relative %: 72.3 % (ref 43.0–77.0)
Platelets: 304 10*3/uL (ref 150.0–400.0)
RBC: 4.39 Mil/uL (ref 3.87–5.11)
RDW: 12.9 % (ref 11.5–15.5)
WBC: 8 10*3/uL (ref 4.0–10.5)

## 2022-11-03 LAB — URINALYSIS, ROUTINE W REFLEX MICROSCOPIC
Bilirubin Urine: NEGATIVE
Hgb urine dipstick: NEGATIVE
Ketones, ur: NEGATIVE
Nitrite: POSITIVE — AB
Specific Gravity, Urine: 1.01 (ref 1.000–1.030)
Total Protein, Urine: NEGATIVE
Urine Glucose: NEGATIVE
Urobilinogen, UA: 0.2 (ref 0.0–1.0)
pH: 7 (ref 5.0–8.0)

## 2022-11-03 LAB — TSH: TSH: 3.61 u[IU]/mL (ref 0.35–5.50)

## 2022-11-03 MED ORDER — CEPHALEXIN 500 MG PO CAPS: 500.0000 mg | ORAL_CAPSULE | Freq: Three times a day (TID) | ORAL | 0 refills | Status: DC

## 2022-11-03 NOTE — Assessment & Plan Note (Signed)
S/p a fall on the pavement face down at the [employer] 10/28/22. No concussion. Soft neck collar prn C5 nondisplaced spinous process fx RTC 6 wks

## 2022-11-03 NOTE — Assessment & Plan Note (Signed)
On Crestor 

## 2022-11-03 NOTE — Progress Notes (Signed)
Subjective:  Patient ID: Joy Thompson, female    DOB: 10-22-1940  Age: 82 y.o. MRN: [id number]  CC: Hospitalization Follow-up (Pt had a fall x1 week ago and injured neck)   HPI [name] presents for a fall on the pavement face down at the [facility] 10/28/22 F/u on C5 nondisplaced spinous process fx No neck pain now, full rom, no paresthesias to UE Pt denies LOC, N/v, memory loss   Outpatient Medications Prior to Visit  Medication Sig Dispense Refill   aspirin 81 MG EC tablet Take 81 mg by mouth daily.     calcium-vitamin D (OSCAL WITH D 500-200) 500-200 MG-UNIT per tablet Take 1.5 tablets by mouth.     Cholecalciferol 1000 UNITS tablet Take 1,000 Units by mouth daily.     CRANBERRY PO Take 1 tablet by mouth daily.     denosumab (PROLIA) 60 MG/ML SOLN injection Inject 60 mg into the skin every 6 (six) months. Administer in upper arm, thigh, or abdomen     metoprolol succinate (TOPROL-XL) 25 MG 24 hr tablet Take 1 tablet (25 mg total) by mouth at bedtime. 90 tablet 3   Multiple Vitamins-Minerals (MULTIVITAMIN,TX-MINERALS) tablet Take 1 tablet by mouth daily.     rosuvastatin (CRESTOR) 5 MG tablet Take 0.5 tablets (2.5 mg total) by mouth daily. 45 tablet 3   No facility-administered medications prior to visit.    ROS: Review of Systems  Constitutional:  Negative for activity change, appetite change, chills, fatigue and unexpected weight change.  HENT:  Negative for congestion, mouth sores and sinus pressure.   Eyes:  Negative for visual disturbance.  Respiratory:  Negative for cough and chest tightness.   Gastrointestinal:  Negative for abdominal pain and nausea.  Genitourinary:  Negative for difficulty urinating, frequency and vaginal pain.  Musculoskeletal:  Positive for neck pain and neck stiffness. Negative for back pain and gait problem.  Skin:  Negative for pallor and rash.  Neurological:  Negative for dizziness, tremors, weakness, numbness and headaches.   Psychiatric/Behavioral:  Negative for confusion and sleep disturbance.     Objective:  BP 130/80 (BP Location: Right Arm, Patient Position: Sitting, Cuff Size: Large)   Pulse 75   Temp 98 F (36.7 C) (Oral)   Ht 5\' 1"  (1.549 m)   Wt 105 lb (47.6 kg)   SpO2 95%   BMI 19.84 kg/m   BP Readings from Last 3 Encounters:  11/03/22 130/80  10/28/22 (!) 174/82  06/09/22 110/80    Wt Readings from Last 3 Encounters:  11/03/22 105 lb (47.6 kg)  10/28/22 106 lb (48.1 kg)  06/09/22 105 lb (47.6 kg)    Physical Exam Constitutional:      General: She is not in acute distress.    Appearance: Normal appearance. She is well-developed.  HENT:     Head: Normocephalic.     Right Ear: External ear normal.     Left Ear: External ear normal.     Nose: Nose normal.  Eyes:     General:        Right eye: No discharge.        Left eye: No discharge.     Conjunctiva/sclera: Conjunctivae normal.     Pupils: Pupils are equal, round, and reactive to light.  Neck:     Thyroid: No thyromegaly.     Vascular: No JVD.     Trachea: No tracheal deviation.  Cardiovascular:     Rate and Rhythm: Normal rate and regular  rhythm.     Heart sounds: Normal heart sounds.  Pulmonary:     Effort: No respiratory distress.     Breath sounds: No stridor. No wheezing.  Abdominal:     General: Bowel sounds are normal. There is no distension.     Palpations: Abdomen is soft. There is no mass.     Tenderness: There is no abdominal tenderness. There is no guarding or rebound.  Musculoskeletal:     Cervical back: Normal range of motion and neck supple. Tenderness present. No rigidity.  Lymphadenopathy:     Cervical: No cervical adenopathy.  Skin:    Findings: No erythema or rash.  Neurological:     Cranial Nerves: No cranial nerve deficit.     Motor: No abnormal muscle tone.     Coordination: Coordination normal.     Deep Tendon Reflexes: Reflexes normal.  Psychiatric:        Behavior: Behavior normal.         Thought Content: Thought content normal.        Judgment: Judgment normal.     Lab Results  Component Value Date   WBC 7.8 04/20/2022   HGB 14.3 04/20/2022   HCT 41.9 04/20/2022   PLT 264.0 04/20/2022   GLUCOSE 103 (H) 04/20/2022   CHOL 128 12/09/2021   TRIG 75.0 12/09/2021   HDL 68.50 12/09/2021   LDLCALC 45 12/09/2021   ALT 19 04/20/2022   AST 22 04/20/2022   NA 137 04/20/2022   K 3.6 04/20/2022   CL 95 (L) 04/20/2022   CREATININE 0.66 04/20/2022   BUN 11 04/20/2022   CO2 31 04/20/2022   TSH 4.34 04/20/2022    CT Maxillofacial Wo Contrast  Result Date: 10/28/2022 CLINICAL DATA:  Facial trauma, blunt.  Fall. EXAM: CT MAXILLOFACIAL WITHOUT CONTRAST TECHNIQUE: Multidetector CT imaging of the maxillofacial structures was performed. Multiplanar CT image reconstructions were also generated. RADIATION DOSE REDUCTION: This exam was performed according to the departmental dose-optimization program which includes automated exposure control, adjustment of the mA and/or kV according to patient size and/or use of iterative reconstruction technique. COMPARISON:  None available FINDINGS: Osseous: No fracture or mandibular dislocation. No destructive process. Orbits: Negative. No traumatic or inflammatory finding. Sinuses: Clear Soft tissues: Negative Limited intracranial: See head CT report IMPRESSION: No facial or orbital fracture. Electronically Signed   By: Charlett Nose M.D.   On: 10/28/2022 19:21   CT Cervical Spine Wo Contrast  Result Date: 10/28/2022 CLINICAL DATA:  Neck trauma (Age >= 65y) EXAM: CT CERVICAL SPINE WITHOUT CONTRAST TECHNIQUE: Multidetector CT imaging of the cervical spine was performed without intravenous contrast. Multiplanar CT image reconstructions were also generated. RADIATION DOSE REDUCTION: This exam was performed according to the departmental dose-optimization program which includes automated exposure control, adjustment of the mA and/or kV according to patient  size and/or use of iterative reconstruction technique. COMPARISON:  None Available. FINDINGS: Alignment: Normal Skull base and vertebrae: There is a fracture through the tip of the C5 spinous process posteriorly. No vertebral body fracture. Soft tissues and spinal canal: No prevertebral fluid or swelling. No visible canal hematoma. Disc levels: Diffuse degenerative disc disease with disc space narrowing and spurring. Mild bilateral degenerative facet disease. Upper chest: Biapical scarring. Other: None IMPRESSION: Nondisplaced fracture through the tip of the C5 spinous process posteriorly. Degenerative disc and facet disease. Electronically Signed   By: Charlett Nose M.D.   On: 10/28/2022 19:19   CT Head Wo Contrast  Result Date: 10/28/2022 CLINICAL  DATA:  Head trauma, minor (Age >= 65y).  Fall. EXAM: CT HEAD WITHOUT CONTRAST TECHNIQUE: Contiguous axial images were obtained from the base of the skull through the vertex without intravenous contrast. RADIATION DOSE REDUCTION: This exam was performed according to the departmental dose-optimization program which includes automated exposure control, adjustment of the mA and/or kV according to patient size and/or use of iterative reconstruction technique. COMPARISON:  01/24/2005 FINDINGS: Brain: Mild age related atrophy. No acute intracranial abnormality. Specifically, no hemorrhage, hydrocephalus, mass lesion, acute infarction, or significant intracranial injury. Vascular: No hyperdense vessel or unexpected calcification. Skull: No acute calvarial abnormality. Sinuses/Orbits: No acute findings Other: None IMPRESSION: No acute intracranial abnormality. Electronically Signed   By: Charlett Nose M.D.   On: 10/28/2022 19:17    Assessment & Plan:   Problem List Items Addressed This Visit     Dyslipidemia    On Crestor      Fracture of C5 vertebra, closed (HCC) - Primary    S/p a fall on the pavement face down at the [facility] 10/28/22. No concussion. Soft neck  collar prn C5 nondisplaced spinous process fx RTC 6 wks      Fall against object    S/p a fall on the pavement face down at the [facility] 10/28/22. No concussion. Soft neck collar prn C5 nondisplaced spinous process fx RTC 6 wks      Contusion of nose    S/p a fall on the pavement face down at the [facility] 10/28/22. No concussion. Soft neck collar prn C5 nondisplaced spinous process fx RTC 6 wks         No orders of the defined types were placed in this encounter.     Follow-up: Return in about 6 weeks (around 12/15/2022) for Wellness Exam.  Sonda Primes, MD

## 2022-11-04 ENCOUNTER — Encounter (INDEPENDENT_AMBULATORY_CARE_PROVIDER_SITE_OTHER): Payer: Self-pay

## 2022-11-08 ENCOUNTER — Ambulatory Visit: Payer: Medicare Other | Admitting: Obstetrics and Gynecology

## 2022-11-13 ENCOUNTER — Telehealth: Payer: Medicare Other | Admitting: Family

## 2022-11-13 DIAGNOSIS — R399 Unspecified symptoms and signs involving the genitourinary system: Secondary | ICD-10-CM | POA: Diagnosis not present

## 2022-11-13 DIAGNOSIS — T3695XA Adverse effect of unspecified systemic antibiotic, initial encounter: Secondary | ICD-10-CM

## 2022-11-13 DIAGNOSIS — B379 Candidiasis, unspecified: Secondary | ICD-10-CM | POA: Diagnosis not present

## 2022-11-14 MED ORDER — FLUCONAZOLE 150 MG PO TABS
150.0000 mg | ORAL_TABLET | ORAL | 0 refills | Status: DC | PRN
Start: 2022-11-14 — End: 2023-03-24

## 2022-11-14 MED ORDER — SULFAMETHOXAZOLE-TRIMETHOPRIM 800-160 MG PO TABS
1.0000 | ORAL_TABLET | Freq: Two times a day (BID) | ORAL | 0 refills | Status: DC
Start: 2022-11-14 — End: 2023-01-21

## 2022-11-14 NOTE — Addendum Note (Signed)
Addended by: Jannifer Rodney A on: 11/14/2022 08:50 AM   Modules accepted: Level of Service

## 2022-11-14 NOTE — Progress Notes (Signed)
E-Visit for Urinary Problems  We are sorry that you are not feeling well.  Here is how we plan to help!  Based on what you shared with me it looks like you most likely have a simple urinary tract infection.  A UTI (Urinary Tract Infection) is a bacterial infection of the bladder.  Most cases of urinary tract infections are simple to treat but a key part of your care is to encourage you to drink plenty of fluids and watch your symptoms carefully.  I have prescribed Bactrim DS One tablet twice a day for 7 days.  Your symptoms should gradually improve. Call us if the burning in your urine worsens, you develop worsening fever, back pain or pelvic pain or if your symptoms do not resolve after completing the antibiotic.  I have also sent in diflucan for your infection. This can be caused by the antibiotic you took and causing the white discharge. If your symptoms worsen or do not improve in the next 24-48 hours you need to be seen in person.   Urinary tract infections can be prevented by drinking plenty of water to keep your body hydrated.  Also be sure when you wipe, wipe from front to back and don't hold it in!  If possible, empty your bladder every 4 hours.  HOME CARE Drink plenty of fluids Compete the full course of the antibiotics even if the symptoms resolve Remember, when you need to go.go. Holding in your urine can increase the likelihood of getting a UTI! GET HELP RIGHT AWAY IF: You cannot urinate You get a high fever Worsening back pain occurs You see blood in your urine You feel sick to your stomach or throw up You feel like you are going to pass out  MAKE SURE YOU  Understand these instructions. Will watch your condition. Will get help right away if you are not doing well or get worse.   Thank you for choosing an e-visit.  Your e-visit answers were reviewed by a board certified advanced clinical practitioner to complete your personal care plan. Depending upon the condition,  your plan could have included both over the counter or prescription medications.  Please review your pharmacy choice. Make sure the pharmacy is open so you can pick up prescription now. If there is a problem, you may contact your provider through [employer] and have the prescription routed to another pharmacy.  Your safety is important to [place]. If you have drug allergies check your prescription carefully.   For the next 24 hours you can use MyChart to ask questions about today's visit, request a non-urgent call back, or ask for a work or school excuse. You will get an email in the next two days asking about your experience. I hope that your e-visit has been valuable and will speed your recovery.  Approximately 5 minutes was spent documenting and reviewing patient's chart.

## 2022-12-09 ENCOUNTER — Ambulatory Visit: Payer: Medicare Other | Admitting: Internal Medicine

## 2022-12-14 ENCOUNTER — Telehealth: Payer: Self-pay

## 2022-12-14 ENCOUNTER — Ambulatory Visit (INDEPENDENT_AMBULATORY_CARE_PROVIDER_SITE_OTHER): Payer: Medicare Other

## 2022-12-14 ENCOUNTER — Ambulatory Visit: Payer: Medicare Other | Admitting: Internal Medicine

## 2022-12-14 ENCOUNTER — Encounter: Payer: Self-pay | Admitting: Internal Medicine

## 2022-12-14 VITALS — BP 116/70 | HR 63 | Temp 98.4°F | Ht 61.0 in | Wt 109.0 lb

## 2022-12-14 DIAGNOSIS — S12491D Other nondisplaced fracture of fifth cervical vertebra, subsequent encounter for fracture with routine healing: Secondary | ICD-10-CM | POA: Diagnosis not present

## 2022-12-14 DIAGNOSIS — R002 Palpitations: Secondary | ICD-10-CM

## 2022-12-14 DIAGNOSIS — Z23 Encounter for immunization: Secondary | ICD-10-CM | POA: Diagnosis not present

## 2022-12-14 MED ORDER — METOPROLOL SUCCINATE ER 25 MG PO TB24: 25.0000 mg | ORAL_TABLET | Freq: Every evening | ORAL | 3 refills | Status: DC

## 2022-12-14 MED ORDER — ROSUVASTATIN CALCIUM 5 MG PO TABS: 2.5000 mg | ORAL_TABLET | Freq: Every day | ORAL | 3 refills | Status: DC

## 2022-12-14 NOTE — Assessment & Plan Note (Signed)
Healing well Will get an X ray

## 2022-12-14 NOTE — Progress Notes (Signed)
Subjective:  Patient ID: Joy Thompson, female    DOB: 1940/12/20  Age: 82 y.o. MRN: [id number]  CC: Follow-up (6 WEEK F/U)   HPI [name] presents for a C5 nondisplaced spinous process fx - no pain  [name] presents for a fall on the pavement face down at the [facility] 10/28/22 F/u on C5 nondisplaced spinous process fx No neck pain, full rom, no paresthesias in UE  F/u osteoporosis, palpitations - "heartbeat in the ear" - resolved on Metoprolol    Outpatient Medications Prior to Visit  Medication Sig Dispense Refill   aspirin 81 MG EC tablet Take 81 mg by mouth daily.     calcium-vitamin D (OSCAL WITH D 500-200) 500-200 MG-UNIT per tablet Take 1.5 tablets by mouth.     cephALEXin (KEFLEX) 500 MG capsule Take 1 capsule (500 mg total) by mouth 3 (three) times daily. 15 capsule 0   Cholecalciferol 1000 UNITS tablet Take 1,000 Units by mouth daily.     CRANBERRY PO Take 1 tablet by mouth daily.     denosumab (PROLIA) 60 MG/ML SOLN injection Inject 60 mg into the skin every 6 (six) months. Administer in upper arm, thigh, or abdomen     fluconazole (DIFLUCAN) 150 MG tablet Take 1 tablet (150 mg total) by mouth every three (3) days as needed. 3 tablet 0   Multiple Vitamins-Minerals (MULTIVITAMIN,TX-MINERALS) tablet Take 1 tablet by mouth daily.     sulfamethoxazole-trimethoprim (BACTRIM DS) 800-160 MG tablet Take 1 tablet by mouth 2 (two) times daily. 14 tablet 0   metoprolol succinate (TOPROL-XL) 25 MG 24 hr tablet Take 1 tablet (25 mg total) by mouth at bedtime. 90 tablet 3   rosuvastatin (CRESTOR) 5 MG tablet Take 0.5 tablets (2.5 mg total) by mouth daily. 45 tablet 3   No facility-administered medications prior to visit.    ROS: Review of Systems  Constitutional:  Negative for activity change, appetite change, chills, fatigue and unexpected weight change.  HENT:  Negative for congestion, mouth sores and sinus pressure.   Eyes:  Negative for visual disturbance.   Respiratory:  Negative for cough and chest tightness.   Gastrointestinal:  Negative for abdominal pain and nausea.  Genitourinary:  Negative for difficulty urinating, frequency and vaginal pain.  Musculoskeletal:  Negative for back pain and gait problem.  Skin:  Negative for pallor and rash.  Neurological:  Negative for dizziness, tremors, weakness, numbness and headaches.  Psychiatric/Behavioral:  Negative for confusion and sleep disturbance.     Objective:  BP 116/70 (BP Location: Left Arm, Patient Position: Sitting, Cuff Size: Normal)   Pulse 63   Temp 98.4 F (36.9 C) (Oral)   Ht 5\' 1"  (1.549 m)   Wt 109 lb (49.4 kg)   SpO2 96%   BMI 20.60 kg/m   BP Readings from Last 3 Encounters:  12/14/22 116/70  11/03/22 130/80  10/28/22 (!) 174/82    Wt Readings from Last 3 Encounters:  12/14/22 109 lb (49.4 kg)  11/03/22 105 lb (47.6 kg)  10/28/22 106 lb (48.1 kg)    Physical Exam Constitutional:      General: She is not in acute distress.    Appearance: Normal appearance. She is well-developed.  HENT:     Head: Normocephalic.     Right Ear: External ear normal.     Left Ear: External ear normal.     Nose: Nose normal.  Eyes:     General:  Right eye: No discharge.        Left eye: No discharge.     Conjunctiva/sclera: Conjunctivae normal.     Pupils: Pupils are equal, round, and reactive to light.  Neck:     Thyroid: No thyromegaly.     Vascular: No JVD.     Trachea: No tracheal deviation.  Cardiovascular:     Rate and Rhythm: Normal rate and regular rhythm.     Heart sounds: Normal heart sounds.  Pulmonary:     Effort: No respiratory distress.     Breath sounds: No stridor. No wheezing.  Abdominal:     General: Bowel sounds are normal. There is no distension.     Palpations: Abdomen is soft. There is no mass.     Tenderness: There is no abdominal tenderness. There is no guarding or rebound.  Musculoskeletal:        General: No tenderness.     Cervical  back: Normal range of motion and neck supple. No rigidity.  Lymphadenopathy:     Cervical: No cervical adenopathy.  Skin:    Findings: No erythema or rash.  Neurological:     Cranial Nerves: No cranial nerve deficit.     Motor: No abnormal muscle tone.     Coordination: Coordination normal.     Deep Tendon Reflexes: Reflexes normal.  Psychiatric:        Behavior: Behavior normal.        Thought Content: Thought content normal.        Judgment: Judgment normal.   Neck w/good ROM  Lab Results  Component Value Date   WBC 8.0 11/03/2022   HGB 12.8 11/03/2022   HCT 39.3 11/03/2022   PLT 304.0 11/03/2022   GLUCOSE 99 11/03/2022   CHOL 126 11/03/2022   TRIG 129.0 11/03/2022   HDL 59.50 11/03/2022   LDLCALC 41 11/03/2022   ALT 15 11/03/2022   AST 19 11/03/2022   NA 134 (L) 11/03/2022   K 3.9 11/03/2022   CL 95 (L) 11/03/2022   CREATININE 0.76 11/03/2022   BUN 10 11/03/2022   CO2 32 11/03/2022   TSH 3.61 11/03/2022    CT Maxillofacial Wo Contrast  Result Date: 10/28/2022 CLINICAL DATA:  Facial trauma, blunt.  Fall. EXAM: CT MAXILLOFACIAL WITHOUT CONTRAST TECHNIQUE: Multidetector CT imaging of the maxillofacial structures was performed. Multiplanar CT image reconstructions were also generated. RADIATION DOSE REDUCTION: This exam was performed according to the departmental dose-optimization program which includes automated exposure control, adjustment of the mA and/or kV according to patient size and/or use of iterative reconstruction technique. COMPARISON:  None available FINDINGS: Osseous: No fracture or mandibular dislocation. No destructive process. Orbits: Negative. No traumatic or inflammatory finding. Sinuses: Clear Soft tissues: Negative Limited intracranial: See head CT report IMPRESSION: No facial or orbital fracture. Electronically Signed   By: Charlett Nose M.D.   On: 10/28/2022 19:21   CT Cervical Spine Wo Contrast  Result Date: 10/28/2022 CLINICAL DATA:  Neck trauma (Age  >= 65y) EXAM: CT CERVICAL SPINE WITHOUT CONTRAST TECHNIQUE: Multidetector CT imaging of the cervical spine was performed without intravenous contrast. Multiplanar CT image reconstructions were also generated. RADIATION DOSE REDUCTION: This exam was performed according to the departmental dose-optimization program which includes automated exposure control, adjustment of the mA and/or kV according to patient size and/or use of iterative reconstruction technique. COMPARISON:  None Available. FINDINGS: Alignment: Normal Skull base and vertebrae: There is a fracture through the tip of the C5 spinous process posteriorly. No  vertebral body fracture. Soft tissues and spinal canal: No prevertebral fluid or swelling. No visible canal hematoma. Disc levels: Diffuse degenerative disc disease with disc space narrowing and spurring. Mild bilateral degenerative facet disease. Upper chest: Biapical scarring. Other: None IMPRESSION: Nondisplaced fracture through the tip of the C5 spinous process posteriorly. Degenerative disc and facet disease. Electronically Signed   By: Charlett Nose M.D.   On: 10/28/2022 19:19   CT Head Wo Contrast  Result Date: 10/28/2022 CLINICAL DATA:  Head trauma, minor (Age >= 65y).  Fall. EXAM: CT HEAD WITHOUT CONTRAST TECHNIQUE: Contiguous axial images were obtained from the base of the skull through the vertex without intravenous contrast. RADIATION DOSE REDUCTION: This exam was performed according to the departmental dose-optimization program which includes automated exposure control, adjustment of the mA and/or kV according to patient size and/or use of iterative reconstruction technique. COMPARISON:  01/24/2005 FINDINGS: Brain: Mild age related atrophy. No acute intracranial abnormality. Specifically, no hemorrhage, hydrocephalus, mass lesion, acute infarction, or significant intracranial injury. Vascular: No hyperdense vessel or unexpected calcification. Skull: No acute calvarial abnormality.  Sinuses/Orbits: No acute findings Other: None IMPRESSION: No acute intracranial abnormality. Electronically Signed   By: Charlett Nose M.D.   On: 10/28/2022 19:17    Assessment & Plan:   Problem List Items Addressed This Visit     Palpitations    Much better; "heartbeat in the ear" - resolved on Metoprolol      Fracture of C5 vertebra, closed (HCC) - Primary    Healing well Will get an X ray      Relevant Orders   DG Cervical Spine Complete   Other Visit Diagnoses     Need for influenza vaccination       Relevant Orders   Flu Vaccine Trivalent High Dose (Fluad) (Completed)         Meds ordered this encounter  Medications   metoprolol succinate (TOPROL-XL) 25 MG 24 hr tablet    Sig: Take 1 tablet (25 mg total) by mouth at bedtime.    Dispense:  90 tablet    Refill:  3   rosuvastatin (CRESTOR) 5 MG tablet    Sig: Take 0.5 tablets (2.5 mg total) by mouth daily.    Dispense:  45 tablet    Refill:  3    Please send a replace/new response with 90-Day Supply if appropriate to maximize member benefit. Requesting 1 year supply.      Follow-up: Return in about 4 months (around 04/15/2023) for a follow-up visit.  Sonda Primes, MD

## 2022-12-14 NOTE — Telephone Encounter (Signed)
Pt ready for scheduling for Prolia on or after : 07/08/22   Out-of-pocket cost due at time of visit: $327   Primary: [employer] - Medicare Prolia co-insurance: 20% Admin fee co-insurance: 20%   Secondary: N/A Prolia co-insurance:  Admin fee co-insurance:    Medical Benefit Details: Date Benefits were checked: 06/07/22 Deductible: $190 ($190 met)/ Coinsurance: 20%/ Admin Fee: 20%   Prior Auth: approved PA# [id number] Expiration Date: 06/09/23   Pharmacy benefit: Copay $529.44 If patient wants fill through the pharmacy benefit please send prescription to: [facility], and include estimated need by date in rx notes. Pharmacy will ship medication directly to the office.

## 2022-12-14 NOTE — Assessment & Plan Note (Signed)
Much better; "heartbeat in the ear" - resolved on Metoprolol

## 2022-12-27 ENCOUNTER — Encounter: Payer: Self-pay | Admitting: Internal Medicine

## 2022-12-27 LAB — HM MAMMOGRAPHY

## 2022-12-28 ENCOUNTER — Telehealth: Payer: Self-pay

## 2022-12-28 NOTE — Telephone Encounter (Signed)
Patients husband reached out on behalf of his wife's XRAY results. I do see where his wife had her XRAY done 9/24. At your earliest convenience may you look at her images ?

## 2022-12-29 NOTE — Telephone Encounter (Signed)
The X-ray is not officially read yet, unfortunately.  To me it looks like the fracture has healed.  Thank you

## 2022-12-30 ENCOUNTER — Encounter: Payer: Self-pay | Admitting: Internal Medicine

## 2022-12-30 NOTE — Telephone Encounter (Signed)
Unable to reach patient. LMTRC  

## 2023-01-03 ENCOUNTER — Telehealth: Payer: Self-pay

## 2023-01-03 NOTE — Telephone Encounter (Signed)
Patient is cleared for PROLIA injection on 01/07/2023 per PA information on 12/14/2022.  Mentioned in provider note last on 06/09/2022. Medication is CLINIC supplied. FAOZH:$086

## 2023-01-04 NOTE — Telephone Encounter (Signed)
Patient seen her imaging result via mychart. Patient and her husband gave a verbal understanding.

## 2023-01-07 ENCOUNTER — Ambulatory Visit: Payer: Medicare Other

## 2023-01-07 DIAGNOSIS — M810 Age-related osteoporosis without current pathological fracture: Secondary | ICD-10-CM | POA: Diagnosis not present

## 2023-01-07 MED ORDER — DENOSUMAB 60 MG/ML ~~LOC~~ SOSY
60.0000 mg | PREFILLED_SYRINGE | Freq: Once | SUBCUTANEOUS | Status: AC
Start: 2023-01-07 — End: 2023-01-07
  Administered 2023-01-07: 60 mg via SUBCUTANEOUS

## 2023-01-07 NOTE — Progress Notes (Signed)
Patient presented in office today for her CLINIC SUPPLIED SUB-Q Prolia Injection. Prolia Injection was administered into her Left Arm. Patient tolerated the injection well and the injection site looked fine. Patient advised to report to the office immediately if she notices any adverse reaction.

## 2023-01-21 ENCOUNTER — Telehealth: Payer: Medicare Other | Admitting: Family Medicine

## 2023-01-21 DIAGNOSIS — R399 Unspecified symptoms and signs involving the genitourinary system: Secondary | ICD-10-CM

## 2023-01-21 MED ORDER — SULFAMETHOXAZOLE-TRIMETHOPRIM 800-160 MG PO TABS: 1.0000 | ORAL_TABLET | Freq: Two times a day (BID) | ORAL | 0 refills | Status: DC

## 2023-01-21 NOTE — Patient Instructions (Signed)
Urinary Tract Infection, Adult A urinary tract infection (UTI) is an infection of any part of the urinary tract. The urinary tract includes: The kidneys. The ureters. The bladder. The urethra. These organs make, store, and get rid of pee (urine) in the body. What are the causes? This infection is caused by germs (bacteria) in your genital area. These germs grow and cause swelling (inflammation) of your urinary tract. What increases the risk? The following factors may make you more likely to develop this condition: Using a small, thin tube (catheter) to drain pee. Not being able to control when you pee or poop (incontinence). Being female. If you are female, these things can increase the risk: Using these methods to prevent pregnancy: A medicine that kills sperm (spermicide). A device that blocks sperm (diaphragm). Having low levels of a female hormone (estrogen). Being pregnant. You are more likely to develop this condition if: You have genes that add to your risk. You are sexually active. You take antibiotic medicines. You have trouble peeing because of: A prostate that is bigger than normal, if you are female. A blockage in the part of your body that drains pee from the bladder. A kidney stone. A nerve condition that affects your bladder. Not getting enough to drink. Not peeing often enough. You have other conditions, such as: Diabetes. A weak disease-fighting system (immune system). Sickle cell disease. Gout. Injury of the spine. What are the signs or symptoms? Symptoms of this condition include: Needing to pee right away. Peeing small amounts often. Pain or burning when peeing. Blood in the pee. Pee that smells bad or not like normal. Trouble peeing. Pee that is cloudy. Fluid coming from the vagina, if you are female. Pain in the belly or lower back. Other symptoms include: Vomiting. Not feeling hungry. Feeling mixed up (confused). This may be the first symptom in  older adults. Being tired and grouchy (irritable). A fever. Watery poop (diarrhea). How is this treated? Taking antibiotic medicine. Taking other medicines. Drinking enough water. In some cases, you may need to see a specialist. Follow these instructions at home:  Medicines Take over-the-counter and prescription medicines only as told by your doctor. If you were prescribed an antibiotic medicine, take it as told by your doctor. Do not stop taking it even if you start to feel better. General instructions Make sure you: Pee until your bladder is empty. Do not hold pee for a long time. Empty your bladder after sex. Wipe from front to back after peeing or pooping if you are a female. Use each tissue one time when you wipe. Drink enough fluid to keep your pee pale yellow. Keep all follow-up visits. Contact a doctor if: You do not get better after 1-2 days. Your symptoms go away and then come back. Get help right away if: You have very bad back pain. You have very bad pain in your lower belly. You have a fever. You have chills. You feeling like you will vomit or you vomit. Summary A urinary tract infection (UTI) is an infection of any part of the urinary tract. This condition is caused by germs in your genital area. There are many risk factors for a UTI. Treatment includes antibiotic medicines. Drink enough fluid to keep your pee pale yellow. This information is not intended to replace advice given to you by your health care provider. Make sure you discuss any questions you have with your health care provider. Document Revised: 10/14/2019 Document Reviewed: 10/19/2019 Elsevier Patient Education    2024 Elsevier Inc.  

## 2023-01-21 NOTE — Progress Notes (Signed)
Virtual Visit Consent   Joy Thompson, you are scheduled for a virtual visit with a [facility] provider today. Just as with appointments in the office, your consent must be obtained to participate. Your consent will be active for this visit and any virtual visit you may have with one of our providers in the next 365 days. If you have a MyChart account, a copy of this consent can be sent to you electronically.  As this is a virtual visit, video technology does not allow for your provider to perform a traditional examination. This may limit your provider's ability to fully assess your condition. If your provider identifies any concerns that need to be evaluated in person or the need to arrange testing (such as labs, EKG, etc.), we will make arrangements to do so. Although advances in technology are sophisticated, we cannot ensure that it will always work on either your end or our end. If the connection with a video visit is poor, the visit may have to be switched to a telephone visit. With either a video or telephone visit, we are not always able to ensure that we have a secure connection.  By engaging in this virtual visit, you consent to the provision of healthcare and authorize for your insurance to be billed (if applicable) for the services provided during this visit. Depending on your insurance coverage, you may receive a charge related to this service.  I need to obtain your verbal consent now. Are you willing to proceed with your visit today? MARILUZ CRESPO has provided verbal consent on 01/21/2023 for a virtual visit (video or telephone). Georgana Curio, FNP  Date: 01/21/2023 7:52 PM  Virtual Visit via Video Note   I, Georgana Curio, connected with  Joy Thompson  ([id number], 08-17-1940) on 01/21/23 at  7:45 PM EDT by a video-enabled telemedicine application and verified that I am speaking with the correct person using two identifiers.  Location: Patient: Virtual Visit Location Patient:  Home Provider: Virtual Visit Location Provider: Home Office   I discussed the limitations of evaluation and management by telemedicine and the availability of in person appointments. The patient expressed understanding and agreed to proceed.    History of Present Illness: Joy Thompson is a 82 y.o. who identifies as a female who was assigned female at birth, and is being seen today for cloudy urine with an odor. No fever. In no distress. Her daughter is assisting her with the call. No abd pain. Marland Kitchen  HPI: HPI  Problems:  Patient Active Problem List   Diagnosis Date Noted   Fracture of C5 vertebra, closed (HCC) 11/03/2022   Fall against object 11/03/2022   Contusion of nose 11/03/2022   Palpitations 04/20/2022   Dyslipidemia 12/09/2021   Cervical adenopathy 06/08/2021   Coronary atherosclerosis 12/04/2018   S/P exploratory laparotomy 04/12/2017   Age-related osteoporosis without current pathological fracture 07/30/2016   Dysuria 05/28/2016   Dependent edema 12/18/2015   Ankle swelling 12/01/2015   Laryngitis, acute 11/22/2014   Vaginal atrophy 02/12/2014   Abnormal TSH 08/23/2013   Abnormal CXR 05/24/2013   Cough 05/14/2013   Sacroiliac dysfunction 03/12/2013   Nonallopathic lesion of lumbosacral region 03/12/2013   Trigger finger, acquired 02/22/2013   Murmur 12/27/2011   Contracture of joint of finger 02/23/2011   Well adult exam 08/24/2010   Actinic keratoses 08/24/2010   Elevated BP without diagnosis of hypertension 08/08/2007   NEOPLASM OF UNCERTAIN BEHAVIOR OF SKIN 02/06/2007   Cystitis 02/06/2007  LOW BACK PAIN 02/06/2007   Osteoporosis 01/04/2007    Allergies:  Allergies  Allergen Reactions   Orudis [Ketoprofen] Rash   Medications:  Current Outpatient Medications:    aspirin 81 MG EC tablet, Take 81 mg by mouth daily., Disp: , Rfl:    calcium-vitamin D (OSCAL WITH D 500-200) 500-200 MG-UNIT per tablet, Take 1.5 tablets by mouth., Disp: , Rfl:    cephALEXin  (KEFLEX) 500 MG capsule, Take 1 capsule (500 mg total) by mouth 3 (three) times daily., Disp: 15 capsule, Rfl: 0   Cholecalciferol 1000 UNITS tablet, Take 1,000 Units by mouth daily., Disp: , Rfl:    CRANBERRY PO, Take 1 tablet by mouth daily., Disp: , Rfl:    denosumab (PROLIA) 60 MG/ML SOLN injection, Inject 60 mg into the skin every 6 (six) months. Administer in upper arm, thigh, or abdomen, Disp: , Rfl:    fluconazole (DIFLUCAN) 150 MG tablet, Take 1 tablet (150 mg total) by mouth every three (3) days as needed., Disp: 3 tablet, Rfl: 0   metoprolol succinate (TOPROL-XL) 25 MG 24 hr tablet, Take 1 tablet (25 mg total) by mouth at bedtime., Disp: 90 tablet, Rfl: 3   Multiple Vitamins-Minerals (MULTIVITAMIN,TX-MINERALS) tablet, Take 1 tablet by mouth daily., Disp: , Rfl:    rosuvastatin (CRESTOR) 5 MG tablet, Take 0.5 tablets (2.5 mg total) by mouth daily., Disp: 45 tablet, Rfl: 3   sulfamethoxazole-trimethoprim (BACTRIM DS) 800-160 MG tablet, Take 1 tablet by mouth 2 (two) times daily., Disp: 14 tablet, Rfl: 0  Observations/Objective: Patient is well-developed, well-nourished in no acute distress.  Resting comfortably  at home.  Head is normocephalic, atraumatic.  No labored breathing.  Speech is clear and coherent with logical content.  Patient is alert and oriented at baseline.    Assessment and Plan: 1. UTI symptoms - sulfamethoxazole-trimethoprim (BACTRIM DS) 800-160 MG tablet; Take 1 tablet by mouth 2 (two) times daily.  Dispense: 14 tablet; Refill: 0  Increase fluids, preventative measures discussed, UC if sx worsen.   Follow Up Instructions: I discussed the assessment and treatment plan with the patient. The patient was provided an opportunity to ask questions and all were answered. The patient agreed with the plan and demonstrated an understanding of the instructions.  A copy of instructions were sent to the patient via MyChart unless otherwise noted below.     The patient was  advised to call back or seek an in-person evaluation if the symptoms worsen or if the condition fails to improve as anticipated.    Georgana Curio, FNP

## 2023-03-17 ENCOUNTER — Ambulatory Visit: Payer: Medicare Other | Admitting: Obstetrics and Gynecology

## 2023-03-19 ENCOUNTER — Telehealth: Payer: Medicare Other | Admitting: Family Medicine

## 2023-03-19 DIAGNOSIS — R399 Unspecified symptoms and signs involving the genitourinary system: Secondary | ICD-10-CM | POA: Diagnosis not present

## 2023-03-19 DIAGNOSIS — B3731 Acute candidiasis of vulva and vagina: Secondary | ICD-10-CM | POA: Diagnosis not present

## 2023-03-19 DIAGNOSIS — B379 Candidiasis, unspecified: Secondary | ICD-10-CM

## 2023-03-19 DIAGNOSIS — T3695XA Adverse effect of unspecified systemic antibiotic, initial encounter: Secondary | ICD-10-CM

## 2023-03-19 MED ORDER — CEPHALEXIN 500 MG PO CAPS: 500.0000 mg | ORAL_CAPSULE | Freq: Two times a day (BID) | ORAL | 0 refills | Status: AC

## 2023-03-19 MED ORDER — FLUCONAZOLE 150 MG PO TABS: 150.0000 mg | ORAL_TABLET | Freq: Once | ORAL | 0 refills | Status: AC

## 2023-03-19 NOTE — Progress Notes (Signed)
E-Visit for Urinary Problems  We are sorry that you are not feeling well.  Here is how we plan to help!  Based on what you shared with me it looks like you most likely have a simple urinary tract infection.  A UTI (Urinary Tract Infection) is a bacterial infection of the bladder.  Most cases of urinary tract infections are simple to treat but a key part of your care is to encourage you to drink plenty of fluids and watch your symptoms carefully.  I have prescribed Keflex 500 mg twice a day for 7 days.  Your symptoms should gradually improve. Call us if the burning in your urine worsens, you develop worsening fever, back pain or pelvic pain or if your symptoms do not resolve after completing the antibiotic. I will also send diflucan.  Urinary tract infections can be prevented by drinking plenty of water to keep your body hydrated.  Also be sure when you wipe, wipe from front to back and don't hold it in!  If possible, empty your bladder every 4 hours.  HOME CARE Drink plenty of fluids Compete the full course of the antibiotics even if the symptoms resolve Remember, when you need to go.go. Holding in your urine can increase the likelihood of getting a UTI! GET HELP RIGHT AWAY IF: You cannot urinate You get a high fever Worsening back pain occurs You see blood in your urine You feel sick to your stomach or throw up You feel like you are going to pass out  MAKE SURE YOU  Understand these instructions. Will watch your condition. Will get help right away if you are not doing well or get worse.   Thank you for choosing an e-visit.  Your e-visit answers were reviewed by a board certified advanced clinical practitioner to complete your personal care plan. Depending upon the condition, your plan could have included both over the counter or prescription medications.  Please review your pharmacy choice. Make sure the pharmacy is open so you can pick up prescription now. If there is a problem,  you may contact your provider through [employer] and have the prescription routed to another pharmacy.  Your safety is important to [place]. If you have drug allergies check your prescription carefully.   For the next 24 hours you can use MyChart to ask questions about today's visit, request a non-urgent call back, or ask for a work or school excuse. You will get an email in the next two days asking about your experience. I hope that your e-visit has been valuable and will speed your recovery.    have provided 5 minutes of non face to face time during this encounter for chart review and documentation.

## 2023-03-24 ENCOUNTER — Encounter: Payer: Self-pay | Admitting: Obstetrics and Gynecology
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
## 2023-03-28 ENCOUNTER — Ambulatory Visit: Payer: Medicare Other | Admitting: Obstetrics and Gynecology

## 2023-04-18 ENCOUNTER — Ambulatory Visit: Payer: Medicare Other | Admitting: Internal Medicine

## 2023-04-18 ENCOUNTER — Telehealth: Payer: Self-pay

## 2023-04-18 ENCOUNTER — Encounter: Payer: Self-pay | Admitting: Internal Medicine

## 2023-04-18 VITALS — BP 120/80 | HR 85 | Temp 98.6°F | Ht 61.0 in | Wt 105.0 lb

## 2023-04-18 DIAGNOSIS — N3000 Acute cystitis without hematuria: Secondary | ICD-10-CM | POA: Diagnosis not present

## 2023-04-18 DIAGNOSIS — S12491D Other nondisplaced fracture of fifth cervical vertebra, subsequent encounter for fracture with routine healing: Secondary | ICD-10-CM | POA: Diagnosis not present

## 2023-04-18 DIAGNOSIS — E785 Hyperlipidemia, unspecified: Secondary | ICD-10-CM

## 2023-04-18 DIAGNOSIS — M810 Age-related osteoporosis without current pathological fracture: Secondary | ICD-10-CM | POA: Diagnosis not present

## 2023-04-18 DIAGNOSIS — I2583 Coronary atherosclerosis due to lipid rich plaque: Secondary | ICD-10-CM

## 2023-04-18 LAB — URINALYSIS
Bilirubin Urine: NEGATIVE
Hgb urine dipstick: NEGATIVE
Ketones, ur: NEGATIVE
Leukocytes,Ua: NEGATIVE
Nitrite: NEGATIVE
Specific Gravity, Urine: <=1.005 — AB (ref 1.000–1.030)
Total Protein, Urine: NEGATIVE
Urine Glucose: NEGATIVE
Urobilinogen, UA: 0.2 (ref 0.0–1.0)
pH: 7 (ref 5.0–8.0)

## 2023-04-18 LAB — COMPREHENSIVE METABOLIC PANEL
ALT: 20 U/L (ref 0–35)
AST: 23 U/L (ref 0–37)
Albumin: 4.4 g/dL (ref 3.5–5.2)
Alkaline Phosphatase: 64 U/L (ref 39–117)
BUN: 11 mg/dL (ref 6–23)
CO2: 30 meq/L (ref 19–32)
Calcium: 9 mg/dL (ref 8.4–10.5)
Chloride: 99 meq/L (ref 96–112)
Creatinine, Ser: 0.71 mg/dL (ref 0.40–1.20)
GFR: 79.05 mL/min (ref >60.00)
Glucose, Bld: 100 mg/dL — ABNORMAL HIGH (ref 70–99)
Potassium: 4.2 meq/L (ref 3.5–5.1)
Sodium: 137 meq/L (ref 135–145)
Total Bilirubin: 0.5 mg/dL (ref 0.2–1.2)
Total Protein: 7.1 g/dL (ref 6.0–8.3)

## 2023-04-18 MED ORDER — METOPROLOL SUCCINATE ER 25 MG PO TB24: 25.0000 mg | ORAL_TABLET | Freq: Every evening | ORAL | 3 refills | Status: DC

## 2023-04-18 MED ORDER — ROSUVASTATIN CALCIUM 5 MG PO TABS: 2.5000 mg | ORAL_TABLET | Freq: Every day | ORAL | 3 refills | Status: DC

## 2023-04-18 NOTE — Telephone Encounter (Signed)
Pharmacy Patient Advocate Encounter   Received notification from Pt Calls Messages that prior authorization for Prolia is required/requested.   Insurance verification completed.   The patient is insured through  [employer]  .   Per test claim: PA required; PA submitted to above mentioned insurance via [facility] portal Key/confirmation #/EOC [id number] Status is pending

## 2023-04-18 NOTE — Progress Notes (Signed)
Subjective:  Patient ID: Joy Thompson, female    DOB: [birth year]  Age: 83 y.o. MRN: [id number]  CC: Medical Management of Chronic Issues (4 mnth f/u)   HPI Joy Thompson presents for HTN, dyslipidemia, cystitis  Outpatient Medications Prior to Visit  Medication Sig Dispense Refill   aspirin 81 MG EC tablet Take 81 mg by mouth daily.     calcium-vitamin D (OSCAL WITH D 500-200) 500-200 MG-UNIT per tablet Take 1.5 tablets by mouth.     Cholecalciferol 1000 UNITS tablet Take 1,000 Units by mouth daily.     CRANBERRY PO Take 1 tablet by mouth daily.     denosumab (PROLIA) 60 MG/ML SOLN injection Inject 60 mg into the skin every 6 (six) months. Administer in upper arm, thigh, or abdomen     Multiple Vitamins-Minerals (MULTIVITAMIN,TX-MINERALS) tablet Take 1 tablet by mouth daily.     metoprolol succinate (TOPROL-XL) 25 MG 24 hr tablet Take 1 tablet (25 mg total) by mouth at bedtime. 90 tablet 3   rosuvastatin (CRESTOR) 5 MG tablet Take 0.5 tablets (2.5 mg total) by mouth daily. 45 tablet 3   No facility-administered medications prior to visit.    ROS: Review of Systems  Constitutional:  Negative for activity change, appetite change, chills, fatigue and unexpected weight change.  HENT:  Negative for congestion, mouth sores and sinus pressure.   Eyes:  Negative for visual disturbance.  Respiratory:  Negative for cough and chest tightness.   Gastrointestinal:  Negative for abdominal pain and nausea.  Genitourinary:  Negative for difficulty urinating, frequency and vaginal pain.  Musculoskeletal:  Negative for back pain and gait problem.  Skin:  Negative for pallor and rash.  Neurological:  Negative for dizziness, tremors, weakness, numbness and headaches.  Psychiatric/Behavioral:  Negative for confusion and sleep disturbance.     Objective:  BP 120/80 (BP Location: Left Arm, Patient Position: Sitting, Cuff Size: Normal)   Pulse 85   Temp 98.6 F (37 C) (Oral)   Ht 5\' 1"   (1.549 m)   Wt 105 lb (47.6 kg)   SpO2 92%   BMI 19.84 kg/m   BP Readings from Last 3 Encounters:  04/18/23 120/80  12/14/22 116/70  11/03/22 130/80    Wt Readings from Last 3 Encounters:  04/18/23 105 lb (47.6 kg)  12/14/22 109 lb (49.4 kg)  11/03/22 105 lb (47.6 kg)    Physical Exam Constitutional:      General: She is not in acute distress.    Appearance: Normal appearance. She is well-developed.  HENT:     Head: Normocephalic.     Right Ear: External ear normal.     Left Ear: External ear normal.     Nose: Nose normal.  Eyes:     General:        Right eye: No discharge.        Left eye: No discharge.     Conjunctiva/sclera: Conjunctivae normal.     Pupils: Pupils are equal, round, and reactive to light.  Neck:     Thyroid: No thyromegaly.     Vascular: No JVD.     Trachea: No tracheal deviation.  Cardiovascular:     Rate and Rhythm: Normal rate and regular rhythm.     Heart sounds: Normal heart sounds.  Pulmonary:     Effort: No respiratory distress.     Breath sounds: No stridor. No wheezing.  Abdominal:     General: Bowel sounds are normal. There is no  distension.     Palpations: Abdomen is soft. There is no mass.     Tenderness: There is no abdominal tenderness. There is no guarding or rebound.  Musculoskeletal:        General: No tenderness.     Cervical back: Normal range of motion and neck supple. No rigidity.  Lymphadenopathy:     Cervical: No cervical adenopathy.  Skin:    Findings: No erythema or rash.  Neurological:     Cranial Nerves: No cranial nerve deficit.     Motor: No abnormal muscle tone.     Coordination: Coordination normal.     Deep Tendon Reflexes: Reflexes normal.  Psychiatric:        Behavior: Behavior normal.        Thought Content: Thought content normal.        Judgment: Judgment normal.     Lab Results  Component Value Date   WBC 8.0 11/03/2022   HGB 12.8 11/03/2022   HCT 39.3 11/03/2022   PLT 304.0 11/03/2022    GLUCOSE 99 11/03/2022   CHOL 126 11/03/2022   TRIG 129.0 11/03/2022   HDL 59.50 11/03/2022   LDLCALC 41 11/03/2022   ALT 15 11/03/2022   AST 19 11/03/2022   NA 134 (L) 11/03/2022   K 3.9 11/03/2022   CL 95 (L) 11/03/2022   CREATININE 0.76 11/03/2022   BUN 10 11/03/2022   CO2 32 11/03/2022   TSH 3.61 11/03/2022    CT Maxillofacial Wo Contrast Result Date: 10/28/2022 CLINICAL DATA:  Facial trauma, blunt.  Fall. EXAM: CT MAXILLOFACIAL WITHOUT CONTRAST TECHNIQUE: Multidetector CT imaging of the maxillofacial structures was performed. Multiplanar CT image reconstructions were also generated. RADIATION DOSE REDUCTION: This exam was performed according to the departmental dose-optimization program which includes automated exposure control, adjustment of the mA and/or kV according to patient size and/or use of iterative reconstruction technique. COMPARISON:  None available FINDINGS: Osseous: No fracture or mandibular dislocation. No destructive process. Orbits: Negative. No traumatic or inflammatory finding. Sinuses: Clear Soft tissues: Negative Limited intracranial: See head CT report IMPRESSION: No facial or orbital fracture. Electronically Signed   By: Charlett Nose M.D.   On: 10/28/2022 19:21   CT Cervical Spine Wo Contrast Result Date: 10/28/2022 CLINICAL DATA:  Neck trauma (Age >= 65y) EXAM: CT CERVICAL SPINE WITHOUT CONTRAST TECHNIQUE: Multidetector CT imaging of the cervical spine was performed without intravenous contrast. Multiplanar CT image reconstructions were also generated. RADIATION DOSE REDUCTION: This exam was performed according to the departmental dose-optimization program which includes automated exposure control, adjustment of the mA and/or kV according to patient size and/or use of iterative reconstruction technique. COMPARISON:  None Available. FINDINGS: Alignment: Normal Skull base and vertebrae: There is a fracture through the tip of the C5 spinous process posteriorly. No vertebral  body fracture. Soft tissues and spinal canal: No prevertebral fluid or swelling. No visible canal hematoma. Disc levels: Diffuse degenerative disc disease with disc space narrowing and spurring. Mild bilateral degenerative facet disease. Upper chest: Biapical scarring. Other: None IMPRESSION: Nondisplaced fracture through the tip of the C5 spinous process posteriorly. Degenerative disc and facet disease. Electronically Signed   By: Charlett Nose M.D.   On: 10/28/2022 19:19   CT Head Wo Contrast Result Date: 10/28/2022 CLINICAL DATA:  Head trauma, minor (Age >= 65y).  Fall. EXAM: CT HEAD WITHOUT CONTRAST TECHNIQUE: Contiguous axial images were obtained from the base of the skull through the vertex without intravenous contrast. RADIATION DOSE REDUCTION: This exam was  performed according to the departmental dose-optimization program which includes automated exposure control, adjustment of the mA and/or kV according to patient size and/or use of iterative reconstruction technique. COMPARISON:  01/24/2005 FINDINGS: Brain: Mild age related atrophy. No acute intracranial abnormality. Specifically, no hemorrhage, hydrocephalus, mass lesion, acute infarction, or significant intracranial injury. Vascular: No hyperdense vessel or unexpected calcification. Skull: No acute calvarial abnormality. Sinuses/Orbits: No acute findings Other: None IMPRESSION: No acute intracranial abnormality. Electronically Signed   By: Charlett Nose M.D.   On: 10/28/2022 19:17    Assessment & Plan:   Problem List Items Addressed This Visit     Osteoporosis   Cont Prolia Vit D DEXA ordered      Coronary atherosclerosis - Primary   Coronary calcium score of 217 - 2020 Cont on Crestor, ASA BP nl at home      Relevant Medications   metoprolol succinate (TOPROL-XL) 25 MG 24 hr tablet   rosuvastatin (CRESTOR) 5 MG tablet   Other Relevant Orders   Comprehensive metabolic panel   Dyslipidemia   On Crestor      Relevant Medications    rosuvastatin (CRESTOR) 5 MG tablet   Other Relevant Orders   Comprehensive metabolic panel   Fracture of C5 vertebra, closed (HCC)   Healed well       Other Visit Diagnoses       Acute cystitis without hematuria       Relevant Orders   CULTURE, URINE COMPREHENSIVE   Urinalysis         Meds ordered this encounter  Medications   metoprolol succinate (TOPROL-XL) 25 MG 24 hr tablet    Sig: Take 1 tablet (25 mg total) by mouth at bedtime.    Dispense:  90 tablet    Refill:  3   rosuvastatin (CRESTOR) 5 MG tablet    Sig: Take 0.5 tablets (2.5 mg total) by mouth daily.    Dispense:  45 tablet    Refill:  3    Please send a replace/new response with 90-Day Supply if appropriate to maximize member benefit. Requesting 1 year supply.      Follow-up: Return in about 6 months (around 10/16/2023) for Wellness Exam.  Sonda Primes, MD

## 2023-04-18 NOTE — Telephone Encounter (Signed)
Pt is due for her next Prolia injection in April... Can a PA for 2025 be ran and updated to pts chart please?

## 2023-04-18 NOTE — Assessment & Plan Note (Signed)
On Crestor

## 2023-04-18 NOTE — Telephone Encounter (Signed)
Prolia VOB initiated via [email or web address]  Next Prolia inj DUE:  April 2025

## 2023-04-18 NOTE — Telephone Encounter (Signed)
Pharmacy Patient Advocate Encounter  Received notification from  [facility]  that Prior Authorization for Prolia has been APPROVED from 04/18/23 to 04/17/24     PA #/Case ID/Reference #: [id number]

## 2023-04-18 NOTE — Assessment & Plan Note (Signed)
Cont Prolia Vit D DEXA ordered

## 2023-04-18 NOTE — Assessment & Plan Note (Signed)
Healed well.

## 2023-04-18 NOTE — Assessment & Plan Note (Signed)
Coronary calcium score of 217 - 2020 Cont on Crestor, ASA BP nl at home

## 2023-04-19 ENCOUNTER — Encounter: Payer: Self-pay | Admitting: Internal Medicine

## 2023-04-20 LAB — CULTURE, URINE COMPREHENSIVE: RESULT:: NO GROWTH

## 2023-04-21 ENCOUNTER — Other Ambulatory Visit (HOSPITAL_COMMUNITY): Payer: Self-pay

## 2023-04-21 NOTE — Telephone Encounter (Signed)
Pt ready for scheduling for Prolia on or after : 07/08/23  Out-of-pocket cost due at time of visit: $515 (including deductible)  Number of injection/visits approved: 2  Primary: [employer] - Medicare Prolia co-insurance: 20% Admin fee co-insurance: 20%  Secondary: N/A Prolia co-insurance:  Admin fee co-insurance:   Medical Benefit Details: Date Benefits were checked: 04/18/23 Deductible: $190/ Coinsurance: 20%/ Admin Fee: 20%  Prior Auth: Approved PA# [id number]  Expiration Date: 04/18/23 to 04/17/24  # of doses approved: 2  Pharmacy benefit: Copay $740 If patient wants fill through the pharmacy benefit please send prescription to:  [facility] , and include estimated need by date in rx notes. Pharmacy will ship medication directly to the office.  Patient not eligible for Prolia Copay Card. Copay Card can make patient's cost as little as $25. Link to apply: [email or web address]://[email or web address]  ** This summary of benefits is an estimation of the patient's out-of-pocket cost. Exact cost may very based on individual plan coverage.

## 2023-04-21 NOTE — Telephone Encounter (Signed)
Marland Kitchen

## 2023-06-07 ENCOUNTER — Telehealth: Payer: Self-pay

## 2023-06-07 ENCOUNTER — Other Ambulatory Visit: Payer: Self-pay

## 2023-06-07 DIAGNOSIS — M810 Age-related osteoporosis without current pathological fracture: Secondary | ICD-10-CM

## 2023-06-07 MED ORDER — DENOSUMAB 60 MG/ML ~~LOC~~ SOSY
60.0000 mg | PREFILLED_SYRINGE | Freq: Once | SUBCUTANEOUS | Status: AC
Start: 2023-07-08 — End: 2023-07-11
  Administered 2023-07-11: 60 mg via SUBCUTANEOUS

## 2023-06-07 NOTE — Telephone Encounter (Signed)
 Patient is cleared for prolia injection on 07/08/23 per PA information on 04/18/23.  Mentioned in provider note last on 04/18/23. Medication is clinic supplied. OACZY:$606

## 2023-06-07 NOTE — Telephone Encounter (Signed)
 Called and left patient a voice message for call back. This is in regards to informing her of the prolia OOP cost for the first injection as well as getting her scheduled for this injection.

## 2023-06-07 NOTE — Telephone Encounter (Signed)
 Patient returned Zack's call and would like a call back at [phone].
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
## 2023-06-28 ENCOUNTER — Ambulatory Visit (INDEPENDENT_AMBULATORY_CARE_PROVIDER_SITE_OTHER)

## 2023-06-28 VITALS — Ht 61.0 in | Wt 105.0 lb

## 2023-06-28 DIAGNOSIS — Z0000 Encounter for general adult medical examination without abnormal findings: Secondary | ICD-10-CM
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
## 2023-06-28 NOTE — Patient Instructions (Signed)
 Joy Thompson , Thank you for taking time to come for your Medicare Wellness Visit. I appreciate your ongoing commitment to your health goals. Please review the following plan we discussed and let me know if I can assist you in the future.   Referrals/Orders/Follow-Ups/Clinician Recommendations: It was a pleasure speaking with you this morning.  You keep up the good work.    This is a list of the screening recommended for you and due dates:  Health Maintenance  Topic Date Due   COVID-19 Vaccine (4 - 2024-25 season) 11/21/2022   Flu Shot  10/21/2023   Medicare Annual Wellness Visit  06/27/2024   DTaP/Tdap/Td vaccine (3 - Tdap) 08/27/2024   Pneumonia Vaccine  Completed   DEXA scan (bone density measurement)  Completed   Zoster (Shingles) Vaccine  Completed   HPV Vaccine  Aged Out   Hepatitis C Screening  Discontinued    Advanced directives: (Copy Requested) Please bring a copy of your health care power of attorney and living will to the office to be added to your chart at your convenience. You can mail to [facility] [address] [place], [place] [address] or email to [email or web address]  Next Medicare Annual Wellness Visit scheduled for next year: Yes

## 2023-07-05 ENCOUNTER — Other Ambulatory Visit (HOSPITAL_COMMUNITY)
Admission: RE | Admit: 2023-07-05 | Discharge: 2023-07-05 | Disposition: A | Discharge status: Home / Self Care | Source: Ambulatory Visit | Attending: Obstetrics and Gynecology | Admitting: Obstetrics and Gynecology

## 2023-07-05 ENCOUNTER — Encounter: Payer: Self-pay | Admitting: Obstetrics and Gynecology

## 2023-07-05 ENCOUNTER — Ambulatory Visit: Payer: Medicare Other | Admitting: Obstetrics and Gynecology

## 2023-07-05 VITALS — BP 112/70 | HR 75 | Ht 63.25 in | Wt 106.0 lb

## 2023-07-05 DIAGNOSIS — Z01419 Encounter for gynecological examination (general) (routine) without abnormal findings: Secondary | ICD-10-CM | POA: Diagnosis present

## 2023-07-05 DIAGNOSIS — Z1151 Encounter for screening for human papillomavirus (HPV): Secondary | ICD-10-CM | POA: Diagnosis not present

## 2023-07-05 DIAGNOSIS — Z124 Encounter for screening for malignant neoplasm of cervix: Secondary | ICD-10-CM | POA: Insufficient documentation

## 2023-07-05 DIAGNOSIS — M810 Age-related osteoporosis without current pathological fracture: Secondary | ICD-10-CM

## 2023-07-07 LAB — CYTOLOGY - PAP
Comment: NEGATIVE
Diagnosis: NEGATIVE
High risk HPV: NEGATIVE

## 2023-07-08 ENCOUNTER — Encounter: Payer: Self-pay | Admitting: Obstetrics and Gynecology

## 2023-07-11 ENCOUNTER — Ambulatory Visit

## 2023-07-11 DIAGNOSIS — M810 Age-related osteoporosis without current pathological fracture: Secondary | ICD-10-CM | POA: Diagnosis not present

## 2023-07-11 MED ORDER — DENOSUMAB 60 MG/ML ~~LOC~~ SOSY
60.0000 mg | PREFILLED_SYRINGE | Freq: Once | SUBCUTANEOUS | Status: AC
  Administered 2024-01-16: 60 mg via SUBCUTANEOUS

## 2023-07-11 NOTE — Progress Notes (Addendum)
 Patient visits today to receive her PROLIA  injection/vaccine. Patient was informed and tolerated well. Patient was notified to reach out to us  if needed.   Patient is cleared for PROLIA  injection on 06/2023 per PA information on 04/18/2023.  Mentioned in provider note last on 12/14/2022. Medication is CLINIC supplied. ZOXWR:$604   Medical screening examination/treatment/procedure(s) were performed by non-physician practitioner and as supervising physician I was immediately available for consultation/collaboration.  I agree with above. Adelaide Holy, MD

## 2023-08-18 ENCOUNTER — Telehealth: Payer: Self-pay

## 2023-08-18 NOTE — Telephone Encounter (Signed)
 Spoke with patient, scheduled an appt.    Copied from CRM [phone]. Topic: General - Other >> Aug 18, 2023  8:05 AM Annelle Kiel wrote: Reason for CRM: patient is needing a call back regarding a appt early part of July or August

## 2023-10-18 ENCOUNTER — Encounter: Payer: Medicare Other | Admitting: Internal Medicine

## 2023-11-09 ENCOUNTER — Encounter: Payer: Self-pay | Admitting: Internal Medicine

## 2023-11-09 ENCOUNTER — Ambulatory Visit (INDEPENDENT_AMBULATORY_CARE_PROVIDER_SITE_OTHER): Admitting: Internal Medicine

## 2023-11-09 VITALS — BP 118/80 | HR 87 | Temp 98.3°F | Ht 63.25 in | Wt 106.0 lb

## 2023-11-09 DIAGNOSIS — Z0000 Encounter for general adult medical examination without abnormal findings: Secondary | ICD-10-CM

## 2023-11-09 DIAGNOSIS — E785 Hyperlipidemia, unspecified: Secondary | ICD-10-CM | POA: Diagnosis not present

## 2023-11-09 DIAGNOSIS — I2583 Coronary atherosclerosis due to lipid rich plaque: Secondary | ICD-10-CM | POA: Diagnosis not present

## 2023-11-09 DIAGNOSIS — Z1211 Encounter for screening for malignant neoplasm of colon: Secondary | ICD-10-CM | POA: Diagnosis not present

## 2023-11-09 LAB — COMPREHENSIVE METABOLIC PANEL WITH GFR
ALT: 16 U/L (ref 0–35)
AST: 22 U/L (ref 0–37)
Albumin: 4.2 g/dL (ref 3.5–5.2)
Alkaline Phosphatase: 63 U/L (ref 39–117)
BUN: 8 mg/dL (ref 6–23)
CO2: 32 meq/L (ref 19–32)
Calcium: 9.8 mg/dL (ref 8.4–10.5)
Chloride: 97 meq/L (ref 96–112)
Creatinine, Ser: 0.71 mg/dL (ref 0.40–1.20)
GFR: 78.74 mL/min (ref >60.00)
Glucose, Bld: 97 mg/dL (ref 70–99)
Potassium: 4 meq/L (ref 3.5–5.1)
Sodium: 139 meq/L (ref 135–145)
Total Bilirubin: 0.5 mg/dL (ref 0.2–1.2)
Total Protein: 7.1 g/dL (ref 6.0–8.3)

## 2023-11-09 LAB — CBC WITH DIFFERENTIAL/PLATELET
Basophils Absolute: 0 K/uL (ref 0.0–0.1)
Basophils Relative: 0.8 % (ref 0.0–3.0)
Eosinophils Absolute: 0.3 K/uL (ref 0.0–0.7)
Eosinophils Relative: 6.4 % — ABNORMAL HIGH (ref 0.0–5.0)
HCT: 41.6 % (ref 36.0–46.0)
Hemoglobin: 13.6 g/dL (ref 12.0–15.0)
Lymphocytes Relative: 18.5 % (ref 12.0–46.0)
Lymphs Abs: 1 K/uL (ref 0.7–4.0)
MCHC: 32.8 g/dL (ref 30.0–36.0)
MCV: 89 fl (ref 78.0–100.0)
Monocytes Absolute: 0.6 K/uL (ref 0.1–1.0)
Monocytes Relative: 11.5 % (ref 3.0–12.0)
Neutro Abs: 3.4 K/uL (ref 1.4–7.7)
Neutrophils Relative %: 62.8 % (ref 43.0–77.0)
Platelets: 276 K/uL (ref 150.0–400.0)
RBC: 4.68 Mil/uL (ref 3.87–5.11)
RDW: 13 % (ref 11.5–15.5)
WBC: 5.4 K/uL (ref 4.0–10.5)

## 2023-11-09 LAB — URINALYSIS, ROUTINE W REFLEX MICROSCOPIC
Bilirubin Urine: NEGATIVE
Hgb urine dipstick: NEGATIVE
Ketones, ur: NEGATIVE
Nitrite: NEGATIVE
RBC / HPF: NONE SEEN (ref 0–?)
Specific Gravity, Urine: <=1.005 — AB (ref 1.000–1.030)
Total Protein, Urine: NEGATIVE
Urine Glucose: NEGATIVE
Urobilinogen, UA: 0.2 (ref 0.0–1.0)
pH: 7.5 (ref 5.0–8.0)

## 2023-11-09 LAB — LIPID PANEL
Cholesterol: 123 mg/dL (ref 0–200)
HDL: 67.5 mg/dL (ref >39.00)
LDL Cholesterol: 39 mg/dL (ref 0–99)
NonHDL: 55.37
Total CHOL/HDL Ratio: 2
Triglycerides: 82 mg/dL (ref 0.0–149.0)
VLDL: 16.4 mg/dL (ref 0.0–40.0)

## 2023-11-09 LAB — TSH: TSH: 4.63 u[IU]/mL (ref 0.35–5.50)

## 2023-11-09 MED ORDER — METOPROLOL SUCCINATE ER 25 MG PO TB24: 25.0000 mg | ORAL_TABLET | Freq: Every evening | ORAL | 3 refills | Status: AC

## 2023-11-09 MED ORDER — ROSUVASTATIN CALCIUM 5 MG PO TABS: 2.5000 mg | ORAL_TABLET | Freq: Every day | ORAL | 3 refills | Status: DC

## 2023-11-09 NOTE — Progress Notes (Signed)
 Subjective:  Patient ID: Joy Thompson, female    DOB: 1941/01/24  Age: 83 y.o. MRN: [id number]  CC: No chief complaint on file.   HPI Joy Thompson presents for a well exam  Outpatient Medications Prior to Visit  Medication Sig Dispense Refill   aspirin 81 MG EC tablet Take 81 mg by mouth daily.     calcium -vitamin D  (OSCAL WITH D 500-200) 500-200 MG-UNIT per tablet Take 1.5 tablets by mouth.     Cholecalciferol 1000 UNITS tablet Take 1,000 Units by mouth daily.     CRANBERRY PO Take 1 tablet by mouth daily.     denosumab  (PROLIA ) 60 MG/ML SOLN injection Inject 60 mg into the skin every 6 (six) months. Administer in upper arm, thigh, or abdomen     Multiple Vitamins-Minerals (MULTIVITAMIN,TX-MINERALS) tablet Take 1 tablet by mouth daily.     metoprolol  succinate (TOPROL -XL) 25 MG 24 hr tablet Take 1 tablet (25 mg total) by mouth at bedtime. 90 tablet 3   rosuvastatin  (CRESTOR ) 5 MG tablet Take 0.5 tablets (2.5 mg total) by mouth daily. 45 tablet 3   Facility-Administered Medications Prior to Visit  Medication Dose Route Frequency Provider Last Rate Last Admin   [START ON 01/10/2024] denosumab  (PROLIA ) injection 60 mg  60 mg Subcutaneous Once Eulonda Andalon V, MD        ROS: Review of Systems  Constitutional:  Negative for activity change, appetite change, chills, fatigue and unexpected weight change.  HENT:  Negative for congestion, mouth sores and sinus pressure.   Eyes:  Negative for visual disturbance.  Respiratory:  Negative for cough and chest tightness.   Gastrointestinal:  Negative for abdominal pain and nausea.  Genitourinary:  Negative for difficulty urinating, frequency and vaginal pain.  Musculoskeletal:  Negative for back pain and gait problem.  Skin:  Negative for pallor and rash.  Neurological:  Negative for dizziness, tremors, weakness, numbness and headaches.  Psychiatric/Behavioral:  Negative for confusion and sleep disturbance.     Objective:  BP  118/80   Pulse 87   Temp 98.3 F (36.8 C) (Oral)   Ht 5' 3.25 (1.607 m)   Wt 106 lb (48.1 kg)   SpO2 92%   BMI 18.63 kg/m   BP Readings from Last 3 Encounters:  11/09/23 118/80  07/05/23 112/70  04/18/23 120/80    Wt Readings from Last 3 Encounters:  11/09/23 106 lb (48.1 kg)  07/05/23 106 lb (48.1 kg)  06/28/23 105 lb (47.6 kg)    Physical Exam Constitutional:      General: She is not in acute distress.    Appearance: She is well-developed.  HENT:     Head: Normocephalic.     Right Ear: External ear normal.     Left Ear: External ear normal.     Nose: Nose normal.  Eyes:     General:        Right eye: No discharge.        Left eye: No discharge.     Conjunctiva/sclera: Conjunctivae normal.     Pupils: Pupils are equal, round, and reactive to light.  Neck:     Thyroid : No thyromegaly.     Vascular: No JVD.     Trachea: No tracheal deviation.  Cardiovascular:     Rate and Rhythm: Normal rate and regular rhythm.     Heart sounds: Normal heart sounds.  Pulmonary:     Effort: No respiratory distress.     Breath sounds: No  stridor. No wheezing.  Abdominal:     General: Bowel sounds are normal. There is no distension.     Palpations: Abdomen is soft. There is no mass.     Tenderness: There is no abdominal tenderness. There is no guarding or rebound.  Musculoskeletal:        General: No tenderness.     Cervical back: Normal range of motion and neck supple. No rigidity.  Lymphadenopathy:     Cervical: No cervical adenopathy.  Skin:    Findings: No erythema or rash.  Neurological:     Mental Status: She is oriented to person, place, and time.     Cranial Nerves: No cranial nerve deficit.     Motor: No abnormal muscle tone.     Coordination: Coordination normal.     Gait: Gait normal.     Deep Tendon Reflexes: Reflexes normal.  Psychiatric:        Behavior: Behavior normal.        Thought Content: Thought content normal.        Judgment: Judgment normal.      Lab Results  Component Value Date   WBC 8.0 11/03/2022   HGB 12.8 11/03/2022   HCT 39.3 11/03/2022   PLT 304.0 11/03/2022   GLUCOSE 100 (H) 04/18/2023   CHOL 126 11/03/2022   TRIG 129.0 11/03/2022   HDL 59.50 11/03/2022   LDLCALC 41 11/03/2022   ALT 20 04/18/2023   AST 23 04/18/2023   NA 137 04/18/2023   K 4.2 04/18/2023   CL 99 04/18/2023   CREATININE 0.71 04/18/2023   BUN 11 04/18/2023   CO2 30 04/18/2023   TSH 3.61 11/03/2022    No results found.  Assessment & Plan:   Problem List Items Addressed This Visit     Coronary atherosclerosis   Relevant Medications   metoprolol  succinate (TOPROL -XL) 25 MG 24 hr tablet   rosuvastatin  (CRESTOR ) 5 MG tablet   Dyslipidemia   Relevant Medications   rosuvastatin  (CRESTOR ) 5 MG tablet   Other Relevant Orders   TSH   Lipid panel   Well adult exam   We discussed age appropriate health related issues, including available/recomended screening tests and vaccinations. Labs were ordered to be later reviewed . All questions were answered. We discussed one or more of the following - seat belt use, use of sunscreen/sun exposure exercise, fall risk reduction, second hand smoke exposure, firearm use and storage, seat belt use, a need for adhering to healthy diet and exercise. Labs were ordered.  All questions were answered. 2020 CT ca scoring test - Coronary calcium  score of 217.  Last colon 2019. We can do Cologuard test Balance exercises      Relevant Orders   TSH   Urinalysis   CBC with Differential/Platelet   Lipid panel   Comprehensive metabolic panel with GFR   Other Visit Diagnoses       Screening for colon cancer    -  Primary   Relevant Orders   CBC with Differential/Platelet   Cologuard         Meds ordered this encounter  Medications   metoprolol  succinate (TOPROL -XL) 25 MG 24 hr tablet    Sig: Take 1 tablet (25 mg total) by mouth at bedtime.    Dispense:  90 tablet    Refill:  3   rosuvastatin   (CRESTOR ) 5 MG tablet    Sig: Take 0.5 tablets (2.5 mg total) by mouth daily.    Dispense:  45  tablet    Refill:  3    Please send a replace/new response with 90-Day Supply if appropriate to maximize member benefit. Requesting 1 year supply.      Follow-up: Return in about 6 months (around 05/11/2024) for a follow-up visit.  Marolyn Noel, MD

## 2023-11-09 NOTE — Assessment & Plan Note (Signed)
 We discussed age appropriate health related issues, including available/recomended screening tests and vaccinations. Labs were ordered to be later reviewed . All questions were answered. We discussed one or more of the following - seat belt use, use of sunscreen/sun exposure exercise, fall risk reduction, second hand smoke exposure, firearm use and storage, seat belt use, a need for adhering to healthy diet and exercise. Labs were ordered.  All questions were answered. 2020 CT ca scoring test - Coronary calcium  score of 217.  Last colon 2019. We can do Cologuard test Balance exercises

## 2023-11-13 ENCOUNTER — Ambulatory Visit: Payer: Self-pay | Admitting: Internal Medicine

## 2023-11-19 LAB — COLOGUARD: COLOGUARD: NEGATIVE

## 2023-11-24 ENCOUNTER — Ambulatory Visit (INDEPENDENT_AMBULATORY_CARE_PROVIDER_SITE_OTHER)

## 2023-11-24 DIAGNOSIS — Z23 Encounter for immunization: Secondary | ICD-10-CM | POA: Diagnosis not present

## 2023-11-24 NOTE — Progress Notes (Signed)
 Flu vaccine was given and patient responded well to it there were no concerns

## 2023-12-12 ENCOUNTER — Telehealth: Payer: Self-pay

## 2023-12-12 ENCOUNTER — Other Ambulatory Visit (HOSPITAL_COMMUNITY): Payer: Self-pay

## 2023-12-12 NOTE — Telephone Encounter (Signed)
 Prolia  VOB initiated via [email or web address]  Next Prolia  inj DUE: 01/10/24

## 2023-12-12 NOTE — Telephone Encounter (Signed)
 Joy Thompson

## 2023-12-12 NOTE — Telephone Encounter (Signed)
 Pt ready for scheduling for PROLIA  on or after : 01/10/24  Option# 1: Buy/Bill (Office supplied medication)  Out-of-pocket cost due at time of clinic visit: $357  Number of injection/visits approved: 2  Primary: UHC-MEDICARE Prolia  co-insurance: 20% Admin fee co-insurance: 20%  Secondary: --- Prolia  co-insurance:  Admin fee co-insurance:   Medical Benefit Details: Date Benefits were checked: 12/12/23 Deductible: $190 Met of $190 Required/ Coinsurance: 20%/ Admin Fee: 20%  Prior Auth: APPROVED PA# [id number] Expiration Date: 04/18/23-04/17/24  # of doses approved: 2 ----------------------------------------------------------------------- Option# 2- Med Obtained from pharmacy: Prolia  is no longer preferred for pharmacy benefit. [facility] is now preferred. PRICING IS FOR [facility]TI  Pharmacy benefit: Copay $633.99 (Paid to pharmacy) Admin Fee: 20% (Pay at clinic)  Prior Auth: N/A PA# Expiration Date:   # of doses approved:   If patient wants fill through the pharmacy benefit please send prescription to: [facility], and include estimated need by date in rx notes. Pharmacy will ship medication directly to the office.  Patient NOT eligible for Prolia  Copay Card. Copay Card can make patient's cost as little as $25. Link to apply: [email or web address]://www.amgensupportplus.com/copay  ** This summary of benefits is an estimation of the patient's out-of-pocket cost. Exact cost may very based on individual plan coverage.

## 2023-12-19 ENCOUNTER — Ambulatory Visit

## 2023-12-28 LAB — HM MAMMOGRAPHY

## 2024-01-08 ENCOUNTER — Other Ambulatory Visit: Payer: Self-pay | Admitting: Internal Medicine

## 2024-01-16 ENCOUNTER — Ambulatory Visit

## 2024-01-16 DIAGNOSIS — M810 Age-related osteoporosis without current pathological fracture: Secondary | ICD-10-CM

## 2024-01-16 MED ORDER — DENOSUMAB 60 MG/ML ~~LOC~~ SOSY: 60.0000 mg | PREFILLED_SYRINGE | Freq: Once | SUBCUTANEOUS | Status: AC

## 2024-01-16 NOTE — Progress Notes (Addendum)
 Pt was given Prolia  injection with no complications.  Medical screening examination/treatment/procedure(s) were performed by non-physician practitioner and as supervising physician I was immediately available for consultation/collaboration.  I agree with above. Karlynn Noel, MD

## 2024-05-14 ENCOUNTER — Ambulatory Visit: Admitting: Internal Medicine

## 2024-06-28 ENCOUNTER — Ambulatory Visit

## 2024-07-24 ENCOUNTER — Ambulatory Visit
